# Patient Record
Sex: Female | Born: 1945 | Race: White | Hispanic: No | Smoking: Never smoker
Health system: Southern US, Community
[De-identification: ages and names within clinical notes are randomized; demographics above are authoritative.]

## PROBLEM LIST (undated history)

## (undated) DIAGNOSIS — M549 Dorsalgia, unspecified: Secondary | ICD-10-CM

## (undated) DIAGNOSIS — M2550 Pain in unspecified joint: Secondary | ICD-10-CM

## (undated) DIAGNOSIS — R112 Nausea with vomiting, unspecified: Secondary | ICD-10-CM

## (undated) DIAGNOSIS — E785 Hyperlipidemia, unspecified: Secondary | ICD-10-CM

## (undated) DIAGNOSIS — M1990 Unspecified osteoarthritis, unspecified site: Secondary | ICD-10-CM

## (undated) DIAGNOSIS — Z973 Presence of spectacles and contact lenses: Secondary | ICD-10-CM

## (undated) DIAGNOSIS — I4891 Unspecified atrial fibrillation: Secondary | ICD-10-CM

## (undated) DIAGNOSIS — R0602 Shortness of breath: Secondary | ICD-10-CM

## (undated) DIAGNOSIS — G4730 Sleep apnea, unspecified: Secondary | ICD-10-CM

## (undated) DIAGNOSIS — I480 Paroxysmal atrial fibrillation: Secondary | ICD-10-CM

## (undated) DIAGNOSIS — I10 Essential (primary) hypertension: Secondary | ICD-10-CM

## (undated) DIAGNOSIS — R5383 Other fatigue: Secondary | ICD-10-CM

## (undated) DIAGNOSIS — Z9889 Other specified postprocedural states: Secondary | ICD-10-CM

## (undated) DIAGNOSIS — M545 Low back pain, unspecified: Secondary | ICD-10-CM

## (undated) DIAGNOSIS — F419 Anxiety disorder, unspecified: Secondary | ICD-10-CM

## (undated) DIAGNOSIS — K5900 Constipation, unspecified: Secondary | ICD-10-CM

## (undated) DIAGNOSIS — G4733 Obstructive sleep apnea (adult) (pediatric): Principal | ICD-10-CM

## (undated) HISTORY — DX: Essential (primary) hypertension: I10

## (undated) HISTORY — DX: Shortness of breath: R06.02

## (undated) HISTORY — DX: Unspecified atrial fibrillation: I48.91

## (undated) HISTORY — DX: Sleep apnea, unspecified: G47.30

## (undated) HISTORY — DX: Obstructive sleep apnea (adult) (pediatric): G47.33

## (undated) HISTORY — PX: BUNIONECTOMY: SHX129

## (undated) HISTORY — DX: Unspecified osteoarthritis, unspecified site: M19.90

## (undated) HISTORY — DX: Hyperlipidemia, unspecified: E78.5

## (undated) HISTORY — DX: Pain in unspecified joint: M25.50

## (undated) HISTORY — DX: Dorsalgia, unspecified: M54.9

## (undated) HISTORY — PX: HAMMER TOE SURGERY: SHX385

## (undated) HISTORY — PX: COLONOSCOPY: SHX174

## (undated) HISTORY — DX: Low back pain, unspecified: M54.50

## (undated) HISTORY — DX: Other fatigue: R53.83

## (undated) HISTORY — DX: Anxiety disorder, unspecified: F41.9

## (undated) HISTORY — DX: Constipation, unspecified: K59.00

## (undated) HISTORY — DX: Paroxysmal atrial fibrillation: I48.0

---

## 1990-10-03 HISTORY — PX: CARPAL TUNNEL RELEASE: SHX101

## 1999-02-15 ENCOUNTER — Other Ambulatory Visit: Admission: RE | Admit: 1999-02-15 | Discharge: 1999-02-15 | Payer: Self-pay | Admitting: Gynecology

## 1999-03-29 ENCOUNTER — Other Ambulatory Visit: Admission: RE | Admit: 1999-03-29 | Discharge: 1999-03-29 | Payer: Self-pay | Admitting: Obstetrics and Gynecology

## 1999-07-03 ENCOUNTER — Emergency Department (HOSPITAL_COMMUNITY): Admission: EM | Admit: 1999-07-03 | Discharge: 1999-07-03 | Payer: Self-pay

## 2001-04-27 ENCOUNTER — Other Ambulatory Visit: Admission: RE | Admit: 2001-04-27 | Discharge: 2001-04-27 | Payer: Self-pay | Admitting: Obstetrics and Gynecology

## 2002-05-07 ENCOUNTER — Other Ambulatory Visit: Admission: RE | Admit: 2002-05-07 | Discharge: 2002-05-07 | Payer: Self-pay | Admitting: Obstetrics and Gynecology

## 2003-04-01 ENCOUNTER — Ambulatory Visit (HOSPITAL_BASED_OUTPATIENT_CLINIC_OR_DEPARTMENT_OTHER): Admission: RE | Admit: 2003-04-01 | Discharge: 2003-04-02 | Payer: Self-pay | Admitting: Dentistry

## 2003-10-04 HISTORY — PX: FOOT SURGERY: SHX648

## 2004-06-02 ENCOUNTER — Other Ambulatory Visit: Admission: RE | Admit: 2004-06-02 | Discharge: 2004-06-02 | Payer: Self-pay | Admitting: Obstetrics and Gynecology

## 2005-03-25 ENCOUNTER — Encounter: Admission: RE | Admit: 2005-03-25 | Discharge: 2005-03-25 | Payer: Self-pay | Admitting: Internal Medicine

## 2005-05-10 ENCOUNTER — Encounter: Admission: RE | Admit: 2005-05-10 | Discharge: 2005-05-10 | Payer: Self-pay | Admitting: Internal Medicine

## 2005-08-30 ENCOUNTER — Ambulatory Visit (HOSPITAL_COMMUNITY): Admission: RE | Admit: 2005-08-30 | Discharge: 2005-08-30 | Payer: Self-pay | Admitting: Orthopedic Surgery

## 2005-08-30 ENCOUNTER — Ambulatory Visit (HOSPITAL_BASED_OUTPATIENT_CLINIC_OR_DEPARTMENT_OTHER): Admission: RE | Admit: 2005-08-30 | Discharge: 2005-08-30 | Payer: Self-pay | Admitting: Orthopedic Surgery

## 2005-10-03 HISTORY — PX: CARPAL TUNNEL RELEASE: SHX101

## 2006-10-06 ENCOUNTER — Other Ambulatory Visit: Admission: RE | Admit: 2006-10-06 | Discharge: 2006-10-06 | Payer: Self-pay | Admitting: Obstetrics and Gynecology

## 2006-11-27 ENCOUNTER — Encounter: Admission: RE | Admit: 2006-11-27 | Discharge: 2006-11-27 | Payer: Self-pay | Admitting: Family Medicine

## 2008-05-06 ENCOUNTER — Other Ambulatory Visit: Admission: RE | Admit: 2008-05-06 | Discharge: 2008-05-06 | Payer: Self-pay | Admitting: Obstetrics and Gynecology

## 2009-04-09 ENCOUNTER — Ambulatory Visit: Payer: Self-pay | Admitting: Internal Medicine

## 2009-04-23 ENCOUNTER — Ambulatory Visit: Payer: Self-pay | Admitting: Internal Medicine

## 2009-04-23 ENCOUNTER — Encounter: Payer: Self-pay | Admitting: Internal Medicine

## 2009-04-24 ENCOUNTER — Encounter: Payer: Self-pay | Admitting: Internal Medicine

## 2011-02-18 NOTE — Op Note (Signed)
[name], [name]               ACCOUNT NO.:  [id number]   MEDICAL RECORD NO.:  [id number]          PATIENT TYPE:  AMB   LOCATION:  DSC                          FACILITY:  [facility]   PHYSICIAN:  Katy Fitch. Sypher, M.D. DATE OF BIRTH:  08/13/1946   DATE OF PROCEDURE:  08/30/2005  DATE OF DISCHARGE:                                 OPERATIVE REPORT   PREOPERATIVE DIAGNOSIS:  Entrapment neuropathy, left median nerve at carpal  tunnel.   POSTOPERATIVE DIAGNOSES:  1.  Entrapment neuropathy, left median nerve at carpal tunnel.  2.  Identification of significant synovitis.   OPERATION:  Release of left transverse carpal ligament.   OPERATIONS:  Katy Fitch. Sypher, M.D.   ASSISTANT:  Molly Maduro Dasnoit PA-C.   ANESTHESIA:  General by LMA, supervising anesthesiologist is Dr. Gelene Mink.   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  She is status post right carpal  tunnel release 15 years prior.  Recently she developed numbness in the left  hand and some issues with her cervical spine.   She was evaluated by Dr. 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   She had electrodiagnostic studies that confirmed left carpal tunnel  syndrome.   She was referred back for definitive care of her left carpal tunnel  syndrome.   PROCEDURE:  [name] is brought to the operating room and placed in  supine position upon the operating table.   Following the induction of general anesthesia by LMA technique, the left arm  was prepped with Betadine soap and solution and sterilely draped.   Following exsanguination of the left arm with an Esmarch bandage, the  arterial tourniquet on the proximal brachium was inflated to 230 mmHg.   The procedure commenced with a short incision in the line the ring finger in  the palm.  The subcutaneous tissues were  carefully divided to reveal the  palmar fascia.  This was split longitudinally to reveal the common sensory  branch of the median nerve.   These were followed back to the transverse carpal ligament, which was gently  sounded with a Pen[place] 4 elevator, followed by release of the ligament  along its ulnar border extending into the distal forearm.   This widely opened the carpal canal.   Bleeding points along margin of the released ligament were cauterized with  bipolar current, followed by repair of the skin with intradermal 3-0  Prolene.   A compressive dressing was applied with a volar plaster splint maintaining  the wrist in 5 degrees of dorsiflexion.   There were no apparent complications.   Felicia Franklin was noted to have a remarkable amount of synovial fluid within  the canal, suggesting an inflammatory synovitis.   We will provide her with this information postoperatively and encourage her  to seek further evaluation for possible rheumatologic predicament.      Katy Fitch Sypher, M.D.  Electronically Signed     RVS/MEDQ  D:  08/30/2005  T:  08/31/2005  Job:  [id number]

## 2011-02-18 NOTE — Op Note (Signed)
NAME:  Felicia Franklin, Felicia Franklin                         ACCOUNT NO.:  [id number]   MEDICAL RECORD NO.:  [id number]                   PATIENT TYPE:  AMB   LOCATION:  DSC                                  FACILITY:  [facility]   PHYSICIAN:  Leonides Grills, M.D.                  DATE OF BIRTH:  Feb 02, 1946   DATE OF PROCEDURE:  04/01/2003  DATE OF DISCHARGE:                                 OPERATIVE REPORT   PREOPERATIVE DIAGNOSES:  1. Left hallux valgus.  2. Left tight gastrocnemius.  3. Left second and third metatarsalgia.  4. Left second and third acquired hammertoes.   POSTOPERATIVE DIAGNOSES:  1. Left hallux valgus.  2. Left tight gastrocnemius.  3. Left second and third metatarsalgia.  4. Left second and third acquired hammertoes.   OPERATION:  1. Left chevron bunionectomy.  2. Left gastrocnemius release.  3. Left second Wilde shortening osteotomy.  4. Left third Wilde shortening osteotomy.  5. Left second and third toes metatarsal phalangeal joint dorsal     capsulotomies with collateral release.  6. Left second and third toes partial phalanx head resections.  7. Left second and third toes flexor digitorum longus to proximal phalanx     transfers.  8. Left second and third toes extensor digitorum brevis to extensor     digitorum longus transfers.   ANESTHESIA:  General endotracheal tube.   SURGEON:  Leonides Grills, M.D.   ASSISTANT:  Lianne Cure, P.A.-C.   ESTIMATED BLOOD LOSS:  Minimal.   TOURNIQUET TIME:  Approximately two hours.   COMPLICATIONS:  None.   DISPOSITION:  Stable to the postoperative recovery room.   INDICATIONS FOR PROCEDURE:  This is a 65 year old female who has had  longstanding forefoot pain that is interfering with her life, __________  despite conservative management.  She has consented for the above procedure.  All risks, which include infection, nerve or vessel injury, non-union,  malunion, hardware rotation, hardware failure, persistent pain,  worsening  pain, avascular necrosis, stiffness, arthritis, and possible fusion and  recurrence and deformity were all explained.  Questions were encouraged and  answered.   DESCRIPTION OF PROCEDURE:  The patient was brought to the operating room and  placed in the supine position. after adequate general endotracheal tube  anesthesia was administered, as well as Ancef 1 g IV piggyback.  The left  lower extremity was then prepped and draped in a sterile manner over a  proximally placed thigh tourniquet.  We started the procedure with a  longitudinal incision over the medial gastrocnemius muscle/tendinous  junction.  Dissection was carried down through the skin.  Hemostasis was  obtained.  The fascia was opened in line with the incision.  The conjoined  region between the gastrocnemius and soleus was then developed and soft  tissue was then elevated off of the posterior aspect of the gastrocnemius.  The sural nerve was identified and protected posteriorly.  Then with the  Mayo scissors, the gastrocnemius tendon was then released.  This had an  excellent release of the tight gastrocnemius.  The wound was copiously  irrigated with normal saline.  The subcutaneous was closed with #3-0 Vicryl.  The skin was closed with #4-0 Monocryl subcuticular stitch.  Steri-Strips  were applied.   We then gravity exsanguinated the left lower extremity.  The tourniquet was  elevated to 290 mmHg.  A longitudinal incision in the midline over the left  great toe MTP joint was then made.  Dissection was carried down through the  skin.  The neurovascular structures were identified and dissected out both  superiorly and inferiorly and protected throughout the remaining portion of  the procedure.  An L-shaped medial capsulotomy was then made.  Once this was  elevated, a simple bunionectomy was performed with the sagittal saw.  Once  this was done, the lateral capsule was then released through the joint using  a  Beaver blade.  Once this was done, we then found the center of the  metatarsal head medially and then a chevron osteotomy was made with 60  degrees between each limb.  Once this was done, and the soft tissue  protected throughout the osteotomy, the head was then translated laterally,  and then fixed with axial compression applied by my assistant with a 2.0 mm  fully-threaded cortical screw, using a 1.5 mm drill respectively, counter-  sinking the screw as well.  The redundant portion of the bone was then  removed with the sagittal saw, and the edges rounded off with the rongeur.  An x-ray was obtained and showed an excellent alignment and correction of  the bunion with sesamoids reduced.  We then advanced the capsule proximal  dorsal, and fixed this with #2-0 Vicryl, holding the toe in supination  varus.  Once this was done, the toe was in excellent alignment with an  excellent range of motion.  The sesamoids were reduced.   We then made a longitudinal incision over the second toe.  Dissection was  carried down through the skin.  Hemostasis was obtained.  The extensor  digitorum longus was tenotomized proximal medial, and the brevis distal  lateral, and retracted out of harm's way for later transfer.  The MTP joint  was then entered, and a dorsal capsulotomy with collateral release was then  performed meticulously with protection of the neurovascular structures both  medially and laterally.  The PIP joint was then entered, and collaterals  were released.  The proximal phalanx head was then skeletonized and excised  with the aid of a rongeur and a bone cutter.  We then went back to the MTP  joint, and with hyperflexion through the MTP joint which was very stiff, we  were able to perform a Wilde metatarsal shortening osteotomy with a stacked  sagittal saw.  Once this was done parallel to the plane of the foot, this  was then translated approximately 2 to 3 mm proximal, and fixed with a  [place] 12 mm screw.  This had excellent maintenance of the correction and  compression across the osteotomy.  The remaining ledge of bone was then  trimmed off with the rongeur.  We went back to the PIP joint.  A  longitudinal incision was then made within the PIP joint plantar plate.  The  FDL tendon was then identified and tenotomized as distal as possible.  Then  a 3.5 mm drill hole was placed in the  base of the proximal phalanx going  from dorsal to plantar.  We then through dorsal plantar threaded #3-0 PDS  suture, and then anchored this to the end of the FDL tendon.  This was then  pulled through the hole in the base of the proximal phalanx and then dorsal  plantar.  We then placed a double-ended trocar 4.5 K-wire antegrade through  the middle and distal phalanx, and then retrograde through the proximal  phalanx with the toe held reduced.  Then with the MTP joint held reduced,  the ankle in neutral dorsiflexion, and tension on the FDL tendon, the K-wire  was then fired across the MTP joint.  This had excellent purchase and  maintenance of the corrected toe.  We then transferred dorsally the EDP to  EDL with #3-0 PDS, as well as to the FDL tendon.   The same exact procedure through a separate incision was then performed on  the third toe.  At the end of the procedure the tourniquet was deflated.  All toes pinked up nicely.  Skin-relieving incisions were made around each K-  wire.  The K-wires were bent, cut, and capped.  Final x-rays were obtained  in the AP and lateral views and showed excellent alignment of the  osteotomies, as well as correction of the forefoot where it had balanced  configuration as well.  Once the toes pinked up nicely, we then closed all  the wounds with #4-0 nylon suture.  The toes were still maintained in pinked  position, bleeding at the tip of the toes as well, through the skin-  relieving incisions.  A sterile dressing was applied.  A Cam walker boot  was  applied.  The patient was stable to the postoperative recovery room.                                                 Leonides Grills, M.D.    PB/MEDQ  D:  04/01/2003  T:  04/01/2003  Job:  [id number]

## 2013-05-28 ENCOUNTER — Encounter (HOSPITAL_COMMUNITY): Payer: Self-pay | Admitting: Certified Registered Nurse Anesthetist

## 2013-05-28 ENCOUNTER — Encounter (HOSPITAL_COMMUNITY)
Admission: RE | Disposition: A | Discharge status: Home / Self Care | Payer: Self-pay | Source: Ambulatory Visit | Attending: Cardiology

## 2013-05-28 ENCOUNTER — Ambulatory Visit (HOSPITAL_COMMUNITY)
Admission: RE | Admit: 2013-05-28 | Discharge: 2013-05-28 | Disposition: A | Discharge status: Home / Self Care | Payer: Medicare Other | Source: Ambulatory Visit | Attending: Cardiology | Admitting: Cardiology

## 2013-05-28 ENCOUNTER — Ambulatory Visit: Admit: 2013-05-28 | Payer: Self-pay | Admitting: Cardiology

## 2013-05-28 DIAGNOSIS — I4891 Unspecified atrial fibrillation: Secondary | ICD-10-CM | POA: Insufficient documentation

## 2013-05-28 DIAGNOSIS — Z538 Procedure and treatment not carried out for other reasons: Secondary | ICD-10-CM | POA: Insufficient documentation

## 2013-05-28 SURGERY — CARDIOVERSION
Anesthesia: Monitor Anesthesia Care

## 2013-05-28 SURGERY — CANCELLED PROCEDURE

## 2013-06-05 ENCOUNTER — Encounter: Payer: Self-pay | Admitting: Pulmonary Disease

## 2013-06-06 ENCOUNTER — Encounter: Payer: Self-pay | Admitting: Pulmonary Disease

## 2013-06-06 ENCOUNTER — Ambulatory Visit (INDEPENDENT_AMBULATORY_CARE_PROVIDER_SITE_OTHER): Payer: Medicare Other | Admitting: Pulmonary Disease

## 2013-06-06 VITALS — BP 102/80 | HR 60 | Temp 97.3°F | Ht 63.5 in | Wt 211.4 lb

## 2013-06-06 DIAGNOSIS — R0683 Snoring: Secondary | ICD-10-CM

## 2013-06-06 DIAGNOSIS — G4733 Obstructive sleep apnea (adult) (pediatric): Secondary | ICD-10-CM | POA: Insufficient documentation

## 2013-06-06 DIAGNOSIS — R0609 Other forms of dyspnea: Secondary | ICD-10-CM

## 2013-06-06 NOTE — Patient Instructions (Signed)
Will arrange for sleep study Will call to arrange for follow up after sleep study reviewed 

## 2013-06-06 NOTE — Progress Notes (Signed)
Chief Complaint  Patient presents with  . SLEEP CONSULT    Referred by Dr Jacinto Halim. Epworth Score: 2    History of Present Illness: Felicia Franklin is a 67 y.o. female for evaluation of sleep problems.  She is followed by cardiology for hypertension and atrial fibrillation.  She has noted trouble falling asleep since she was started on eliquis.  She also snores while asleep, and feels tired during the day.  There was concern she could have sleep apnea, and she was referred for further assessment.  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  Since retirement her sleep/wake schedule has been more erratic.  She goes to sleep at between 10 and midnight.  She falls asleep quickly.  She wakes up several times to use the bathroom.  She gets out of bed at between 6 and 8 am.  She feels tired in the morning.  She denies morning headache.  She does not use anything to help her fall sleep or stay awake.  She has been told by her dentist that she grinds her teeth.  She drinks diet coke in the morning for the caffeine.  She notices problems with sinus congestion, but only at night.  As a result she has to breath through her mouth while she is asleep.  She denies sleep walking, sleep talking, or nightmares.  There is no history of restless legs.  She denies sleep hallucinations, sleep paralysis, or cataplexy.  The Epworth score is 2 out of 24.  Tests:   CRIMSON DUBBERLY  has a past medical history of Hyperlipidemia and Atrial fibrillation.  Clinton Quant  has past surgical history that includes Carpal tunnel release and Foot surgery (Left, 2005).  Prior to Admission medications   Medication Sig Start Date End Date Taking? Authorizing Provider  aliskiren (TEKTURNA) 300 MG tablet Take 300 mg by mouth daily.   Yes Historical Provider, MD  amLODipine (NORVASC) 10 MG tablet Take 10 mg by mouth daily.   Yes Historical Provider, MD  apixaban (ELIQUIS) 5 MG TABS tablet Take 5 mg by mouth 2 (two)  times daily.   Yes Historical Provider, MD  Misc Natural Products (OSTEO BI-FLEX JOINT SHIELD) TABS Take 1 tablet by mouth daily.   Yes Historical Provider, MD  Multiple Vitamins-Minerals (CENTRUM SILVER PO) Take 1 tablet by mouth daily.   Yes Historical Provider, MD  nebivolol (BYSTOLIC) 10 MG tablet Take 10 mg by mouth daily.   Yes Historical Provider, MD  sertraline (ZOLOFT) 100 MG tablet Take 100 mg by mouth daily.   Yes Historical Provider, MD  simvastatin (ZOCOR) 40 MG tablet Take 40 mg by mouth every evening.   Yes Historical Provider, MD  spironolactone (ALDACTONE) 25 MG tablet Take 25 mg by mouth daily.   Yes Historical Provider, MD  Vitamin D, Ergocalciferol, (DRISDOL) 50000 UNITS CAPS capsule Take 50,000 Units by mouth every 7 (seven) days.   Yes Historical Provider, MD    Allergies  Allergen Reactions  . Antifungal [Miconazole Nitrate] Rash    Her family history includes Leukemia in her father.  She  reports that she has never smoked. She has never used smokeless tobacco. She reports that  drinks alcohol. She reports that she does not use illicit drugs.  Review of Systems  Constitutional: Negative for fever and unexpected weight change.  HENT: Negative for ear pain, nosebleeds, congestion, sore throat, rhinorrhea, sneezing, trouble swallowing, dental problem, postnasal drip and sinus pressure.   Eyes: Negative  for redness and itching.  Respiratory: Positive for shortness of breath. Negative for cough, chest tightness and wheezing.   Cardiovascular: Positive for palpitations ( irregular heartbeats). Negative for leg swelling.  Gastrointestinal: Negative for nausea and vomiting.  Genitourinary: Negative for dysuria.  Musculoskeletal: Negative for joint swelling.  Skin: Negative for rash.  Neurological: Negative for headaches.  Hematological: Does not bruise/bleed easily.  Psychiatric/Behavioral: Negative for dysphoric mood. The patient is not nervous/anxious.    Physical  Exam:  General - No distress ENT - No sinus tenderness, no oral exudate, MP 3, scalloped tongue, 3+ tonsil, no LAN, no thyromegaly, TM clear, pupils equal/reactive Cardiac - s1s2 regular, no murmur, pulses symmetric Chest - No wheeze/rales/dullness, good air entry, normal respiratory excursion Back - No focal tenderness Abd - Soft, non-tender, no organomegaly, + bowel sounds Ext - No edema Neuro - Normal strength, cranial nerves intact Skin - No rashes Psych - Normal mood, and behavior

## 2013-06-06 NOTE — Assessment & Plan Note (Signed)
She has snoring, sleep disruption, and daytime sleepiness.  She has history of hypertension and atrial fibrillation.  She has recent overnight oximetry showing oxygen desaturation.  I am concerned she could have sleep apnea.  We discussed how sleep apnea can affect various health problems including risks for hypertension, cardiovascular disease, and diabetes.  We also discussed how sleep disruption can increase risks for accident, such as while driving.  Weight loss as a means of improving sleep apnea was also reviewed.  Additional treatment options discussed were CPAP therapy, oral appliance, and surgical intervention.  To further assess will arrange for in lab sleep study.

## 2013-06-06 NOTE — Progress Notes (Deleted)
  Subjective:    Patient ID: Felicia Franklin, female    DOB: [birth year], 67 y.o.   MRN: [id number]  HPI    Review of Systems  Constitutional: Negative for fever and unexpected weight change.  HENT: Negative for ear pain, nosebleeds, congestion, sore throat, rhinorrhea, sneezing, trouble swallowing, dental problem, postnasal drip and sinus pressure.   Eyes: Negative for redness and itching.  Respiratory: Positive for shortness of breath. Negative for cough, chest tightness and wheezing.   Cardiovascular: Positive for palpitations ( irregular heartbeats). Negative for leg swelling.  Gastrointestinal: Negative for nausea and vomiting.  Genitourinary: Negative for dysuria.  Musculoskeletal: Negative for joint swelling.  Skin: Negative for rash.  Neurological: Negative for headaches.  Hematological: Does not bruise/bleed easily.  Psychiatric/Behavioral: Negative for dysphoric mood. The patient is not nervous/anxious.        Objective:   Physical Exam        Assessment & Plan:

## 2013-06-11 ENCOUNTER — Encounter: Payer: Self-pay | Admitting: Pulmonary Disease

## 2013-07-10 ENCOUNTER — Ambulatory Visit
Admission: RE | Admit: 2013-07-10 | Discharge: 2013-07-10 | Disposition: A | Discharge status: Home / Self Care | Payer: Medicare Other | Source: Ambulatory Visit | Attending: Pain Medicine | Admitting: Pain Medicine

## 2013-07-10 ENCOUNTER — Other Ambulatory Visit: Payer: Self-pay | Admitting: Pain Medicine

## 2013-07-10 DIAGNOSIS — M25552 Pain in left hip: Secondary | ICD-10-CM

## 2013-07-10 DIAGNOSIS — M25551 Pain in right hip: Secondary | ICD-10-CM

## 2013-07-11 ENCOUNTER — Ambulatory Visit (HOSPITAL_BASED_OUTPATIENT_CLINIC_OR_DEPARTMENT_OTHER): Payer: Medicare Other | Attending: Pulmonary Disease

## 2013-07-11 VITALS — Ht 63.0 in | Wt 211.0 lb

## 2013-07-11 DIAGNOSIS — G4733 Obstructive sleep apnea (adult) (pediatric): Secondary | ICD-10-CM | POA: Insufficient documentation

## 2013-07-11 DIAGNOSIS — I10 Essential (primary) hypertension: Secondary | ICD-10-CM | POA: Insufficient documentation

## 2013-07-11 DIAGNOSIS — G4763 Sleep related bruxism: Secondary | ICD-10-CM | POA: Insufficient documentation

## 2013-07-11 DIAGNOSIS — I4891 Unspecified atrial fibrillation: Secondary | ICD-10-CM | POA: Insufficient documentation

## 2013-07-11 DIAGNOSIS — R0683 Snoring: Secondary | ICD-10-CM

## 2013-08-01 ENCOUNTER — Telehealth: Payer: Self-pay | Admitting: Pulmonary Disease

## 2013-08-01 NOTE — Telephone Encounter (Signed)
I do not see results in Dr Craige Cotta look at.  Results requested From [facility]-- faxed to [phone] ATTN: [place]/Dr [facility], raw data to be faxed to be read by Dr Craige Cotta.   Will forward to [place] to keep an eye out for these results.

## 2013-08-01 NOTE — Telephone Encounter (Signed)
Results received-- I have spoken with Florentina Addison about getting CY to result sleep study seeing that VS out of office and pt is requesting.  Florentina Addison has sleep study on her desk.   Will forward to Katie to follow up on and advise. Thanks.

## 2013-08-01 NOTE — Telephone Encounter (Signed)
I will keep a look out for the results. When they are received I will see if CY doesn't mind looking at them.

## 2013-08-01 NOTE — Telephone Encounter (Signed)
Pt aware results are being faxed.  Apologized for the delay.   Lillia Abed, since Dr Craige Cotta not back in office until 08/15/13, we may have to get another sleep physician to read these ASAP for pt is anxious. Thanks!

## 2013-08-01 NOTE — Telephone Encounter (Signed)
According to the schedule VS is not off just out of the office; I have given the sleep study (with approval from CY as well) to Lillia Abed so she can reach VS about this patient. There is not an interpretation in the computer to go by at this time.

## 2013-08-01 NOTE — Telephone Encounter (Signed)
I have paged Dr. Craige Cotta to address this. When he does, I will contact the pt with her results.

## 2013-08-05 DIAGNOSIS — R0609 Other forms of dyspnea: Secondary | ICD-10-CM

## 2013-08-05 DIAGNOSIS — R0989 Other specified symptoms and signs involving the circulatory and respiratory systems: Secondary | ICD-10-CM

## 2013-08-05 NOTE — Telephone Encounter (Signed)
Can you please address this pt's sleep study. Thanks.

## 2013-08-05 NOTE — Telephone Encounter (Signed)
PSG 07/11/13 >> AHI 22.8, SpO2 82%, PLMI 9.6.  Will have my nurse inform pt that sleep study shows moderate sleep apnea.  Option at this time is : 1) arrange for auto CPAP and follow up in 2 months to assess response to therapy, or 2) arrange for follow up first to discuss results in more detail before starting therapy.  If pt is agreeable to start therapy, then send order to arrange for auto CPAP range 5 to 15 cm H2O with heated humidity and mask of choice.

## 2013-08-06 NOTE — Telephone Encounter (Signed)
Pt is aware of sleep study results. She has some questions before she agrees to use the CPAP machine. ROV has been scheduled for 08/23/13 at 10am.

## 2013-08-06 NOTE — Procedures (Signed)
NAME:  Felicia Franklin, VANALSTINE NO.:  [id number]  MEDICAL RECORD NO.:  [id number]          PATIENT TYPE:  OUT  LOCATION:  SLEEP CENTER                 FACILITY:  [facility]  PHYSICIAN:  Coralyn Helling, MD        DATE OF BIRTH:  October 01, 1946  DATE OF STUDY:  07/11/2013                           NOCTURNAL POLYSOMNOGRAM  REFERRING PHYSICIAN:  Coralyn Helling, MD  INDICATION FOR STUDY:  [name] is a 67 year old female, who has a history of hypertension and atrial fibrillation.  She also has symptoms of snoring, sleep disruption, and daytime sleepiness.  She recently had a overnight oximetry done which showed a significant oxygen desaturation.  She is referred to the sleep lab for further evaluation of hypersomnia with obstructive sleep apnea.  Height is 5 feet 3 inches, weight is 211 pounds, BMI is 37, neck size is 14 inches.  EPWORTH SLEEPINESS SCORE:  2.  MEDICATIONS:  Reviewed in her chart.  SLEEP ARCHITECTURE:  Total recording time was 402 minutes. Total sleep time was 249 minutes.  Sleep efficiency was 62%. Sleep latency was 67 minutes. REM latency was 294 minutes.  The patient was observed in all stages of sleep and slept predominantly in the non-supine position.  She appeared to have difficulty with sleep initiation and sleep maintenance due to respiratory events.  RESPIRATORY DATA:  The average respiratory rate was 16.  Loud snoring was noted by the technician.  The overall apnea/hypopnea index was 22.8. There were 10 central apneic events.  There was 1 mixed respiratory event.  The remainder of the events were obstructive in nature.  Her REM apnea-hypopnea index was 60. Her non-REM apnea-hypopnea index was 20.5.  OXYGEN DATA:  The baseline oxygenation was 93%.  The oxygen saturation nadir was 82%.  The study was conducted without the use of supplemental oxygen.  CARDIAC DATA:  The average heart rate was 66 and the rhythm strip showed atrial  fibrillation.  MOVEMENT-PARASOMNIA:  The periodic limb movement index was 9.6.  The patient was noted to have 1 episode of bruxism.  The patient had 1 restroom trip.  IMPRESSIONS-RECOMMENDATIONS:  This study shows evidence for moderate obstructive sleep apnea with an apnea/hypopnea index of 22.8, and oxygen saturation nadir of 82%.  She had 1 episode of bruxism noted.  In addition, a weight reduction and additional therapeutic interventions could include CPAP therapy, oral appliance, or surgical intervention.     Coralyn Helling, MD Diplomat, American Board of Sleep Medicine    VS/MEDQ  D:  08/05/2013 12:24:38  T:  08/06/2013 02:47:22  Job:  [id number]

## 2013-08-23 ENCOUNTER — Encounter: Payer: Self-pay | Admitting: Pulmonary Disease

## 2013-08-23 ENCOUNTER — Ambulatory Visit (INDEPENDENT_AMBULATORY_CARE_PROVIDER_SITE_OTHER): Payer: Medicare Other | Admitting: Pulmonary Disease

## 2013-08-23 VITALS — BP 122/64 | HR 60 | Ht 63.0 in | Wt 205.0 lb

## 2013-08-23 DIAGNOSIS — G4733 Obstructive sleep apnea (adult) (pediatric): Secondary | ICD-10-CM

## 2013-08-23 MED ORDER — ESZOPICLONE 1 MG PO TABS: 1.0000 mg | ORAL_TABLET | Freq: Every evening | ORAL | Status: DC | PRN

## 2013-08-23 NOTE — Assessment & Plan Note (Signed)
She has moderate sleep apnea.  She has history of hypertension and atrial fibrillation.  I have reviewed the recent sleep study results with the patient.  We discussed how sleep apnea can affect various health problems including risks for hypertension, cardiovascular disease, and diabetes.  We also discussed how sleep disruption can increase risks for accident, such as while driving.  Weight loss as a means of improving sleep apnea was also reviewed.  Additional treatment options discussed were CPAP therapy, oral appliance, and surgical intervention.  She is concerned about whether she can tolerate CPAP, but is willing to try this. Will arrange for auto CPAP set up.  Have given her script for lunesta to use as needed to assist with initial CPAP set up >> explained that sleep aide is not long term medication.  Also discussed mask fit issues.  Emphasized that treating sleep apnea is important for blood pressure control and may help with treating atrial fibrillation.

## 2013-08-23 NOTE — Patient Instructions (Signed)
Will arrange for CPAP set up Can try using lunesta 1 mg nightly before sleeping while getting used to CPAP machine Follow up in 2 months

## 2013-08-23 NOTE — Progress Notes (Signed)
Chief Complaint  Patient presents with  . Sleep Apnea    Discuss sleep apnea therapy    History of Present Illness: Felicia Franklin is a 67 y.o. female with OSA.  She is here to review her sleep study results.  This showed moderate sleep apnea.   TESTS: PSG 07/11/13 >> AHI 22.8, SpO2 82%, PLMI 9.6.   Felicia Franklin  has a past medical history of Hyperlipidemia; Atrial fibrillation; and Hypertension.  Felicia Franklin  has past surgical history that includes Carpal tunnel release and Foot surgery (Left, 2005).  Prior to Admission medications   Medication Sig Start Date End Date Taking? Authorizing Provider  aliskiren (TEKTURNA) 300 MG tablet Take 300 mg by mouth daily.   Yes Historical Provider, MD  amLODipine (NORVASC) 10 MG tablet Take 10 mg by mouth daily.   Yes Historical Provider, MD  apixaban (ELIQUIS) 5 MG TABS tablet Take 5 mg by mouth 2 (two) times daily.   Yes Historical Provider, MD  Misc Natural Products (OSTEO BI-FLEX JOINT SHIELD) TABS Take 1 tablet by mouth daily.   Yes Historical Provider, MD  Multiple Vitamins-Minerals (CENTRUM SILVER PO) Take 1 tablet by mouth daily.   Yes Historical Provider, MD  nebivolol (BYSTOLIC) 10 MG tablet Take 10 mg by mouth daily.   Yes Historical Provider, MD  sertraline (ZOLOFT) 100 MG tablet Take 100 mg by mouth daily.   Yes Historical Provider, MD  simvastatin (ZOCOR) 40 MG tablet Take 40 mg by mouth every evening.   Yes Historical Provider, MD  spironolactone (ALDACTONE) 25 MG tablet Take 25 mg by mouth daily.   Yes Historical Provider, MD  Vitamin D, Ergocalciferol, (DRISDOL) 50000 UNITS CAPS capsule Take 50,000 Units by mouth every 7 (seven) days.   Yes Historical Provider, MD    Allergies  Allergen Reactions  . Antifungal [Miconazole Nitrate] Rash     Physical Exam:  General - No distress ENT - No sinus tenderness, no oral exudate, no LAN, MP 3, scalloped tongue, 3+ tonsil Cardiac - s1s2 regular, no murmur Chest - No  wheeze/rales/dullness Back - No focal tenderness Abd - Soft, non-tender Ext - No edema Neuro - Normal strength Skin - No rashes Psych - normal mood, and behavior   Assessment/Plan:  Coralyn Helling, MD [place] [facility]/Sleep Pager:  [phone]

## 2013-08-27 ENCOUNTER — Ambulatory Visit: Payer: Medicare Other | Attending: Neurosurgery

## 2013-08-27 DIAGNOSIS — R5381 Other malaise: Secondary | ICD-10-CM | POA: Insufficient documentation

## 2013-08-27 DIAGNOSIS — M545 Low back pain, unspecified: Secondary | ICD-10-CM | POA: Insufficient documentation

## 2013-08-27 DIAGNOSIS — IMO0001 Reserved for inherently not codable concepts without codable children: Secondary | ICD-10-CM | POA: Insufficient documentation

## 2013-09-02 ENCOUNTER — Ambulatory Visit: Payer: Medicare Other | Attending: Neurosurgery | Admitting: Physical Therapy

## 2013-09-02 DIAGNOSIS — M545 Low back pain, unspecified: Secondary | ICD-10-CM | POA: Insufficient documentation

## 2013-09-02 DIAGNOSIS — R5381 Other malaise: Secondary | ICD-10-CM | POA: Insufficient documentation

## 2013-09-02 DIAGNOSIS — IMO0001 Reserved for inherently not codable concepts without codable children: Secondary | ICD-10-CM | POA: Insufficient documentation

## 2013-09-04 ENCOUNTER — Ambulatory Visit: Payer: Medicare Other | Admitting: Physical Therapy

## 2013-09-09 ENCOUNTER — Ambulatory Visit: Payer: Medicare Other | Admitting: Physical Therapy

## 2013-09-16 ENCOUNTER — Ambulatory Visit: Payer: Medicare Other

## 2013-09-30 ENCOUNTER — Ambulatory Visit: Payer: Medicare Other | Admitting: Physical Therapy

## 2013-10-02 ENCOUNTER — Ambulatory Visit: Payer: Medicare Other | Admitting: Physical Therapy

## 2013-10-07 ENCOUNTER — Encounter: Payer: Self-pay | Admitting: Obstetrics and Gynecology

## 2013-10-07 ENCOUNTER — Telehealth: Payer: Self-pay | Admitting: Obstetrics and Gynecology

## 2013-10-07 ENCOUNTER — Ambulatory Visit: Payer: Self-pay | Admitting: Obstetrics and Gynecology

## 2013-10-07 ENCOUNTER — Ambulatory Visit: Payer: Medicare Other | Attending: Neurosurgery | Admitting: Physical Therapy

## 2013-10-07 DIAGNOSIS — I10 Essential (primary) hypertension: Secondary | ICD-10-CM | POA: Insufficient documentation

## 2013-10-07 DIAGNOSIS — R5381 Other malaise: Secondary | ICD-10-CM | POA: Insufficient documentation

## 2013-10-07 DIAGNOSIS — IMO0001 Reserved for inherently not codable concepts without codable children: Secondary | ICD-10-CM | POA: Insufficient documentation

## 2013-10-07 DIAGNOSIS — F419 Anxiety disorder, unspecified: Secondary | ICD-10-CM | POA: Insufficient documentation

## 2013-10-07 DIAGNOSIS — M545 Low back pain, unspecified: Secondary | ICD-10-CM | POA: Insufficient documentation

## 2013-10-07 NOTE — Telephone Encounter (Signed)
No

## 2013-10-07 NOTE — Telephone Encounter (Signed)
As i was [facility] she came in at 3:55 thinking her appt was at 3:45. She went upstairs by mistake. I told her she would have to reschedule and nothing for Edward JollySilva was available until February. York SpanielSaid it was okay to see another provider for her scheduled tomorrow with Farrel GobbleLathrop. Do you want to [facility] fee?

## 2013-10-08 ENCOUNTER — Ambulatory Visit (INDEPENDENT_AMBULATORY_CARE_PROVIDER_SITE_OTHER): Payer: Medicare Other | Admitting: Gynecology

## 2013-10-08 ENCOUNTER — Encounter: Payer: Self-pay | Admitting: Gynecology

## 2013-10-08 VITALS — BP 149/75 | HR 64 | Resp 18 | Ht 62.0 in | Wt 209.0 lb

## 2013-10-08 DIAGNOSIS — I480 Paroxysmal atrial fibrillation: Secondary | ICD-10-CM | POA: Insufficient documentation

## 2013-10-08 DIAGNOSIS — G4730 Sleep apnea, unspecified: Secondary | ICD-10-CM | POA: Insufficient documentation

## 2013-10-08 DIAGNOSIS — E559 Vitamin D deficiency, unspecified: Secondary | ICD-10-CM

## 2013-10-08 DIAGNOSIS — Z01419 Encounter for gynecological examination (general) (routine) without abnormal findings: Secondary | ICD-10-CM

## 2013-10-08 NOTE — Progress Notes (Signed)
68 y.o. divorced Caucasian female   G1P1001 here for annual exam. Pt reports menses are absent.   She does not report post-menopasual bleeding.  Pt recently retired.  Pt without gyn complaints.  Patient's last menstrual period was 10/03/1998.          Sexually active: no  The current method of family planning is post menopausal status.    Exercising: yes  Core strengthening 2x/qd Last pap: 05/12/09 Negative  Abnormal PAP: no Mammogram: 08/14/13 Bi-Rads 1 BSE: no Colonoscopy: 2010- Normal DEXA: 2014 Alcohol: no Tobacco: no  Labs: Geoffry Paradiseichard Aronson  Health Maintenance  Topic Date Due  . Tetanus/tdap  12/17/1965  . Colonoscopy  12/17/1996  . Zostavax  12/18/2006  . Pneumococcal Polysaccharide Vaccine Age 67 And Over  12/18/2011  . Influenza Vaccine  05/03/2014  . Mammogram  08/15/2015    Family History  Problem Relation Age of Onset  . Leukemia Father   . Colon cancer Father   . Diabetes Father   . Prostate cancer Father   . Hypertension Father   . Breast cancer Mother [id number]  . Hypertension Mother     Patient Active Problem List   Diagnosis Date Noted  . Intermittent atrial fibrillation   . Sleep apnea   . Anxiety   . Hypertension   . OSA (obstructive sleep apnea) 06/06/2013    Past Medical History  Diagnosis Date  . Hyperlipidemia   . Atrial fibrillation   . OSA (obstructive sleep apnea) 06/06/2013  . Anxiety   . Hypertension   . Intermittent atrial fibrillation   . Sleep apnea     Past Surgical History  Procedure Laterality Date  . Carpal tunnel release Left 2007  . Foot surgery Left 2005  . Carpal tunnel release Right 1992    Allergies: Antifungal  Current Outpatient Prescriptions  Medication Sig Dispense Refill  . aliskiren (TEKTURNA) 300 MG tablet Take 300 mg by mouth daily.      Marland Kitchen. amLODipine (NORVASC) 10 MG tablet Take 10 mg by mouth daily.      Marland Kitchen. apixaban (ELIQUIS) 5 MG TABS tablet Take 5 mg by mouth 2 (two) times daily.      .  Glucosamine-Chondroitin (MOVE FREE PO) Take by mouth.      . Multiple Vitamins-Minerals (CENTRUM SILVER PO) Take 1 tablet by mouth daily.      . nebivolol (BYSTOLIC) 10 MG tablet Take 10 mg by mouth daily.      . predniSONE (DELTASONE) 2.5 MG tablet Take 2.5 mg by mouth daily with breakfast.      . sertraline (ZOLOFT) 100 MG tablet Take 100 mg by mouth daily.      . simvastatin (ZOCOR) 40 MG tablet Take 40 mg by mouth every evening.      Marland Kitchen. spironolactone (ALDACTONE) 25 MG tablet Take 25 mg by mouth daily.      . Vitamin D, Ergocalciferol, (DRISDOL) 50000 UNITS CAPS capsule Take 50,000 Units by mouth every 7 (seven) days.       No current facility-administered medications for this visit.    ROS: Pertinent items are noted in HPI.  Exam:    BP 149/75  Pulse 64  Resp 18  Ht 5\' 2"  (1.575 m)  Wt 209 lb (94.802 kg)  BMI 38.22 kg/m2  LMP 10/03/1998 Weight change: @WEIGHTCHANGE @ Last 3 height recordings:  Ht Readings from Last 3 Encounters:  10/08/13 5\' 2"  (1.575 m)  08/23/13 5\' 3"  (1.6 m)  07/11/13 5\' 3"  (1.6 m)  General appearance: alert, cooperative and appears stated age Head: Normocephalic, without obvious abnormality, atraumatic Neck: no adenopathy, no carotid bruit, no JVD, supple, symmetrical, trachea midline and thyroid not enlarged, symmetric, no tenderness/mass/nodules Lungs: clear to auscultation bilaterally Breasts: normal appearance, no masses or tenderness Heart: regular rate and rhythm, S1, S2 normal, no murmur, click, rub or gallop Abdomen: soft, non-tender; bowel sounds normal; no masses,  no organomegaly Extremities: extremities normal, atraumatic, no cyanosis or edema Skin: Skin color, texture, turgor normal. No rashes or lesions Lymph nodes: Cervical, supraclavicular, and axillary nodes normal. no inguinal nodes palpated Neurologic: Grossly normal   Pelvic: External genitalia:  no lesions              Urethra: normal appearing urethra with no masses, tenderness  or lesions              Bartholins and Skenes: normal                 Vagina: normal appearing vagina with normal color and discharge, no lesions              Cervix: normal appearance              Pap taken: no        Bimanual Exam:  Uterus:  uterus is normal size, shape, consistency and nontender                                      Adnexa:    normal adnexa in size, nontender and no masses                                      Rectovaginal: Confirms                                      Anus:  normal sphincter tone, no lesions  A: well woman Vit d def     P: mammogram counseled on breast self exam, mammography screening, adequate intake of calcium and vitamin D, diet and exercise Check vit d level,  No rx today return annually or prn Discussed PAP guideline changes, importance of weight bearing exercises, calcium, vit D and balanced diet.  An After Visit Summary was printed and given to the patient.

## 2013-10-08 NOTE — Patient Instructions (Signed)

## 2013-10-09 ENCOUNTER — Ambulatory Visit: Payer: Medicare Other | Admitting: Physical Therapy

## 2013-10-09 ENCOUNTER — Telehealth: Payer: Self-pay | Admitting: *Deleted

## 2013-10-09 LAB — VITAMIN D 25 HYDROXY (VIT D DEFICIENCY, FRACTURES): Vit D, 25-Hydroxy: 56 ng/mL (ref 30–89)

## 2013-10-09 NOTE — Telephone Encounter (Signed)
Left Message To Call Back  

## 2013-10-09 NOTE — Telephone Encounter (Signed)
Message copied by Lorraine LaxSHAW, Aliana Kreischer J on Wed Oct 09, 2013  1:52 PM ------      Message from: Douglass RiversLATHROP, TRACY      Created: Wed Oct 09, 2013  8:28 AM       Inform level is normal, new protocol ok 600-800/d ------

## 2013-10-11 ENCOUNTER — Encounter: Payer: Self-pay | Admitting: Pulmonary Disease

## 2013-10-11 NOTE — Telephone Encounter (Signed)
Patient notified see labs 

## 2013-10-14 ENCOUNTER — Ambulatory Visit: Payer: Medicare Other | Admitting: Physical Therapy

## 2013-10-16 ENCOUNTER — Ambulatory Visit: Payer: Medicare Other | Admitting: Physical Therapy

## 2013-10-17 ENCOUNTER — Ambulatory Visit (INDEPENDENT_AMBULATORY_CARE_PROVIDER_SITE_OTHER): Payer: Medicare Other | Admitting: Pulmonary Disease

## 2013-10-17 ENCOUNTER — Encounter: Payer: Self-pay | Admitting: Pulmonary Disease

## 2013-10-17 VITALS — BP 118/76 | HR 72 | Temp 97.6°F | Ht 62.0 in | Wt 211.0 lb

## 2013-10-17 DIAGNOSIS — G4733 Obstructive sleep apnea (adult) (pediatric): Secondary | ICD-10-CM

## 2013-10-17 NOTE — Progress Notes (Signed)
Chief Complaint  Patient presents with  . Sleep Apnea    Currently using CPAP every night for at least 7-9 hours per night. Denies problems with the machine, mask or pressure.    History of Present Illness: Felicia Franklin Hypes is a 68 y.o. female with OSA.  She has been surprised about how well she has been able to tolerate using CPAP.  She is not having any difficulty with her mask fit.  She did have trouble getting through airport security with her CPAP.  TESTS: PSG 07/11/13 >> AHI 22.8, SpO2 82%, PLMI 9.6. Auto CPAP 08/29/13 to 09/26/13 >> used on 29 of 29 nights with average 8 hrs 15 min.  Average AHI 3.4 with median CPAP 12 cm H2O and 95 th percentile CPAP 14 cm H2O.  Clinton Quantarla K Ponce  has a past medical history of Hyperlipidemia; Atrial fibrillation; OSA (obstructive sleep apnea) (06/06/2013); Anxiety; Hypertension; Intermittent atrial fibrillation; and Sleep apnea.  Clinton Quantarla K Dipierro  has past surgical history that includes Carpal tunnel release (Left, 2007); Foot surgery (Left, 2005); and Carpal tunnel release (Right, 1992).  Prior to Admission medications   Medication Sig Start Date End Date Taking? Authorizing Provider  aliskiren (TEKTURNA) 300 MG tablet Take 300 mg by mouth daily.   Yes Historical Provider, MD  amLODipine (NORVASC) 10 MG tablet Take 10 mg by mouth daily.   Yes Historical Provider, MD  apixaban (ELIQUIS) 5 MG TABS tablet Take 5 mg by mouth 2 (two) times daily.   Yes Historical Provider, MD  Misc Natural Products (OSTEO BI-FLEX JOINT SHIELD) TABS Take 1 tablet by mouth daily.   Yes Historical Provider, MD  Multiple Vitamins-Minerals (CENTRUM SILVER PO) Take 1 tablet by mouth daily.   Yes Historical Provider, MD  nebivolol (BYSTOLIC) 10 MG tablet Take 10 mg by mouth daily.   Yes Historical Provider, MD  sertraline (ZOLOFT) 100 MG tablet Take 100 mg by mouth daily.   Yes Historical Provider, MD  simvastatin (ZOCOR) 40 MG tablet Take 40 mg by mouth every evening.   Yes  Historical Provider, MD  spironolactone (ALDACTONE) 25 MG tablet Take 25 mg by mouth daily.   Yes Historical Provider, MD  Vitamin D, Ergocalciferol, (DRISDOL) 50000 UNITS CAPS capsule Take 50,000 Units by mouth every 7 (seven) days.   Yes Historical Provider, MD    Allergies  Allergen Reactions  . Triple Antibiotic [Bacitracin-Neomycin-Polymyxin]      Physical Exam:  General - No distress ENT - No sinus tenderness, no oral exudate, no LAN, MP 3, scalloped tongue, 3+ tonsil Cardiac - s1s2 regular, no murmur Chest - No wheeze/rales/dullness Back - No focal tenderness Abd - Soft, non-tender Ext - No edema Neuro - Normal strength Skin - No rashes Psych - normal mood, and behavior   Assessment/Plan:  Coralyn HellingVineet Ritik Stavola, MD [place] [facility]Critical [facility]/Sleep Pager:  [phone]

## 2013-10-17 NOTE — Assessment & Plan Note (Signed)
She is compliant with CPAP and reports benefit.  Again emphasized the role sleep apnea therapy can play in controlling her atrial fibrillation and hypertension.

## 2013-10-17 NOTE — Patient Instructions (Signed)
Follow up in 6 months 

## 2013-11-20 ENCOUNTER — Encounter (HOSPITAL_BASED_OUTPATIENT_CLINIC_OR_DEPARTMENT_OTHER): Payer: Self-pay | Admitting: *Deleted

## 2013-11-20 NOTE — Progress Notes (Signed)
To come in for bmet-called dr Nadara Eatongangi for notes-hx af-was to be cardioverted 8/14 but went out of AF Will bring cpap dos and use post op

## 2013-11-21 ENCOUNTER — Encounter (HOSPITAL_BASED_OUTPATIENT_CLINIC_OR_DEPARTMENT_OTHER)
Admission: RE | Admit: 2013-11-21 | Discharge: 2013-11-21 | Disposition: A | Discharge status: Home / Self Care | Payer: Medicare Other | Source: Ambulatory Visit | Attending: Orthopedic Surgery | Admitting: Orthopedic Surgery

## 2013-11-21 ENCOUNTER — Other Ambulatory Visit: Payer: Self-pay | Admitting: Orthopedic Surgery

## 2013-11-21 DIAGNOSIS — Z01818 Encounter for other preprocedural examination: Secondary | ICD-10-CM | POA: Insufficient documentation

## 2013-11-21 DIAGNOSIS — Z01812 Encounter for preprocedural laboratory examination: Secondary | ICD-10-CM | POA: Insufficient documentation

## 2013-11-21 LAB — BASIC METABOLIC PANEL
BUN: 19 mg/dL (ref 6–23)
CO2: 24 meq/L (ref 19–32)
CREATININE: 0.68 mg/dL (ref 0.50–1.10)
Calcium: 10.1 mg/dL (ref 8.4–10.5)
Chloride: 104 mEq/L (ref 96–112)
GFR calc Af Amer: >90 mL/min (ref >90)
GFR calc non Af Amer: 89 mL/min — ABNORMAL LOW (ref >90)
GLUCOSE: 91 mg/dL (ref 70–99)
Potassium: 5 mEq/L (ref 3.7–5.3)
SODIUM: 143 meq/L (ref 137–147)

## 2013-11-25 NOTE — H&P (Addendum)
  Felicia Franklin is an 68 y.o. female.   Chief Complaint: c/o chronic and progressive right thumb CMC pain HPI: Gerrit FriendsDarla Franklin returns for follow-up examination of her right thumb CMC arthrosis.  She has used meloxicam and diclofenac without relief.  She has not had satisfaction after the most recent injection as she has had with prior injections  Past Medical History  Diagnosis Date  . Hyperlipidemia   . Atrial fibrillation   . Anxiety   . Hypertension   . Intermittent atrial fibrillation   . Wears glasses   . OSA (obstructive sleep apnea) 06/06/2013    using a cpap  . Sleep apnea   . Arthritis   . PONV (postoperative nausea and vomiting)     severe-also can get agitated waking up    Past Surgical History  Procedure Laterality Date  . Carpal tunnel release Left 2007  . Foot surgery Left 2005    bunionectomy-osteotomy  . Carpal tunnel release Right 1992  . Colonoscopy      Family History  Problem Relation Age of Onset  . Leukemia Father   . Colon cancer Father   . Diabetes Father   . Prostate cancer Father   . Hypertension Father   . Breast cancer Mother [id number]  . Hypertension Mother    Social History:  reports that she has never smoked. She has never used smokeless tobacco. She reports that she drinks alcohol. She reports that she does not use illicit drugs.  Allergies:  Allergies  Allergen Reactions  . Triple Antibiotic [Bacitracin-Neomycin-Polymyxin] Rash    No prescriptions prior to admission    No results found for this or any previous visit (from the past 48 hour(s)).  No results found.   Pertinent items are noted in HPI.  Height 5\' 2"  (1.575 m), weight 95.255 kg (210 lb), last menstrual period 10/03/1998.  General appearance: alert Head: Normocephalic, without obvious abnormality Neck: supple, symmetrical, trachea midline Resp: clear to auscultation bilaterally Cardio: irregularly irregular rhythm GI: normal findings: bowel sounds normal Extremities:  .  She has Eaton stage III CMC arthrosis.  She has marked pain with CMC translation and grind.  She is stable at the MP joint and has large osteophytes at the IP joint.  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  Plan: TO the OR for right thumb CMC arthroplasty.The procedure, risks,benefits and post-op course were discussed with the patient at length and they were in agreement with the plan.  She has held her Eliquis for 48 hours as instructed.  DASNOIT,Ashtyn Freilich J 11/25/2013, 9:23 AM  H&P documentation: 11/26/2013  -History and Physical Reviewed  -Patient has been re-examined  -No change in the plan of care  Wyn Forsterobert V Chamika Cunanan Jr, MD

## 2013-11-26 ENCOUNTER — Encounter (HOSPITAL_BASED_OUTPATIENT_CLINIC_OR_DEPARTMENT_OTHER)
Admission: RE | Disposition: A | Discharge status: Home / Self Care | Payer: Self-pay | Source: Ambulatory Visit | Attending: Orthopedic Surgery

## 2013-11-26 ENCOUNTER — Ambulatory Visit (HOSPITAL_BASED_OUTPATIENT_CLINIC_OR_DEPARTMENT_OTHER): Payer: Medicare Other | Admitting: Anesthesiology

## 2013-11-26 ENCOUNTER — Encounter (HOSPITAL_BASED_OUTPATIENT_CLINIC_OR_DEPARTMENT_OTHER): Payer: Self-pay | Admitting: Anesthesiology

## 2013-11-26 ENCOUNTER — Encounter (HOSPITAL_BASED_OUTPATIENT_CLINIC_OR_DEPARTMENT_OTHER): Payer: Medicare Other | Admitting: Anesthesiology

## 2013-11-26 ENCOUNTER — Ambulatory Visit (HOSPITAL_BASED_OUTPATIENT_CLINIC_OR_DEPARTMENT_OTHER)
Admission: RE | Admit: 2013-11-26 | Discharge: 2013-11-26 | Disposition: A | Discharge status: Home / Self Care | Payer: Medicare Other | Source: Ambulatory Visit | Attending: Orthopedic Surgery | Admitting: Orthopedic Surgery

## 2013-11-26 DIAGNOSIS — Z7901 Long term (current) use of anticoagulants: Secondary | ICD-10-CM | POA: Insufficient documentation

## 2013-11-26 DIAGNOSIS — I4891 Unspecified atrial fibrillation: Secondary | ICD-10-CM | POA: Insufficient documentation

## 2013-11-26 DIAGNOSIS — Z881 Allergy status to other antibiotic agents status: Secondary | ICD-10-CM | POA: Insufficient documentation

## 2013-11-26 DIAGNOSIS — M19049 Primary osteoarthritis, unspecified hand: Secondary | ICD-10-CM | POA: Insufficient documentation

## 2013-11-26 DIAGNOSIS — F411 Generalized anxiety disorder: Secondary | ICD-10-CM | POA: Insufficient documentation

## 2013-11-26 DIAGNOSIS — I10 Essential (primary) hypertension: Secondary | ICD-10-CM | POA: Insufficient documentation

## 2013-11-26 DIAGNOSIS — E785 Hyperlipidemia, unspecified: Secondary | ICD-10-CM | POA: Insufficient documentation

## 2013-11-26 DIAGNOSIS — G4733 Obstructive sleep apnea (adult) (pediatric): Secondary | ICD-10-CM | POA: Insufficient documentation

## 2013-11-26 HISTORY — DX: Nausea with vomiting, unspecified: R11.2

## 2013-11-26 HISTORY — DX: Unspecified osteoarthritis, unspecified site: M19.90

## 2013-11-26 HISTORY — PX: CARPOMETACARPAL (CMC) FUSION OF THUMB: SHX6290

## 2013-11-26 HISTORY — DX: Presence of spectacles and contact lenses: Z97.3

## 2013-11-26 HISTORY — DX: Other specified postprocedural states: Z98.890

## 2013-11-26 LAB — POCT HEMOGLOBIN-HEMACUE: HEMOGLOBIN: 14.2 g/dL (ref 12.0–15.0)

## 2013-11-26 SURGERY — CARPOMETACARPAL (CMC) FUSION OF THUMB
Anesthesia: General | Site: Wrist | Laterality: Right

## 2013-11-26 MED ORDER — OXYCODONE HCL 5 MG PO TABS
ORAL_TABLET | ORAL | Status: AC
  Filled 2013-11-26: qty 1

## 2013-11-26 MED ORDER — PROPOFOL 10 MG/ML IV BOLUS: INTRAVENOUS | Status: DC | PRN

## 2013-11-26 MED ORDER — HYDROMORPHONE HCL PF 1 MG/ML IJ SOLN
0.2500 mg | INTRAMUSCULAR | Status: DC | PRN
Start: 2013-11-26 — End: 2013-11-26
  Administered 2013-11-26 (×2): 0.5 mg via INTRAVENOUS

## 2013-11-26 MED ORDER — MIDAZOLAM HCL 5 MG/5ML IJ SOLN
INTRAMUSCULAR | Status: DC | PRN
  Administered 2013-11-26: 2 mg via INTRAVENOUS

## 2013-11-26 MED ORDER — CEFAZOLIN SODIUM-DEXTROSE 2-3 GM-% IV SOLR
2.0000 g | Freq: Once | INTRAVENOUS | Status: AC
  Administered 2013-11-26: 2 g via INTRAVENOUS

## 2013-11-26 MED ORDER — OXYCODONE HCL 5 MG/5ML PO SOLN: 5.0000 mg | Freq: Once | ORAL | Status: AC | PRN

## 2013-11-26 MED ORDER — LACTATED RINGERS IV SOLN
INTRAVENOUS | Status: DC
  Administered 2013-11-26 (×2): via INTRAVENOUS

## 2013-11-26 MED ORDER — PHENYLEPHRINE HCL 10 MG/ML IJ SOLN
INTRAMUSCULAR | Status: DC | PRN
  Administered 2013-11-26 (×2): 100 ug via INTRAVENOUS

## 2013-11-26 MED ORDER — CEPHALEXIN 500 MG PO CAPS: 500.0000 mg | ORAL_CAPSULE | Freq: Three times a day (TID) | ORAL | Status: DC

## 2013-11-26 MED ORDER — DEXAMETHASONE SODIUM PHOSPHATE 10 MG/ML IJ SOLN
INTRAMUSCULAR | Status: DC | PRN
  Administered 2013-11-26: 10 mg via INTRAVENOUS

## 2013-11-26 MED ORDER — MIDAZOLAM HCL 2 MG/2ML IJ SOLN
INTRAMUSCULAR | Status: AC
  Filled 2013-11-26: qty 2

## 2013-11-26 MED ORDER — LIDOCAINE HCL (CARDIAC) 20 MG/ML IV SOLN
INTRAVENOUS | Status: DC | PRN
  Administered 2013-11-26: 40 mg via INTRAVENOUS

## 2013-11-26 MED ORDER — FENTANYL CITRATE 0.05 MG/ML IJ SOLN
INTRAMUSCULAR | Status: AC
  Filled 2013-11-26: qty 2

## 2013-11-26 MED ORDER — FENTANYL CITRATE 0.05 MG/ML IJ SOLN
INTRAMUSCULAR | Status: AC
Start: 2013-11-26 — End: 2013-11-26
  Filled 2013-11-26: qty 4

## 2013-11-26 MED ORDER — MIDAZOLAM HCL 2 MG/2ML IJ SOLN: 1.0000 mg | INTRAMUSCULAR | Status: DC | PRN

## 2013-11-26 MED ORDER — EPHEDRINE SULFATE 50 MG/ML IJ SOLN
INTRAMUSCULAR | Status: DC | PRN
  Administered 2013-11-26: 10 mg via INTRAVENOUS

## 2013-11-26 MED ORDER — LIDOCAINE HCL 2 % IJ SOLN
INTRAMUSCULAR | Status: DC | PRN
  Administered 2013-11-26: 7 mL

## 2013-11-26 MED ORDER — OXYCODONE-ACETAMINOPHEN 7.5-325 MG PO TABS
1.0000 | ORAL_TABLET | ORAL | Status: DC | PRN
Start: 2013-11-26 — End: 2014-10-23

## 2013-11-26 MED ORDER — ONDANSETRON HCL 4 MG/2ML IJ SOLN
INTRAMUSCULAR | Status: DC | PRN
  Administered 2013-11-26 (×2): 4 mg via INTRAVENOUS

## 2013-11-26 MED ORDER — FENTANYL CITRATE 0.05 MG/ML IJ SOLN
INTRAMUSCULAR | Status: DC | PRN
  Administered 2013-11-26: 50 ug via INTRAVENOUS
  Administered 2013-11-26 (×2): 25 ug via INTRAVENOUS

## 2013-11-26 MED ORDER — GLYCOPYRROLATE 0.2 MG/ML IJ SOLN
INTRAMUSCULAR | Status: DC | PRN
  Administered 2013-11-26: 0.4 mg via INTRAVENOUS

## 2013-11-26 MED ORDER — OXYCODONE HCL 5 MG PO TABS
5.0000 mg | ORAL_TABLET | Freq: Once | ORAL | Status: AC | PRN
  Administered 2013-11-26: 5 mg via ORAL

## 2013-11-26 MED ORDER — PROPOFOL 10 MG/ML IV BOLUS
INTRAVENOUS | Status: DC | PRN
  Administered 2013-11-26: 150 mg via INTRAVENOUS
  Administered 2013-11-26: 50 mg via INTRAVENOUS

## 2013-11-26 MED ORDER — FENTANYL CITRATE 0.05 MG/ML IJ SOLN: 50.0000 ug | INTRAMUSCULAR | Status: DC | PRN

## 2013-11-26 MED ORDER — HYDROMORPHONE HCL PF 1 MG/ML IJ SOLN
INTRAMUSCULAR | Status: AC
  Filled 2013-11-26: qty 1

## 2013-11-26 MED ORDER — CHLORHEXIDINE GLUCONATE 4 % EX LIQD: 60.0000 mL | Freq: Once | CUTANEOUS | Status: DC

## 2013-11-26 MED ORDER — CEFAZOLIN SODIUM-DEXTROSE 2-3 GM-% IV SOLR
INTRAVENOUS | Status: AC
  Filled 2013-11-26: qty 50

## 2013-11-26 SURGICAL SUPPLY — 71 items
BANDAGE ADH SHEER 1  50/CT (GAUZE/BANDAGES/DRESSINGS) IMPLANT
BANDAGE COBAN STERILE 2 (GAUZE/BANDAGES/DRESSINGS) IMPLANT
BANDAGE ELASTIC 3 VELCRO ST LF (GAUZE/BANDAGES/DRESSINGS) IMPLANT
BLADE MINI RND TIP GREEN BEAV (BLADE) ×2 IMPLANT
BLADE SURG 15 STRL LF DISP TIS (BLADE) ×1 IMPLANT
BLADE SURG 15 STRL SS (BLADE) ×3
BNDG CMPR 9X4 STRL LF SNTH (GAUZE/BANDAGES/DRESSINGS) ×1
BNDG CMPR MD 5X2 ELC HKLP STRL (GAUZE/BANDAGES/DRESSINGS)
BNDG COHESIVE 3X5 TAN STRL LF (GAUZE/BANDAGES/DRESSINGS) IMPLANT
BNDG CONFORM 3 STRL LF (GAUZE/BANDAGES/DRESSINGS) IMPLANT
BNDG ELASTIC 2 VLCR STRL LF (GAUZE/BANDAGES/DRESSINGS) IMPLANT
BNDG ESMARK 4X9 LF (GAUZE/BANDAGES/DRESSINGS) ×3 IMPLANT
BNDG GAUZE ELAST 4 BULKY (GAUZE/BANDAGES/DRESSINGS) ×3 IMPLANT
BRUSH SCRUB EZ PLAIN DRY (MISCELLANEOUS) ×3 IMPLANT
CLOSURE WOUND 1/2 X4 (GAUZE/BANDAGES/DRESSINGS)
CORDS BIPOLAR (ELECTRODE) ×3 IMPLANT
COVER MAYO STAND STRL (DRAPES) ×3 IMPLANT
COVER TABLE BACK 60X90 (DRAPES) ×3 IMPLANT
CUFF TOURNIQUET SINGLE 18IN (TOURNIQUET CUFF) IMPLANT
CUFF TOURNIQUET SINGLE 24IN (TOURNIQUET CUFF) ×2 IMPLANT
DECANTER SPIKE VIAL GLASS SM (MISCELLANEOUS) IMPLANT
DRAPE EXTREMITY T 121X128X90 (DRAPE) ×3 IMPLANT
DRAPE SURG 17X23 STRL (DRAPES) ×3 IMPLANT
GAUZE XEROFORM 1X8 LF (GAUZE/BANDAGES/DRESSINGS) IMPLANT
GLOVE BIO SURGEON STRL SZ 6.5 (GLOVE) ×1 IMPLANT
GLOVE BIO SURGEONS STRL SZ 6.5 (GLOVE) ×1
GLOVE BIOGEL M STRL SZ7.5 (GLOVE) ×3 IMPLANT
GLOVE BIOGEL PI IND STRL 7.0 (GLOVE) IMPLANT
GLOVE BIOGEL PI IND STRL 8 (GLOVE) ×1 IMPLANT
GLOVE BIOGEL PI INDICATOR 7.0 (GLOVE) ×2
GLOVE BIOGEL PI INDICATOR 8 (GLOVE) ×2
GLOVE ORTHO TXT STRL SZ7.5 (GLOVE) ×3 IMPLANT
GOWN STRL REUS W/ TWL LRG LVL3 (GOWN DISPOSABLE) ×1 IMPLANT
GOWN STRL REUS W/ TWL XL LVL3 (GOWN DISPOSABLE) ×2 IMPLANT
GOWN STRL REUS W/TWL LRG LVL3 (GOWN DISPOSABLE) ×3
GOWN STRL REUS W/TWL XL LVL3 (GOWN DISPOSABLE) ×6
K-WIRE .035X4 (WIRE) IMPLANT
LOOP VESSEL MAXI BLUE (MISCELLANEOUS) IMPLANT
NDL HYPO 25X1 1.5 SAFETY (NEEDLE) IMPLANT
NEEDLE 27GAX1X1/2 (NEEDLE) ×2 IMPLANT
NEEDLE HYPO 25X1 1.5 SAFETY (NEEDLE) IMPLANT
NS IRRIG 1000ML POUR BTL (IV SOLUTION) ×3 IMPLANT
PACK BASIN DAY SURGERY FS (CUSTOM PROCEDURE TRAY) ×3 IMPLANT
PAD CAST 3X4 CTTN HI CHSV (CAST SUPPLIES) IMPLANT
PADDING CAST ABS 4INX4YD NS (CAST SUPPLIES)
PADDING CAST ABS COTTON 4X4 ST (CAST SUPPLIES) ×1 IMPLANT
PADDING CAST COTTON 3X4 STRL (CAST SUPPLIES)
PADDING UNDERCAST 2  STERILE (CAST SUPPLIES) ×3 IMPLANT
SLEEVE SCD COMPRESS KNEE MED (MISCELLANEOUS) ×2 IMPLANT
SPLINT PLASTER CAST XFAST 3X15 (CAST SUPPLIES) IMPLANT
SPLINT PLASTER XTRA FASTSET 3X (CAST SUPPLIES)
SPONGE GAUZE 4X4 12PLY (GAUZE/BANDAGES/DRESSINGS) ×3 IMPLANT
STOCKINETTE 4X48 STRL (DRAPES) ×3 IMPLANT
STRIP CLOSURE SKIN 1/2X4 (GAUZE/BANDAGES/DRESSINGS) IMPLANT
SUT ETHIBOND 3-0 V-5 (SUTURE) ×3 IMPLANT
SUT ETHILON 5 0 P 3 18 (SUTURE)
SUT MERSILENE 4 0 P 3 (SUTURE) IMPLANT
SUT NYLON ETHILON 5-0 P-3 1X18 (SUTURE) IMPLANT
SUT PROLENE 3 0 PS 2 (SUTURE) ×3 IMPLANT
SUT PROLENE 4 0 P 3 18 (SUTURE) IMPLANT
SUT SILK 4 0 PS 2 (SUTURE) ×2 IMPLANT
SUT VIC AB 4-0 P-3 18XBRD (SUTURE) ×1 IMPLANT
SUT VIC AB 4-0 P3 18 (SUTURE) ×3
SYR 3ML 23GX1 SAFETY (SYRINGE) IMPLANT
SYR BULB 3OZ (MISCELLANEOUS) ×3 IMPLANT
SYR CONTROL 10ML LL (SYRINGE) ×3 IMPLANT
TOWEL OR 17X24 6PK STRL BLUE (TOWEL DISPOSABLE) ×4 IMPLANT
TRAY DSU PREP LF (CUSTOM PROCEDURE TRAY) ×3 IMPLANT
TUBE CONNECTING 20'X1/4 (TUBING) ×1
TUBE CONNECTING 20X1/4 (TUBING) ×2 IMPLANT
UNDERPAD 30X30 INCONTINENT (UNDERPADS AND DIAPERS) ×3 IMPLANT

## 2013-11-26 NOTE — Anesthesia Postprocedure Evaluation (Signed)
  Anesthesia Post-op Note  Patient: Felicia Franklin  Procedure(s) Performed: Procedure(s): CARPOMETACARPAL (CMC) FUSION OF THUMB (Right)  Patient Location: PACU  Anesthesia Type:General  Level of Consciousness: awake and alert   Airway and Oxygen Therapy: Patient Spontanous Breathing  Post-op Pain: moderate  Post-op Assessment: Post-op Vital signs reviewed, Patient's Cardiovascular Status Stable and Respiratory Function Stable  Post-op Vital Signs: Reviewed  Filed Vitals:   11/26/13 1215  BP: 133/60  Pulse: 73  Temp:   Resp: 13    Complications: No apparent anesthesia complications

## 2013-11-26 NOTE — Discharge Instructions (Addendum)
[facility] Instructions Hand Surgery  Wound Care: Keep your hand elevated above the level of your heart.  Do not allow it to dangle by your side.  Keep the dressing dry and do not remove it unless your doctor advises you to do so.  He will usually change it at the time of your post-op visit.  Moving your fingers is advised to stimulate circulation but will depend on the site of your surgery.  If you have a splint applied, your doctor will advise you regarding movement.  Activity: Do not drive or operate machinery today.  Rest today and then you may return to your normal activity and work as indicated by your physician.  Diet:  Drink liquids today or eat a light diet.  You may resume a regular diet tomorrow.    General expectations: Pain for two to three days. Fingers may become slightly swollen.  Call your doctor if any of the following occur: Severe pain not relieved by pain medication. Elevated temperature. Dressing soaked with blood. Inability to move fingers. White or bluish color to fingers.  RESUME ELIQUIS MEDICATION IN 24 HOURS ON 11/27/2013  ELEVATE RIGHT HAND FOR TWO DAYS   Post Anesthesia Home Care Instructions  Activity: Get plenty of rest for the remainder of the day. A responsible adult should stay with you for 24 hours following the procedure.  For the next 24 hours, DO NOT: -Drive a car -Advertising copywriterperate machinery -Drink alcoholic beverages -Take any medication unless instructed by your physician -Make any legal decisions or sign important papers.  Meals: Start with liquid foods such as gelatin or soup. Progress to regular foods as tolerated. Avoid greasy, spicy, heavy foods. If nausea and/or vomiting occur, drink only clear liquids until the nausea and/or vomiting subsides. Call your physician if vomiting continues.  Special Instructions/Symptoms: Your throat may feel dry or sore from the anesthesia or the breathing tube placed in your throat during surgery. If this  causes discomfort, gargle with warm salt water. The discomfort should disappear within 24 hours.

## 2013-11-26 NOTE — Anesthesia Preprocedure Evaluation (Signed)
Anesthesia Evaluation  Patient identified by MRN, date of birth, ID band Patient awake    Reviewed: Allergy & Precautions, H&P , NPO status , Patient's Chart, lab work & pertinent test results, reviewed documented beta blocker date and time   History of Anesthesia Complications (+) PONV, Emergence Delirium and history of anesthetic complications  Airway Mallampati: III TM Distance: >3 FB Neck ROM: Full    Dental no notable dental hx. (+) Teeth Intact, Dental Advisory Given   Pulmonary sleep apnea and Continuous Positive Airway Pressure Ventilation ,  breath sounds clear to auscultation  Pulmonary exam normal       Cardiovascular hypertension, On Medications and On Home Beta Blockers Rhythm:Regular Rate:Normal     Neuro/Psych negative neurological ROS  negative psych ROS   GI/Hepatic negative GI ROS, Neg liver ROS,   Endo/Other  negative endocrine ROS  Renal/GU negative Renal ROS  negative genitourinary   Musculoskeletal   Abdominal   Peds  Hematology negative hematology ROS (+)   Anesthesia Other Findings   Reproductive/Obstetrics negative OB ROS                           Anesthesia Physical Anesthesia Plan  ASA: III  Anesthesia Plan: General   Post-op Pain Management:    Induction: Intravenous  Airway Management Planned: LMA  Additional Equipment:   Intra-op Plan:   Post-operative Plan: Extubation in OR  Informed Consent: I have reviewed the patients History and Physical, chart, labs and discussed the procedure including the risks, benefits and alternatives for the proposed anesthesia with the patient or authorized representative who has indicated his/her understanding and acceptance.   Dental advisory given  Plan Discussed with: CRNA  Anesthesia Plan Comments:         Anesthesia Quick Evaluation

## 2013-11-26 NOTE — Brief Op Note (Signed)
11/26/2013  11:20 AM  PATIENT:  Felicia Franklin  68 y.o. female  PRE-OPERATIVE DIAGNOSIS:  RIGHT THUMB CMC ARTHROSIS  POST-OPERATIVE DIAGNOSIS:  RIGHT THUMB CMC ARTHROSIS  PROCEDURE:  TRAPEZIUM EXCISION, KNOTTED FCR TENDON INTERPOSITION ARTHROPLASTY, TRANSFER OF APL PALMAR SLIPS TO THENAR MUSCLE FASCIA  SURGEON:  Surgeon(s) and Role:    * Wyn Forsterobert V Dametri Ozburn Jr., MD - Primary  PHYSICIAN ASSISTANT:   ASSISTANTS: Mallory Shirkobert Dasnoit,P.A-C   ANESTHESIA:   general  EBL:  Total I/O In: 1500 [I.V.:1500] Out: -   BLOOD ADMINISTERED:none  DRAINS: none   LOCAL MEDICATIONS USED:  LIDOCAINE   SPECIMEN:  No Specimen  DISPOSITION OF SPECIMEN:  N/A  COUNTS:  YES  TOURNIQUET:  * Missing tourniquet times found for documented tourniquets in log:  [id number] * Total Tourniquet Time Documented: Upper Arm (Right) - 30 minutes Total: Upper Arm (Right) - 30 minutes   DICTATION: .Other Dictation: Dictation Number [id number]  PLAN OF CARE: Discharge to home after PACU  PATIENT DISPOSITION:  PACU - hemodynamically stable.   Delay start of Pharmacological VTE agent (>24hrs) due to surgical blood loss or risk of bleeding: not applicable

## 2013-11-26 NOTE — Op Note (Signed)
[id number] 

## 2013-11-26 NOTE — Anesthesia Procedure Notes (Signed)
Procedure Name: LMA Insertion Date/Time: 11/26/2013 9:52 AM Performed by: Genevieve NorlanderLINKA, Erma Joubert Pre-anesthesia Checklist: Patient identified, Emergency Drugs available, Suction available, Patient being monitored and Timeout performed Patient Re-evaluated:Patient Re-evaluated prior to inductionOxygen Delivery Method: Circle System Utilized Preoxygenation: Pre-oxygenation with 100% oxygen Intubation Type: IV induction Ventilation: Mask ventilation without difficulty LMA: LMA inserted LMA Size: 3.0 Number of attempts: 1 Airway Equipment and Method: bite block Placement Confirmation: positive ETCO2 and breath sounds checked- equal and bilateral Tube secured with: Tape Dental Injury: Teeth and Oropharynx as per pre-operative assessment

## 2013-11-26 NOTE — Op Note (Signed)
[name], [name] NO.:  [id number]  MEDICAL RECORD NO.:  [id number]  LOCATION:                                 FACILITY:  PHYSICIAN:  Katy Fitch. Nimisha Rathel, M.D. 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  OPERATION: 1. Resection of right trapezium with synovectomy and removal of loose     bodies from the right thumb carpometacarpal joint. 2. A 50% distally-based flexor carpi radialis tendon graft for a     knotted interposition arthroplasty with the primary slip of the     abductor pollicis longus. 3. Transfer of palmar slip of the abductor pollicis longus to the     thenar muscles to augment palmar abduction of the thumb.  Also     tenolysis of extensor pollicis brevis and adductor pollicis longus     tendons due to the intertwining of tendon slips proximally with     incidental release of first dorsal compartment.  OPERATING SURGEON:  Katy Fitch. Amoria Mclees, M.D.  ASSISTANT:  Marveen Reeks. Dasnoit, PA-C.  ANESTHESIA:  General by LMA supplemented by a lidocaine wrist block.  SUPERVISING ANESTHESIOLOGIST:  Zenon Mayo, MD.  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  She has had a series of steroid injections with splinting and for a while obtained satisfactory pain control.  She has now reached an end stage of arthritis where she has been unsuccessful in relieving her pain with medication, activity modification, splinting, and steroid injection.  Therefore, she requested that we proceed with  interposition arthroplasty.  We had a detailed discussion in the fall of 2014, describing a knotted interposition arthroplasty technique, which we have been performing since April 2014.  She decided to schedule surgery at this time.  She has had paroxysmal atrial fibrillation and is being followed by Dr. Jacinto Halim of cardiology.  She has been on Eliquis.  She saw Dr. Jacinto Halim for preoperative clearance and was advised to stop her Eliquis for 48 hours prior to surgery.  She was noted to be in sinus rhythm in the holding area and during surgery.  We have advised her to hold her Eliquis for at least 24 hours following surgery to prevent postoperative bleeding.  After detailed informed consent, she is brought to the operating room at this time.  PROCEDURE IN DETAIL:  Felicia Franklin was brought to room #2 of the [facility] and placed in supine position upon the operating table.  Following detailed anesthesia informed consent by Dr. Sampson Goon, general anesthesia by LMA technique was recommended and accepted.  Due to her history of anticoagulation a regional block was not recommended by Dr. Sampson Goon.  In room #2 under Dr. Jarrett Ables direct supervision, general anesthesia by LMA technique was induced.  She was provided 2 g of Ancef and IV prophylactic antibiotic.  Her left hand and arm had been marked in the holding area per protocol as the proper surgical site, and detailed informed consent had been completed with Ronaldo Miyamoto and her daughter in the holding area by myself. Questions were invited and answered in detail.  The right hand and arm were then prepped with Betadine soap and solution, sterilely draped.  A pneumatic tourniquet was applied proximal to right brachium and set at 220 mmHg.  Following routine Betadine scrub and paint, sterile stockinette and impervious arthroscopy drapes were applied followed by exsanguination of the right hand and arm with an Esmarch  bandage and inflation of the arterial tourniquet to 220 mmHg. Routine surgical time-out was accomplished followed by proceeding with reconstruction of her right thumb CMC joint.  A Wagner curvilinear incision was fashioned with a proximal volar Brunner zigzag to expose the flexor carpi radialis, the radial artery branches, and the region of the trapezium.  The thenar muscles were elevated and a full-thickness flap was created with the proximal origin of the thenar muscles and the capsule of the [facility] joint, elevated off the anterior surface of the trapezium.  The trapezium was circumferentially dissected taking care to identify and gently retract the radial superficial sensory branches and the lateral antebrachial cutaneous nerve branches.  The trapezium was exposed subperiosteally and the radial artery was identified.  The trapezium was then removed with a rongeur and osteotome dissection, piecemeal.  Complete synovectomy was accomplished followed by removal of multiple loose bodies from between the index and thumb metacarpals in the recess.  After synovectomy was accomplished.  Meticulous hemostasis was achieved with bipolar cautery due to her Eliquis anticoagulation.  The ulnar half of the flexor carpi radialis was harvested at the musculotendinous junction and brought distally with a Carroll tendon passing forceps.  This was passed into the cavity created by trapezium resection and the tendon was split within 5 mm of its insertion at the base of the 2nd metacarpal.  Then, performed an exploration of 1st dorsal compartment, release of the compartment, and inventorying the tendon.  There was intertwining between the abductor pollicis longus and extensor pollicis brevis.  We tenolysed and freed the extensor pollicis brevis and also identified 3 slips of the abductor pollicis longus.  The primary slip was dorsal.  This was used as the interposition arthroplasty and the 2 volar slips were  used for tendon transfer to the thenar musculature to augment and palmar abduction postoperatively.  We then performed a knotted tendon interposition arthroplasty with a triple Windsor knot style as described by Dr. Marny Lowenstein.  An excellent tendon knot was achieved.  The tail of the tendon was then brought deep into the wound with a grasping suture technique sewn through the insertion of the flexor carpi radialis at the base of the second metacarpal.  The capsule was then repaired anatomically, over the interposed tendon Material, to the cuff of capsule remaining on the scaphoid.  The 2 palmar slips of the abductor pollicis longus were then woven into the thenar muscle fascia and secured with multiple mattress sutures of 3-0 Ethibond.  A very satisfactory construct with the thumb and palmar abduction was achieved.  The wound was then irrigated and repaired with subcutaneous 4-0 Vicryl and intradermal 3-0 Prolene.  A 2% lidocaine was infiltrated for postoperative comfort.  The tourniquet was increased to 250 mmHg part way through the surgery when we noted this factors.  Blood pressure systolic elevated to 160 for few moments.  We re-exsanguinated the arm,  released the tourniquet momentarily and then reinflated the tourniquet 250 mmHg.  There were no apparent complications.  Ms. Abigail Miyamotohacker tolerated the surgery and anesthesia well.  She was placed in a compressive dressing with a forearm based thumb spica splint.  She will be discharged to care of her family with prescriptions for Percocet 7.5 mg 1 p.o. q.4-6 hours p.r.n. pain, #30 tablets without refill and also Keflex 500 mg 1 p.o. q.8 hours x4 days as prophylactic antibiotic.  She will not take any nonsteroidal medication as she will resume her Eliquis 24 hours.     Katy Fitchobert V. Ikey Omary, M.D.     RVS/MEDQ  D:  11/26/2013  T:  11/26/2013  Job:  [id number]  cc:   Pamella PertJagadeesh R. Ganji, MD

## 2013-11-26 NOTE — Transfer of Care (Signed)
Immediate Anesthesia Transfer of Care Note  Patient: Felicia Franklin  Procedure(s) Performed: Procedure(s): CARPOMETACARPAL (CMC) FUSION OF THUMB (Right)  Patient Location: PACU  Anesthesia Type:General  Level of Consciousness: awake, sedated and patient cooperative  Airway & Oxygen Therapy: Patient Spontanous Breathing and Patient connected to face mask oxygen  Post-op Assessment: Report given to PACU RN and Post -op Vital signs reviewed and stable  Post vital signs: Reviewed and stable  Complications: No apparent anesthesia complications

## 2013-11-27 ENCOUNTER — Encounter (HOSPITAL_BASED_OUTPATIENT_CLINIC_OR_DEPARTMENT_OTHER): Payer: Self-pay | Admitting: Orthopedic Surgery

## 2013-12-10 ENCOUNTER — Other Ambulatory Visit: Payer: Self-pay | Admitting: *Deleted

## 2013-12-10 NOTE — Telephone Encounter (Signed)
[facility].  Last AEX 09/14/2012 Last refill 09/14/2012 x 1 year Next appt 08/14/2015  Please advise.

## 2013-12-11 MED ORDER — SERTRALINE HCL 100 MG PO TABS: 100.0000 mg | ORAL_TABLET | Freq: Every day | ORAL | Status: DC

## 2014-04-16 ENCOUNTER — Encounter: Payer: Self-pay | Admitting: Internal Medicine

## 2014-06-14 ENCOUNTER — Encounter: Payer: Self-pay | Admitting: Internal Medicine

## 2014-06-23 ENCOUNTER — Encounter: Payer: Medicare Other | Attending: Internal Medicine | Admitting: Dietician

## 2014-06-23 ENCOUNTER — Encounter: Payer: Self-pay | Admitting: Dietician

## 2014-06-23 VITALS — Ht 63.5 in | Wt 217.3 lb

## 2014-06-23 DIAGNOSIS — E669 Obesity, unspecified: Secondary | ICD-10-CM | POA: Insufficient documentation

## 2014-06-23 DIAGNOSIS — Z6838 Body mass index (BMI) 38.0-38.9, adult: Secondary | ICD-10-CM | POA: Insufficient documentation

## 2014-06-23 DIAGNOSIS — Z713 Dietary counseling and surveillance: Secondary | ICD-10-CM | POA: Insufficient documentation

## 2014-06-23 NOTE — Patient Instructions (Addendum)
-  Reduce portions of starches  -Have sandwich in a lettuce wrap or have 1/2 bread removed  -Avoid having potato chips with sandwich (try having fruit or nuts with sandwich)  -Fill up on non-starchy vegetables  -Veggies are great raw or cooked, fresh or frozen!  -Find new ways of preparing them   -Add veggies to any dish  -Continue exercise regimen  -Keep working on portion control -Pack away leftovers in a tupperware before you start eating -After 1 portion of dinner, go ahead and have your Luna popsicle  -Have 1/2 sandwich  -Avoid keeping baked goods in the house  Goal weight: 117-130 lbs  Healthy weight loss is 1-2 pounds per weight   -"Mindless Eating" by Lynnell Grain

## 2014-06-23 NOTE — Progress Notes (Signed)
  Medical Nutrition Therapy:  Appt start time: 1400 end time:  1500.   Assessment:  Primary concerns today: "Felicia Franklin" is here today to discuss her obesity. She states that she tried Phen/Fen in the early 90s and it worked for her by giving her "artificial willpower" while following the American Heart Association diet. She reports that her weight has been up and down throughout her life. She loses weight on diets but then gains it back plus some. She has tried [employer], [employer], Phen/Fen, and Qsymia. She is a retired Journalist, newspaper, retired 1 year ago. Spends her mornings at the [facility] doing water walking, water yoga, chair yoga and aqua arthritis classes (sometimes does 3 classes in 1 morning). Started and has been adding more classes since April 2015. She will then run errands and pick up a sandwich while she is out. Felicia Franklin states that she likes to cook but has limited space in her kitchen and prefers simple recipes. However, she will bake occasionally. She lives alone. Has sleep apnea and uses CPAP machine. She identifies that her portion sizes are large and she often chooses starchy vegetables.     Preferred Learning Style:   No preference indicated   Learning Readiness:  Contemplating   MEDICATIONS: see list   DIETARY INTAKE:  Usual eating pattern includes 3 meals and 0 snacks per day.  Avoided foods include fish, beets, cooked greens, organ meats.    24-hr recall:  B ( AM): egg whites with low fat cheese, fruit, and maybe cereal, and Diet Coke  Snk ( AM): none  L ( PM): club or roast beef sandwich from [employer] or the [employer] Snk ( PM): none usually D ( PM): frozen pasta, chicken or pork chops or Polish sausage with baked potato, rice, and many starchy vegetables or spaghetti Snk ( PM): dessert: Luna ice cream bar  Beverages: Diet Coke, sparkling water, occasionally fruit juice  Usual physical activity: water aerobics for 1-3 hours 3 days a  week  Estimated energy needs: 1400-1600 calories  Progress Towards Goal(s):  In progress.   Nutritional Diagnosis:  [place]-3.3 Overweight/obesity As related to large portion sizes and excessive carbohydrate intake.  As evidenced by BMI 38.    Intervention:  Nutrition counseling provided. -Reduce portions of starches  -Have sandwich in a lettuce wrap or have 1/2 bread removed  -Avoid having potato chips with sandwich (try having fruit or nuts with sandwich) -Fill up on non-starchy vegetables  -Veggies are great raw or cooked, fresh or frozen!  -Find new ways of preparing them   -Add veggies to any dish -Continue exercise regimen -Keep working on portion control -Pack away leftovers in a tupperware before you start eating -After 1 portion of dinner, go ahead and have your Luna popsicle  -Have 1/2 sandwich -Avoid keeping baked goods in the house Goal weight: 117-130 lbs  Healthy weight loss is 1-2 pounds per weight  -"Mindless Eating" by Lynnell Grain  Teaching Method Utilized:  Visual Auditory  Handouts given during visit include:  Nutritional Strategies for Weight Loss  Barriers to learning/adherence to lifestyle change: none  Demonstrated degree of understanding via:  Teach Back   Monitoring/Evaluation:  Dietary intake, exercise, and body weight prn.

## 2014-08-04 ENCOUNTER — Encounter: Payer: Self-pay | Admitting: Dietician

## 2014-10-13 ENCOUNTER — Ambulatory Visit: Payer: Medicare Other | Admitting: Gynecology

## 2014-10-16 ENCOUNTER — Encounter (HOSPITAL_COMMUNITY): Payer: Self-pay | Admitting: Cardiology

## 2014-10-23 ENCOUNTER — Encounter: Payer: Self-pay | Admitting: Certified Nurse Midwife

## 2014-10-23 ENCOUNTER — Ambulatory Visit (INDEPENDENT_AMBULATORY_CARE_PROVIDER_SITE_OTHER): Payer: Medicare Other | Admitting: Certified Nurse Midwife

## 2014-10-23 VITALS — BP 118/64 | HR 68 | Resp 16 | Ht 61.75 in | Wt 216.0 lb

## 2014-10-23 DIAGNOSIS — Z01419 Encounter for gynecological examination (general) (routine) without abnormal findings: Secondary | ICD-10-CM

## 2014-10-23 DIAGNOSIS — Z23 Encounter for immunization: Secondary | ICD-10-CM

## 2014-10-23 NOTE — Progress Notes (Signed)
69 y.o. [id number] Divorced  Caucasian Fe here for annual exam. Menopausal no HRT. Denies vaginal bleeding or vaginal dryness issues. Not sexually active. Sees PCP twice yearly for medication management for hypertension and cholesterol and aex and labs. Declines pap smear today. No gyn concerns today or other problems.  Patient's last menstrual period was 10/03/1998.          Sexually active: No.  The current method of family planning is post menopausal status.    Exercising: Yes.    water walking & water yoga Smoker:  no  Health Maintenance: Pap: 05-12-09 neg MMG:  08-14-13 density a, birads 1:neg Colonoscopy:  2000 BMD:   2014 TDaP:  Unsure desires update Labs: none Self breast exam: done occ   reports that she has never smoked. She has never used smokeless tobacco. She reports that she drinks alcohol. She reports that she does not use illicit drugs.  Past Medical History  Diagnosis Date  . Hyperlipidemia   . Atrial fibrillation   . Anxiety   . Hypertension   . Intermittent atrial fibrillation   . Wears glasses   . OSA (obstructive sleep apnea) 06/06/2013    using a cpap  . Sleep apnea   . Arthritis   . PONV (postoperative nausea and vomiting)     severe-also can get agitated waking up    Past Surgical History  Procedure Laterality Date  . Carpal tunnel release Left 2007  . Foot surgery Left 2005    bunionectomy-osteotomy  . Carpal tunnel release Right 1992  . Colonoscopy    . Carpometacarpal (cmc) fusion of thumb Right 11/26/2013    Procedure: CARPOMETACARPAL (CMC) FUSION OF THUMB;  Surgeon: Wyn Forsterobert V Sypher Jr., MD;  Location:  SURGERY CENTER;  Service: Orthopedics;  Laterality: Right;    Current Outpatient Prescriptions  Medication Sig Dispense Refill  . aliskiren (TEKTURNA) 300 MG tablet Take 300 mg by mouth daily.    Marland Kitchen. amLODipine (NORVASC) 10 MG tablet Take 10 mg by mouth daily.    Marland Kitchen. apixaban (ELIQUIS) 5 MG TABS tablet Take 5 mg by mouth 2 (two) times daily.     . cholecalciferol (VITAMIN D) 1000 UNITS tablet Take 1,000 Units by mouth daily.    . Multiple Vitamins-Minerals (CENTRUM SILVER PO) Take 1 tablet by mouth daily.    . nebivolol (BYSTOLIC) 10 MG tablet Take 10 mg by mouth daily.    . sertraline (ZOLOFT) 100 MG tablet Take 1 tablet (100 mg total) by mouth daily. 90 tablet 3  . simvastatin (ZOCOR) 40 MG tablet Take 40 mg by mouth every evening.    Marland Kitchen. spironolactone (ALDACTONE) 25 MG tablet Take 25 mg by mouth daily.     No current facility-administered medications for this visit.    Family History  Problem Relation Age of Onset  . Leukemia Father   . Colon cancer Father   . Diabetes Father   . Prostate cancer Father   . Hypertension Father   . Breast cancer Mother [id number]  . Hypertension Mother   . Hyperlipidemia Other   . COPD Other   . Cancer Other   . Obesity Other     ROS:  Pertinent items are noted in HPI.  Otherwise, a comprehensive ROS was negative.  Exam:   BP 118/64 mmHg  Pulse 68  Resp 16  Ht 5' 1.75" (1.568 m)  Wt 216 lb (97.977 kg)  BMI 39.85 kg/m2  LMP 10/03/1998 Height: 5' 1.75" (156.8 cm) Ht Readings from  Last 3 Encounters:  10/23/14 5' 1.75" (1.568 m)  06/23/14 5' 3.5" (1.613 m)  11/20/13  (1.575 m)    General appearance: alert, cooperative and appears stated age Head: Normocephalic, without obvious abnormality, atraumatic Neck: no adenopathy, supple, symmetrical, trachea midline and thyroid normal to inspection and palpation Lungs: clear to auscultation bilaterally Breasts: normal appearance, no masses or tenderness, No nipple retraction or dimpling, No nipple discharge or bleeding, No axillary or supraclavicular adenopathy Heart: regular rate and rhythm Abdomen: soft, non-tender; no masses,  no organomegaly Extremities: extremities normal, atraumatic, no cyanosis or edema Skin: Skin color, texture, turgor normal. No rashes or lesions Lymph nodes: Cervical, supraclavicular, and axillary nodes  normal. No abnormal inguinal nodes palpated Neurologic: Grossly normal   Pelvic: External genitalia:  no lesions              Urethra:  normal appearing urethra with no masses, tenderness or lesions              Bartholin's and Skene's: normal                 Vagina: normal appearing vagina with normal color and discharge, no lesions              Cervix: normal, non tender,no lesions              Pap taken: No. Bimanual Exam:  Uterus:  normal size, contour, position, consistency, mobility, non-tender              Adnexa: normal adnexa and no mass, fullness, tenderness               Rectovaginal: Confirms               Anus:  normal sphincter tone, no lesions    A:  Well Woman with normal exam  Menopausal no HRT  Hypertension, cholesterol stable medication with PCP management  P:   Reviewed health and wellness pertinent to exam  Aware of need to evaluate if vaginal bleeding  Continue follow up as indicated.  Pap smear not taken today   counseled on breast self exam, mammography screening, adequate intake of calcium and vitamin D, diet and exercise  return annually or prn  An After Visit Summary was printed and given to the patient.

## 2014-10-23 NOTE — Patient Instructions (Signed)

## 2014-10-26 NOTE — Progress Notes (Signed)
Reviewed personally.  M. Suzanne Claramae Rigdon, MD.  

## 2014-12-08 ENCOUNTER — Other Ambulatory Visit: Payer: Self-pay | Admitting: *Deleted

## 2014-12-08 NOTE — Telephone Encounter (Signed)
[facility]  Last AEX:  10/23/14 with DL  Next AEX:  4/54/091/24/17 with DL Last MMG (if hormonal medication request): N/A Refill authorized: Please advise.

## 2014-12-08 NOTE — Telephone Encounter (Signed)
[facility]ed [facility] stating that refills have been sent.

## 2014-12-08 NOTE — Telephone Encounter (Signed)
She has not be given this by our practice in the past 2 years, reviewed Dr Farrel GobbleLathrop note also.

## 2014-12-11 MED ORDER — SERTRALINE HCL 100 MG PO TABS: 100.0000 mg | ORAL_TABLET | Freq: Every day | ORAL | Status: DC

## 2015-06-11 DIAGNOSIS — Z9989 Dependence on other enabling machines and devices: Secondary | ICD-10-CM

## 2015-06-11 DIAGNOSIS — Z6841 Body Mass Index (BMI) 40.0 and over, adult: Secondary | ICD-10-CM

## 2015-06-11 DIAGNOSIS — E782 Mixed hyperlipidemia: Secondary | ICD-10-CM | POA: Insufficient documentation

## 2015-06-11 DIAGNOSIS — G4733 Obstructive sleep apnea (adult) (pediatric): Secondary | ICD-10-CM | POA: Insufficient documentation

## 2015-08-19 IMAGING — CR DG HIP (WITH OR WITHOUT PELVIS) 2-3V*L*
2 series · 2 of 2 positions shown · non-contrast
Comparison: None.

CLINICAL DATA: Left hip pain.

EXAM:
LEFT HIP - COMPLETE 2+ VIEW

[view not recorded (1 of 2)]
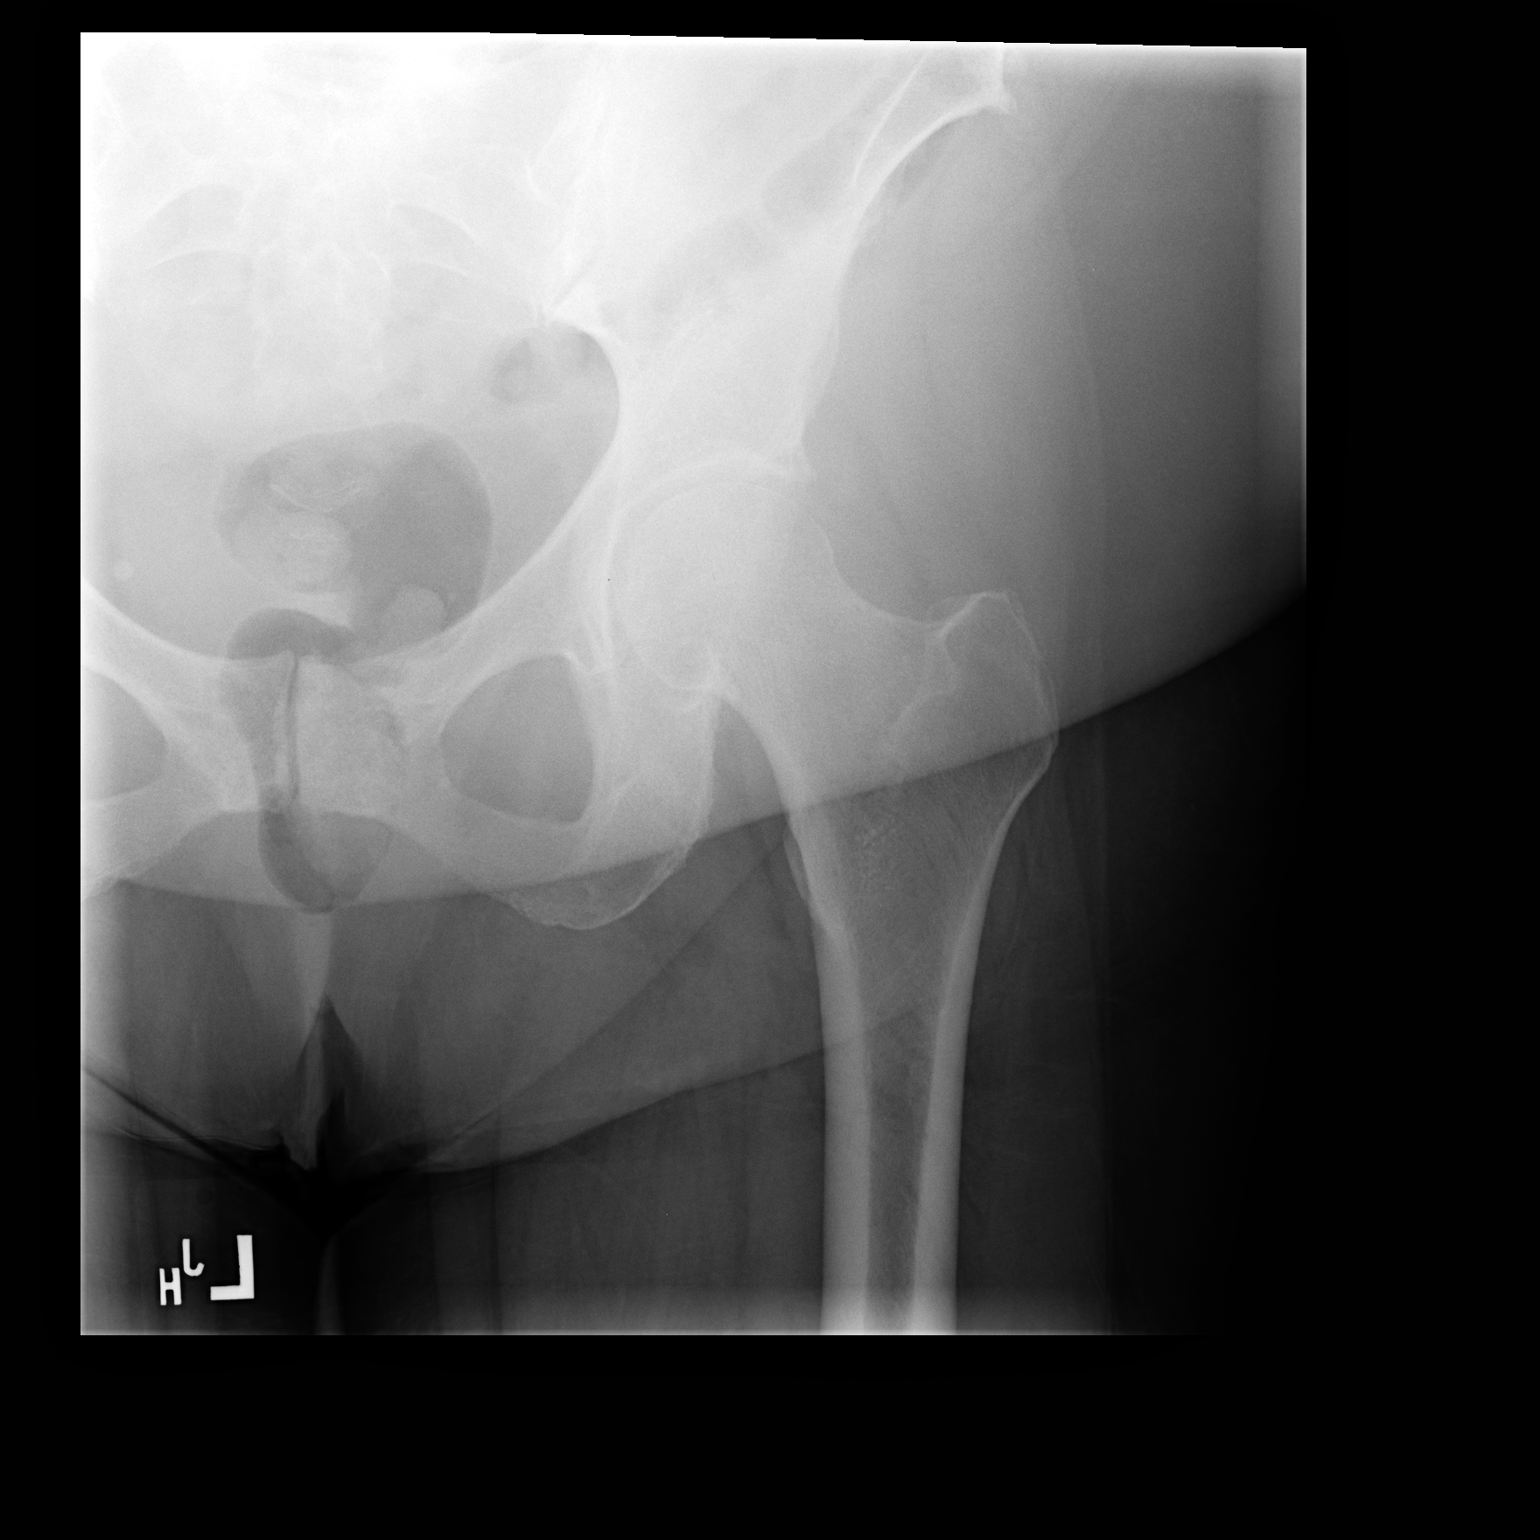

[view not recorded (2 of 2)]
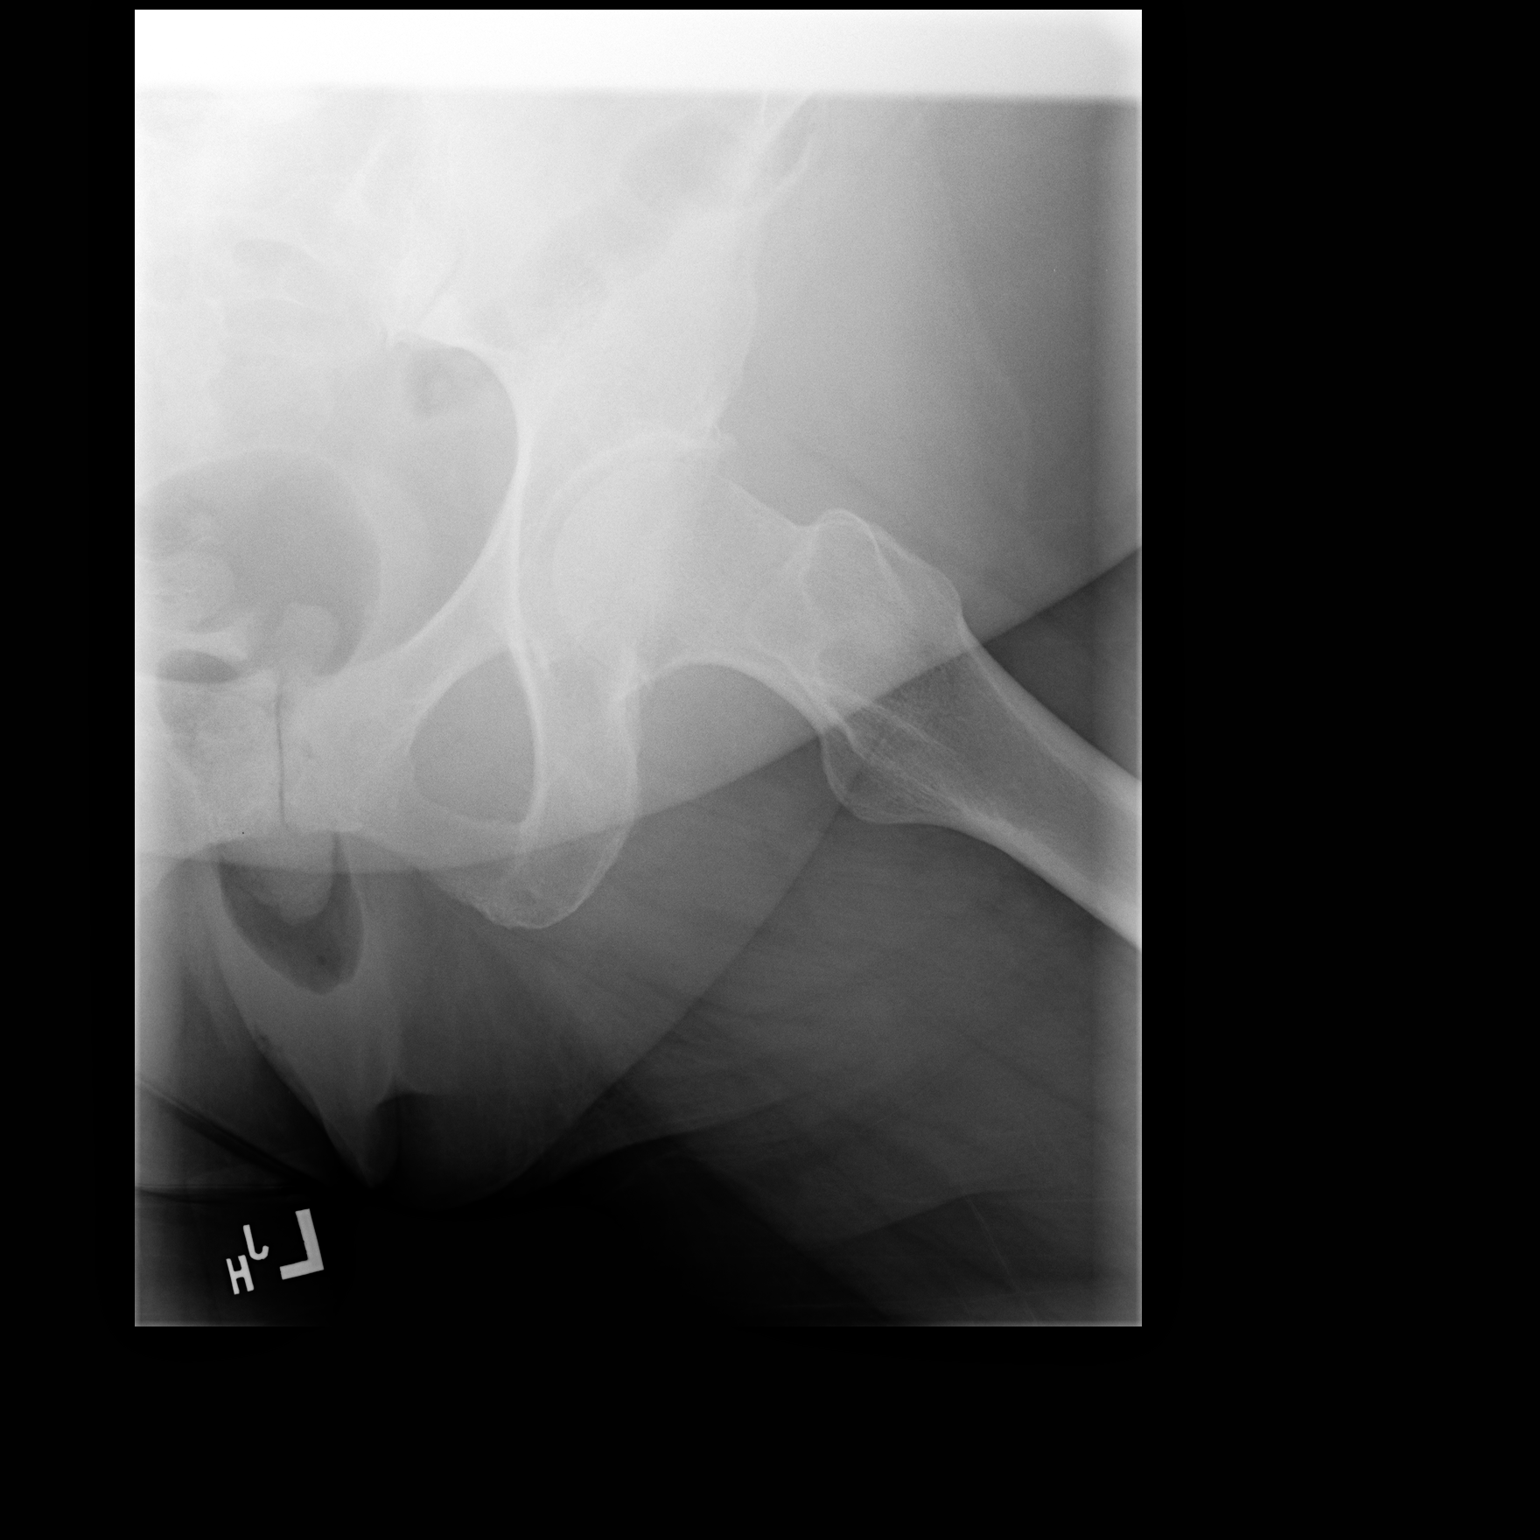

[2 of 2 positions shown; findings below may reference images not displayed]

FINDINGS: There is no evidence of hip fracture or dislocation. There is no
evidence of arthropathy or other focal bone abnormality.
IMPRESSION: Negative.

## 2015-09-09 ENCOUNTER — Other Ambulatory Visit: Payer: Self-pay | Admitting: Certified Nurse Midwife

## 2015-09-09 NOTE — Telephone Encounter (Signed)
Medication refill request: Zoloft Last AEX:  10-08-13  Next AEX: 10-27-15 Last MMG (if hormonal medication request): 11-04-14 WNL Refill authorized: please advise

## 2015-10-27 ENCOUNTER — Ambulatory Visit (INDEPENDENT_AMBULATORY_CARE_PROVIDER_SITE_OTHER): Payer: Medicare Other | Admitting: Certified Nurse Midwife

## 2015-10-27 ENCOUNTER — Encounter: Payer: Self-pay | Admitting: Certified Nurse Midwife

## 2015-10-27 VITALS — BP 122/68 | HR 70 | Resp 16 | Ht 61.75 in | Wt 189.0 lb

## 2015-10-27 DIAGNOSIS — Z01419 Encounter for gynecological examination (general) (routine) without abnormal findings: Secondary | ICD-10-CM | POA: Diagnosis not present

## 2015-10-27 DIAGNOSIS — Z124 Encounter for screening for malignant neoplasm of cervix: Secondary | ICD-10-CM | POA: Diagnosis not present

## 2015-10-27 NOTE — Progress Notes (Signed)
70 y.o. G45P1001 Divorced  Caucasian Fe here for annual exam.  Menopausal no HRT. Denies vaginal bleeding or vaginal dryness. Patient working with [facility] here in [place]. Has lost 40 pounds. Patient sees PCP for hypertension/ cholesterol and management/labs and aex. Patient aware colonoscopy due and will work with PCP to schedule.  No health issues today.  Patient's last menstrual period was 10/03/1998.          Sexually active: No.  The current method of family planning is post menopausal status.    Exercising: Yes.    water aerobics, water walking, water yoga, chair yoga Smoker:  no  Health Maintenance: Pap:  05-12-09 neg MMG:  11-05-14 category a density,birads 1:neg Colonoscopy:  2005 10 years normal, BMD:   2014 TDaP:  2016 Shingles: yes Pneumonia: yes Hep C and HIV: not done Labs: pcp Self breast exam: done occ   reports that she has never smoked. She has never used smokeless tobacco. She reports that she does not drink alcohol or use illicit drugs.  Past Medical History  Diagnosis Date  . Hyperlipidemia   . Atrial fibrillation (HCC)   . Anxiety   . Hypertension   . Intermittent atrial fibrillation (HCC)   . Wears glasses   . OSA (obstructive sleep apnea) 06/06/2013    using a cpap  . Sleep apnea   . Arthritis   . PONV (postoperative nausea and vomiting)     severe-also can get agitated waking up    Past Surgical History  Procedure Laterality Date  . Carpal tunnel release Left 2007  . Foot surgery Left 2005    bunionectomy-osteotomy  . Carpal tunnel release Right 1992  . Colonoscopy    . Carpometacarpal (cmc) fusion of thumb Right 11/26/2013    Procedure: CARPOMETACARPAL (CMC) FUSION OF THUMB;  Surgeon: Wyn Forster., MD;  Location: [facility];  Service: Orthopedics;  Laterality: Right;    Current Outpatient Prescriptions  Medication Sig Dispense Refill  . aliskiren (TEKTURNA) 300 MG tablet Take 300 mg by mouth daily.     Marland Kitchen amLODipine (NORVASC) 10 MG tablet Take 10 mg by mouth daily.    Marland Kitchen apixaban (ELIQUIS) 5 MG TABS tablet Take 5 mg by mouth 2 (two) times daily.    . Ascorbic Acid (VITAMIN C PO) Take by mouth daily.    Marland Kitchen buPROPion (WELLBUTRIN SR) 150 MG 12 hr tablet Take 150 mg by mouth 2 (two) times daily.     . cholecalciferol (VITAMIN D) 1000 UNITS tablet Take 1,000 Units by mouth daily.    . Multiple Vitamins-Minerals (CENTRUM SILVER PO) Take 1 tablet by mouth daily.    . naltrexone (DEPADE) 50 MG tablet Two tablets QHS    . nebivolol (BYSTOLIC) 10 MG tablet Take 10 mg by mouth daily.    . sertraline (ZOLOFT) 100 MG tablet TAKE 1 TABLET(100 MG) BY MOUTH DAILY 90 tablet 0  . simvastatin (ZOCOR) 40 MG tablet Take 40 mg by mouth every evening.    Marland Kitchen spironolactone (ALDACTONE) 25 MG tablet Take 25 mg by mouth daily.    Marland Kitchen UNABLE TO FIND daily. Move free ultra     No current facility-administered medications for this visit.    Family History  Problem Relation Age of Onset  . Leukemia Father   . Colon cancer Father   . Diabetes Father   . Prostate cancer Father   . Hypertension Father   . Breast cancer Mother 18  .  Hypertension Mother   . Hyperlipidemia Other   . COPD Other   . Cancer Other   . Obesity Other     ROS:  Pertinent items are noted in HPI.  Otherwise, a comprehensive ROS was negative.  Exam:   BP 122/68 mmHg  Pulse 70  Resp 16  Ht 5' 1.75" (1.568 m)  Wt 189 lb (85.73 kg)  BMI 34.87 kg/m2  LMP 10/03/1998 Height: 5' 1.75" (156.8 cm) Ht Readings from Last 3 Encounters:  10/27/15 5' 1.75" (1.568 m)  10/23/14 5' 1.75" (1.568 m)  06/23/14 5' 3.5" (1.613 m)    General appearance: alert, cooperative and appears stated age Head: Normocephalic, without obvious abnormality, atraumatic Neck: no adenopathy, supple, symmetrical, trachea midline and thyroid normal to inspection and palpation Lungs: clear to auscultation bilaterally Breasts: normal appearance, no masses or tenderness,  No nipple retraction or dimpling, No nipple discharge or bleeding, No axillary or supraclavicular adenopathy Heart: regular rate and rhythm Abdomen: soft, non-tender; no masses,  no organomegaly Extremities: extremities normal, atraumatic, no cyanosis or edema Skin: Skin color, texture, turgor normal. No rashes or lesions Lymph nodes: Cervical, supraclavicular, and axillary nodes normal. No abnormal inguinal nodes palpated Neurologic: Grossly normal   Pelvic: External genitalia:  no lesions              Urethra:  normal appearing urethra with no masses, tenderness or lesions              Bartholin's and Skene's: normal                 Vagina: normal appearing vagina with normal color and discharge, no lesions              Cervix: normal,non tender, no lesions              Pap taken: Yes.   Bimanual Exam:  Uterus:  normal size, contour, position, consistency, mobility, non-tender              Adnexa: normal adnexa and no mass, fullness, tenderness               Rectovaginal: Confirms               Anus:  normal sphincter tone, no lesions  Chaperone present: yes  A:  Well Woman with normal exam  Menopausal no HRT  Rectocele grade 2, not symptomatic  Hypertension/Cholesterol management with PCP  40 pound intentional weight loss with [facility]  P:   Reviewed health and wellness pertinent to exam  Aware of need to evaluate if vaginal bleeding  Aware of being present, will advise if symptomatic  Continue follow up with PCP and weight loss as indicated.  Pap smear as above   counseled on breast self exam, mammography screening, adequate intake of calcium and vitamin D, diet and exercise, Kegel's exercises return annually or prn  An After Visit Summary was printed and given to the patient.

## 2015-10-27 NOTE — Patient Instructions (Signed)

## 2015-10-29 LAB — IPS PAP SMEAR ONLY

## 2015-10-30 NOTE — Progress Notes (Signed)
Reviewed personally.  M. Suzanne Nemiah Kissner, MD.  

## 2015-12-10 ENCOUNTER — Other Ambulatory Visit: Payer: Self-pay | Admitting: Certified Nurse Midwife

## 2015-12-10 NOTE — Telephone Encounter (Signed)
Medication refill request: Zoloft Last AEX:  10-27-15 Next AEX: 10-28-16 Last MMG (if hormonal medication request): 11-05-14 WNL Refill authorized: please advise

## 2016-08-18 DIAGNOSIS — M1812 Unilateral primary osteoarthritis of first carpometacarpal joint, left hand: Secondary | ICD-10-CM | POA: Insufficient documentation

## 2016-10-25 DIAGNOSIS — M25532 Pain in left wrist: Secondary | ICD-10-CM

## 2016-10-25 DIAGNOSIS — M25432 Effusion, left wrist: Secondary | ICD-10-CM | POA: Insufficient documentation

## 2016-10-28 ENCOUNTER — Ambulatory Visit: Payer: Medicare Other | Admitting: Certified Nurse Midwife

## 2016-12-14 ENCOUNTER — Encounter: Payer: Self-pay | Admitting: Certified Nurse Midwife

## 2016-12-14 ENCOUNTER — Ambulatory Visit (INDEPENDENT_AMBULATORY_CARE_PROVIDER_SITE_OTHER): Payer: Medicare Other | Admitting: Certified Nurse Midwife

## 2016-12-14 VITALS — BP 128/62 | HR 80 | Resp 16 | Ht 61.25 in | Wt 185.0 lb

## 2016-12-14 DIAGNOSIS — N951 Menopausal and female climacteric states: Secondary | ICD-10-CM

## 2016-12-14 DIAGNOSIS — N816 Rectocele: Secondary | ICD-10-CM

## 2016-12-14 DIAGNOSIS — Z01419 Encounter for gynecological examination (general) (routine) without abnormal findings: Secondary | ICD-10-CM | POA: Diagnosis not present

## 2016-12-14 NOTE — Patient Instructions (Signed)
EXERCISE AND DIET:  We recommended that you start or continue a regular exercise program for good health. Regular exercise means any activity that makes your heart beat faster and makes you sweat.  We recommend exercising at least 30 minutes per day at least 3 days a week, preferably 4 or 5.  We also recommend a diet low in fat and sugar.  Inactivity, poor dietary choices and obesity can cause diabetes, heart attack, stroke, and kidney damage, among others.   ? ?ALCOHOL AND SMOKING:  Women should limit their alcohol intake to no more than 7 drinks/beers/glasses of wine (combined, not each!) per week. Moderation of alcohol intake to this level decreases your risk of breast cancer and liver damage. And of course, no recreational drugs are part of a healthy lifestyle.  And absolutely no smoking or even second hand smoke. Most people know smoking can cause heart and lung diseases, but did you know it also contributes to weakening of your bones? Aging of your skin?  Yellowing of your teeth and nails? ? ?CALCIUM AND VITAMIN D:  Adequate intake of calcium and Vitamin D are recommended.  The recommendations for exact amounts of these supplements seem to change often, but generally speaking 600 mg of calcium (either carbonate or citrate) and 800 units of Vitamin D per day seems prudent. Certain women may benefit from higher intake of Vitamin D.  If you are among these women, your doctor will have told you during your visit.   ? ?PAP SMEARS:  Pap smears, to check for cervical cancer or precancers,  have traditionally been done yearly, although recent scientific advances have shown that most women can have pap smears less often.  However, every woman still should have a physical exam from her gynecologist every year. It will include a breast check, inspection of the vulva and vagina to check for abnormal growths or skin changes, a visual exam of the cervix, and then an exam to evaluate the size and shape of the uterus and  ovaries.  And after 70 years of age, a rectal exam is indicated to check for rectal cancers. We will also provide age appropriate advice regarding health maintenance, like when you should have certain vaccines, screening for sexually transmitted diseases, bone density testing, colonoscopy, mammograms, etc.  ? ?MAMMOGRAMS:  All women over 40 years old should have a yearly mammogram. Many facilities now offer a "3D" mammogram, which may cost around $50 extra out of pocket. If possible,  we recommend you accept the option to have the 3D mammogram performed.  It both reduces the number of women who will be called back for extra views which then turn out to be normal, and it is better than the routine mammogram at detecting truly abnormal areas.   ? ?COLONOSCOPY:  Colonoscopy to screen for colon cancer is recommended for all women at age 50.  We know, you hate the idea of the prep.  We agree, BUT, having colon cancer and not knowing it is worse!!  Colon cancer so often starts as a polyp that can be seen and removed at colonscopy, which can quite literally save your life!  And if your first colonoscopy is normal and you have no family history of colon cancer, most women don't have to have it again for 10 years.  Once every ten years, you can do something that may end up saving your life, right?  We will be happy to help you get it scheduled when you are ready.    Be sure to check your insurance coverage so you understand how much it will cost.  It may be covered as a preventative service at no cost, but you should check your particular policy.   ? ?About Rectocele ? ?Overview ? ?A rectocele is a type of hernia which causes different degrees of bulging of the rectal tissues into the vaginal wall.  You may even notice that it presses against the vaginal wall so much that some vaginal tissues droop outside of the opening of your vagina. ? ?Causes of Rectocele ? ?The most common cause is childbirth.  The muscles and ligaments  in the pelvis that hold up and support the female organs and vagina become stretched and weakened during labor and delivery.  The more babies you have, the more the support tissues are stretched and weakened.  Not everyone who has a baby will develop a rectocele.  Some women have stronger supporting tissue in the pelvis and may not have as much of a problem as others.  Women who have a Cesarean section usually do not get rectocele's unless they pushed a long time prior to the cesarean delivery. ? ?Other conditions that can cause a rectocele include chronic constipation, a chronic cough, a lot of heavy lifting, and obesity.  Older women may have this problem because the loss of female hormones causes the vaginal tissue to become weaker. ? ?Symptoms ? ?There may not be any symptoms.  If you do have symptoms, they may include: ?Pelvic pressure in the rectal area ?Protrusion of the lower part of the vagina through the opening of the vagina ?Constipation and trapping of the stool, making it difficult to have a bowel movement.  In severe cases, you may have to press on the lower part of your vagina to help push the stool out of you rectum.  This is called splinting to empty. ? ?Diagnosing Rectocele ? ?Your health care provider will ask about your symptoms and perform a pelvic exam.  S/he will ask you to bear down, pushing like you are having a bowel movement so as to see how far the lower part of the vagina protrudes into the vagina and possible outside of the vagina.  Your provider will also ask you to contract the muscles of your pelvis (like you are stopping the stream in the middle of urinating) to determine the strength of your pelvic muscles.  Your provider may also do a rectal exam. ? ?Treatment Options ? ?If you do not have any symptoms, no treatment may be necessary.  Other treatment options include: ?Pelvic floor exercises: Contracting the muscles in your genital area may help strengthen your muscles and support  the organs.  Be sure to get proper exercise instruction from you physical therapist. ?A pessary (removealbe pelvic support device) sometimes helps rectocele symptoms. ?Surgery: Surgical repair may be necessary. In some cases the uterus may need to be taken out ( a hysterectomy) as well.  There are many types of surgery for pelvic support problems.  Look for physicians who specialize in repair procedures. ? ?You can take care of yourself by: ?Treating and preventing constipation ?Avoiding heavy lifting, and lifting correctly (with your legs, not with you waist or back) ?Treating a chronic cough or bronchitis ?Not smoking ?avoiding too much weight gain ?Doing pelvic floor exercises ? ?? 2007, [employer] Doc.33 ?

## 2016-12-14 NOTE — Progress Notes (Signed)
71 y.o. G4P1001 Divorced  Caucasian Fe here for annual exam. Menopausal no HRT.  Denies vaginal bleeding or vaginal dryness.  Patient did [facility] diet and lost down to 169. Has gained some back with diet change. Had problems with Optifast and cost wise, was somewhat prohibitive. Sees PCP Dr.Aronson for aex/labs. Had surgery on left arm on 2/17, still in therapy at present. Has resumed her water aerobics and yoga. Denies any other health issues today..  Patient's last menstrual period was 10/03/1998.          Sexually active: No.  The current method of family planning is post menopausal status.    Exercising: Yes.    water walking, yoga, aerobics Smoker:  no  Health Maintenance: Pap:  10/27/15 negative MMG:  12/29/15 BIRADS 1 negative  Colonoscopy:  04/23/09 polyp- repeat 5 years BMD:   2014  TDaP:  10/23/14  Shingles: yes Pneumonia: yes Hep C and HIV: discuss today Labs: PCP takes care of all labs Self breast exam: occasionally   reports that she has never smoked. She has never used smokeless tobacco. She reports that she does not drink alcohol or use drugs.  Past Medical History:  Diagnosis Date  . Anxiety   . Arthritis   . Atrial fibrillation (HCC)   . Hyperlipidemia   . Hypertension   . Intermittent atrial fibrillation (HCC)   . OSA (obstructive sleep apnea) 06/06/2013   using a cpap  . PONV (postoperative nausea and vomiting)    severe-also can get agitated waking up  . Sleep apnea   . Wears glasses     Past Surgical History:  Procedure Laterality Date  . CARPAL TUNNEL RELEASE Left 2007  . CARPAL TUNNEL RELEASE Right 1992  . CARPOMETACARPAL (CMC) FUSION OF THUMB Right 11/26/2013   Procedure: CARPOMETACARPAL [facility]) FUSION OF THUMB;  Surgeon: Wyn Forster., MD;  Location: [facility];  Service: Orthopedics;  Laterality: Right;  . COLONOSCOPY    . FOOT SURGERY Left 2005   bunionectomy-osteotomy    Current Outpatient Prescriptions  Medication Sig  Dispense Refill  . aliskiren (TEKTURNA) 300 MG tablet Take 300 mg by mouth daily.    Marland Kitchen amLODipine (NORVASC) 10 MG tablet Take 10 mg by mouth daily.    Marland Kitchen apixaban (ELIQUIS) 5 MG TABS tablet Take 5 mg by mouth 2 (two) times daily.    . Ascorbic Acid (VITAMIN C PO) Take by mouth daily.    . Cholecalciferol (VITAMIN D PO) Take 3,000 Units by mouth daily.     Marland Kitchen lisdexamfetamine (VYVANSE) 30 MG capsule Take 30 mg by mouth daily.    . Multiple Vitamins-Minerals (CENTRUM SILVER PO) Take 1 tablet by mouth daily.    . nebivolol (BYSTOLIC) 10 MG tablet Take 10 mg by mouth daily.    . Probiotic Product (PHILLIPS COLON HEALTH PO) Take by mouth as needed.    . simvastatin (ZOCOR) 40 MG tablet Take 40 mg by mouth every evening.    Marland Kitchen spironolactone (ALDACTONE) 25 MG tablet Take 25 mg by mouth daily.    Marland Kitchen UNABLE TO FIND daily. Move free ultra     No current facility-administered medications for this visit.     Family History  Problem Relation Age of Onset  . Leukemia Father   . Colon cancer Father   . Diabetes Father   . Prostate cancer Father   . Hypertension Father   . Breast cancer Mother 12  . Hypertension Mother   .  Hyperlipidemia Other   . COPD Other   . Cancer Other   . Obesity Other     ROS:  Pertinent items are noted in HPI.  Otherwise, a comprehensive ROS was negative.  Exam:   BP 128/62 (BP Location: Right Arm, Patient Position: Sitting, Cuff Size: Normal)   Pulse 80   Resp 16   Ht 5' 1.25" (1.556 m)   Wt 185 lb (83.9 kg)   LMP 10/03/1998   BMI 34.67 kg/m  Height: 5' 1.25" (155.6 cm) Ht Readings from Last 3 Encounters:  12/14/16 5' 1.25" (1.556 m)  10/27/15 5' 1.75" (1.568 m)  10/23/14 5' 1.75" (1.568 m)    General appearance: alert, cooperative and appears stated age Head: Normocephalic, without obvious abnormality, atraumatic Neck: no adenopathy, supple, symmetrical, trachea midline and thyroid normal to inspection and palpation Lungs: clear to auscultation  bilaterally Breasts: normal appearance, no masses or tenderness, No nipple retraction or dimpling, No nipple discharge or bleeding, No axillary or supraclavicular adenopathy Heart: regular rate and rhythm Abdomen: soft, non-tender; no masses,  no organomegaly Extremities: extremities normal, atraumatic, no cyanosis or edema Skin: Skin color, texture, turgor normal. No rashes or lesions Lymph nodes: Cervical, supraclavicular, and axillary nodes normal. No abnormal inguinal nodes palpated Neurologic: Grossly normal   Pelvic: External genitalia:  no lesions              Urethra:  normal appearing urethra with no masses, tenderness or lesions              Bartholin's and Skene's: normal                 Vagina: normal appearing vagina with normal color and discharge, no lesions, grade 3 rectocele noted, shown to patient in mirror.              Cervix: no cervical motion tenderness, no lesions and normal appearance              Pap taken: no cervical motion tenderness, no lesions and normal appearance Bimanual Exam:  Uterus:  normal size, contour, position, consistency, mobility, non-tender              Adnexa: normal adnexa and no mass, fullness, tenderness               Rectovaginal: Confirms               Anus:  normal sphincter tone, no lesions  Chaperone present: yes  A:  Well Woman with normal exam  Menopausal no HRT  Rectocele grade 3 not symptomatic  Hypertension/atrial fib/anxiety with PCP management  Colonoscopy due,patient aware and plans to schedule   P:   Reviewed health and wellness pertinent to exam  Aware of need to evaluate if vaginal bleeding  Discussed finding and etiology and possible expectations with stool habits or possible incontinence. She is having no issues at all and works on kegel exercise daily in addition to exercise. Has regular bowel habits also. Discussed maintaining regular bowel habits helps with decrease protrusion and constipation issues. Warning signs  of incontinence or constipation uncontrolled. Questions addressed.  Pap smear as above not taken.  Continue follow up with MD as needed.   counseled on breast self exam, mammography screening, adequate intake of calcium and vitamin D, diet and exercise, Kegel's exercises  return annually or prn  An After Visit Summary was printed and given to the patient.

## 2016-12-19 NOTE — Progress Notes (Signed)
Encounter reviewed Micole Delehanty, MD   

## 2017-02-02 ENCOUNTER — Encounter: Payer: Self-pay | Admitting: Certified Nurse Midwife

## 2017-12-15 ENCOUNTER — Other Ambulatory Visit: Payer: Self-pay

## 2017-12-15 ENCOUNTER — Encounter: Payer: Self-pay | Admitting: Certified Nurse Midwife

## 2017-12-15 ENCOUNTER — Ambulatory Visit (INDEPENDENT_AMBULATORY_CARE_PROVIDER_SITE_OTHER): Payer: Medicare Other | Admitting: Certified Nurse Midwife

## 2017-12-15 VITALS — BP 120/80 | HR 70 | Resp 16 | Ht 61.25 in | Wt 186.0 lb

## 2017-12-15 DIAGNOSIS — Z01419 Encounter for gynecological examination (general) (routine) without abnormal findings: Secondary | ICD-10-CM | POA: Diagnosis not present

## 2017-12-15 DIAGNOSIS — Z780 Asymptomatic menopausal state: Secondary | ICD-10-CM | POA: Diagnosis not present

## 2017-12-15 DIAGNOSIS — N8110 Cystocele, unspecified: Secondary | ICD-10-CM

## 2017-12-15 NOTE — Progress Notes (Signed)
72 y.o. [id number] Divorced  Caucasian Fe here for annual exam. Post menopausal, no vaginal bleeding or vaginal dryness. Sees PCP for aex /labs/hypertension/cholesterol management. Continues with weight loss and exercise, down another 10 pounds. [place]ing trip to [place] and looking forward to trip. Mom passed in the last year age 71.Feels well, no issues. Denies urinary leakage or constipation. Feels vaginal prolapse has not changed,no concerns at this time. No other health issues today.    Patient's last menstrual period was 10/03/1998.          Sexually active: No.  The current method of family planning is post menopausal status.    Exercising: Yes.    aerobics, yoga, elliptical, stationary bike & h20 class Smoker:  no  Health Maintenance: Pap:  05-12-09 neg, 10-27-15 neg History of Abnormal Pap: no MMG:  01-18-17 category a density birads 1:neg Self Breast exams: yes Colonoscopy:  2005 f/u 8870yrs neg, Cologard with PCP last year BMD:   2014 normal TDaP:  2016 Shingles: had done Pneumonia: had done Hep C and HIV: not done Labs: if needed   reports that  has never smoked. she has never used smokeless tobacco. She reports that she does not drink alcohol or use drugs.  Past Medical History:  Diagnosis Date  . Anxiety   . Arthritis   . Atrial fibrillation (HCC)   . Hyperlipidemia   . Hypertension   . Intermittent atrial fibrillation (HCC)   . OSA (obstructive sleep apnea) 06/06/2013   using a cpap  . PONV (postoperative nausea and vomiting)    severe-also can get agitated waking up  . Sleep apnea   . Wears glasses     Past Surgical History:  Procedure Laterality Date  . CARPAL TUNNEL RELEASE Left 2007  . CARPAL TUNNEL RELEASE Right 1992  . CARPOMETACARPAL ([facility]) FUSION OF THUMB Right 11/26/2013   Procedure: CARPOMETACARPAL [facility]) FUSION OF THUMB;  Surgeon: Wyn Forsterobert V Sypher Jr., MD;  Location: [facility];  Service: Orthopedics;  Laterality: Right;  . COLONOSCOPY    .  FOOT SURGERY Left 2005   bunionectomy-osteotomy    Current Outpatient Medications  Medication Sig Dispense Refill  . aliskiren (TEKTURNA) 300 MG tablet Take 300 mg by mouth daily.    Marland Kitchen. amLODipine (NORVASC) 10 MG tablet Take 10 mg by mouth daily.    Marland Kitchen. apixaban (ELIQUIS) 5 MG TABS tablet Take 5 mg by mouth 2 (two) times daily.    . Ascorbic Acid (VITAMIN C PO) Take by mouth daily.    . Cholecalciferol (VITAMIN D PO) Take 3,000 Units by mouth daily.     Tery Sanfilippo. Docusate Sodium (COLACE PO) Take by mouth.    . Multiple Vitamins-Minerals (CENTRUM SILVER PO) Take 1 tablet by mouth daily.    . nebivolol (BYSTOLIC) 10 MG tablet Take 10 mg by mouth daily.    . Polyethylene Glycol 3350 (MIRALAX PO) Take by mouth.    . Probiotic Product (PHILLIPS COLON HEALTH PO) Take by mouth as needed.    . simvastatin (ZOCOR) 40 MG tablet Take 40 mg by mouth every evening.    Marland Kitchen. spironolactone (ALDACTONE) 25 MG tablet Take 25 mg by mouth daily.    Marland Kitchen. UNABLE TO FIND daily. Move free ultra    . VYVANSE 60 MG capsule   0   No current facility-administered medications for this visit.     Family History  Problem Relation Age of Onset  . Leukemia Father   . Colon cancer Father   .  Diabetes Father   . Prostate cancer Father   . Hypertension Father   . Breast cancer Mother 65  . Hypertension Mother   . Hyperlipidemia Other   . COPD Other   . Cancer Other   . Obesity Other     ROS:  Pertinent items are noted in HPI.  Otherwise, a comprehensive ROS was negative.  Exam:   BP 120/80   Pulse 70   Resp 16   Ht 5' 1.25" (1.556 m)   Wt 186 lb (84.4 kg)   LMP 10/03/1998   BMI 34.86 kg/m  Height: 5' 1.25" (155.6 cm) Ht Readings from Last 3 Encounters:  12/15/17 5' 1.25" (1.556 m)  12/14/16 5' 1.25" (1.556 m)  10/27/15 5' 1.75" (1.568 m)    General appearance: alert, cooperative and appears stated age Head: Normocephalic, without obvious abnormality, atraumatic Neck: no adenopathy, supple, symmetrical, trachea  midline and thyroid normal to inspection and palpation Lungs: clear to auscultation bilaterally Breasts: normal appearance, no masses or tenderness, No nipple retraction or dimpling, No nipple discharge or bleeding, No axillary or supraclavicular adenopathy Heart: regular rate and rhythm Abdomen: soft, non-tender; no masses,  no organomegaly Extremities: extremities normal, atraumatic, no cyanosis or edema Skin: Skin color, texture, turgor normal. No rashes or lesions Lymph nodes: Cervical, supraclavicular, and axillary nodes normal. No abnormal inguinal nodes palpated Neurologic: Grossly normal   Pelvic: External genitalia:  no lesions              Urethra:  normal appearing urethra with no masses, tenderness or lesions              Bartholin's and Skene's: normal                 Vagina: normal appearing vagina with normal color and discharge, no lesions vaginal prolapse grade 2, mild cystocele              Cervix: no cervical motion tenderness, no lesions and normal appearance              Pap taken: No. Bimanual Exam:  Uterus:  normal size, contour, position, consistency, mobility, non-tender              Adnexa: normal adnexa and no mass, fullness, tenderness               Rectovaginal: Confirms               Anus:  normal sphincter tone, no lesions  Chaperone present: yes  A:  Well Woman with normal exam  Post menopausal no HRT  Vaginal wall prolapse grade 2 no change  Hypertension/cholesterol with PCP management  P:   Reviewed health and wellness pertinent to exam  Aware of need to advise if vaginal bleeding  Will advise if change or problems with bowels or bladder  Continue follow up as needed  Pap smear: no   counseled on breast self exam, mammography screening, adequate intake of calcium and vitamin D, diet and exercise, Kegel's exercises   return annually or prn  An After Visit Summary was printed and given to the patient.

## 2017-12-15 NOTE — Patient Instructions (Signed)

## 2018-05-29 DIAGNOSIS — S638X2A Sprain of other part of left wrist and hand, initial encounter: Secondary | ICD-10-CM | POA: Insufficient documentation

## 2018-12-20 ENCOUNTER — Ambulatory Visit: Payer: Medicare Other | Admitting: Certified Nurse Midwife

## 2018-12-26 ENCOUNTER — Ambulatory Visit: Payer: Medicare Other | Admitting: Certified Nurse Midwife

## 2019-01-18 ENCOUNTER — Other Ambulatory Visit: Payer: Self-pay

## 2019-01-18 MED ORDER — APIXABAN 5 MG PO TABS: 5.0000 mg | ORAL_TABLET | Freq: Two times a day (BID) | ORAL | 1 refills | Status: DC

## 2019-03-12 ENCOUNTER — Other Ambulatory Visit: Payer: Self-pay

## 2019-03-12 NOTE — Progress Notes (Signed)
73 y.o. [id number] Divorced  Caucasian Fe here for annual exam. Post menopausal no vaginal bleeding. Denies vaginal dryness or pain.  Sees PCP for lipid management, cholesterol, labs and aex.  Took trip [place] last year and had a good time. Uterine prolapse no change, no increase in urination or pressure change. No other health issues today.  Patient's last menstrual period was 10/03/1998.          Sexually active: No.  The current method of family planning is post menopausal status.    Exercising: No.  exercise Smoker:  no  Review of Systems  Constitutional: Negative.   HENT: Negative.   Eyes: Negative.   Respiratory: Negative.   Cardiovascular: Negative.   Gastrointestinal: Negative.   Genitourinary: Negative.   Musculoskeletal: Negative.   Skin: Negative.   Neurological: Negative.   Endo/Heme/Allergies: Negative.   Psychiatric/Behavioral: Negative.     Health Maintenance: Pap:  1-24- 2017 neg History of Abnormal Pap: no MMG:  01-18-17 category a density birads 1:neg, appt for friday Self Breast exams: occ Colonoscopy:  2005 f/u 946yrs neg, cologard with pcp  BMD:   2014 normal TDaP:  2016 Shingles: had done Pneumonia: had done Hep C and HIV: unsure Labs: PCP   reports that she has never smoked. She has never used smokeless tobacco. She reports that she does not drink alcohol or use drugs.  Past Medical History:  Diagnosis Date  . Anxiety   . Arthritis   . Atrial fibrillation (HCC)   . Hyperlipidemia   . Hypertension   . Intermittent atrial fibrillation (HCC)   . OSA (obstructive sleep apnea) 06/06/2013   using a cpap  . PONV (postoperative nausea and vomiting)    severe-also can get agitated waking up  . Sleep apnea   . Wears glasses     Past Surgical History:  Procedure Laterality Date  . CARPAL TUNNEL RELEASE Left 2007  . CARPAL TUNNEL RELEASE Right 1992  . CARPOMETACARPAL ([facility]) FUSION OF THUMB Right 11/26/2013   Procedure: CARPOMETACARPAL [facility]) FUSION OF  THUMB;  Surgeon: Wyn Forsterobert V Sypher Jr., MD;  Location: [facility];  Service: Orthopedics;  Laterality: Right;  . COLONOSCOPY    . FOOT SURGERY Left 2005   bunionectomy-osteotomy    Current Outpatient Medications  Medication Sig Dispense Refill  . aliskiren (TEKTURNA) 300 MG tablet Take 300 mg by mouth daily.    Marland Kitchen. amLODipine (NORVASC) 10 MG tablet Take 10 mg by mouth daily.    Marland Kitchen. apixaban (ELIQUIS) 5 MG TABS tablet Take 1 tablet (5 mg total) by mouth 2 (two) times daily. 180 tablet 1  . Ascorbic Acid (VITAMIN C PO) Take by mouth daily.    . Cholecalciferol (VITAMIN D PO) Take 3,000 Units by mouth daily.     Tery Sanfilippo. Docusate Sodium (COLACE PO) Take by mouth.    . lisdexamfetamine (VYVANSE) 70 MG capsule Take by mouth.    . Multiple Vitamins-Minerals (CENTRUM SILVER PO) Take 1 tablet by mouth daily.    . nebivolol (BYSTOLIC) 10 MG tablet Take 10 mg by mouth daily.    . Polyethylene Glycol 3350 (MIRALAX PO) Take by mouth.    . simvastatin (ZOCOR) 40 MG tablet Take 40 mg by mouth every evening.    Marland Kitchen. spironolactone (ALDACTONE) 25 MG tablet Take 25 mg by mouth daily.    Marland Kitchen. UNABLE TO FIND daily. Move free ultra     No current facility-administered medications for this visit.     Family History  Problem Relation Age of Onset  . Leukemia Father   . Colon cancer Father   . Diabetes Father   . Prostate cancer Father   . Hypertension Father   . Breast cancer Mother 39  . Hypertension Mother   . Hyperlipidemia Other   . COPD Other   . Cancer Other   . Obesity Other     ROS:  Pertinent items are noted in HPI.  Otherwise, a comprehensive ROS was negative.  Exam:   BP 120/78   Pulse 70   Temp 97.6 F (36.4 C) (Skin)   Resp 16   Ht 5' 1.5" (1.562 m)   Wt 187 lb (84.8 kg)   LMP 10/03/1998   BMI 34.76 kg/m  Height: 5' 1.5" (156.2 cm) Ht Readings from Last 3 Encounters:  03/13/19 5' 1.5" (1.562 m)  12/15/17 5' 1.25" (1.556 m)  12/14/16 5' 1.25" (1.556 m)    General  appearance: alert, cooperative and appears stated age Head: Normocephalic, without obvious abnormality, atraumatic Neck: no adenopathy, supple, symmetrical, trachea midline and thyroid normal to inspection and palpation Lungs: clear to auscultation bilaterally Breasts: normal appearance, no masses or tenderness, No nipple retraction or dimpling, No nipple discharge or bleeding, No axillary or supraclavicular adenopathy Heart: regular rate and rhythm Abdomen: soft, non-tender; no masses,  no organomegaly Extremities: extremities normal, atraumatic, no cyanosis or edema Skin: Skin color, texture, turgor normal. No rashes or lesions Lymph nodes: Cervical, supraclavicular, and axillary nodes normal. No abnormal inguinal nodes palpated Neurologic: Grossly normal   Pelvic: External genitalia:  no lesions              Urethra:  normal appearing urethra with no masses, tenderness or lesions              Bartholin's and Skene's: normal                 Vagina: normal appearing vagina with normal color and discharge, no lesions              Cervix: no lesions and anterior              Pap taken: Yes.   Bimanual Exam:  Uterus:  prolapsed second degree              Adnexa: normal adnexa and no mass, fullness, tenderness               Rectovaginal: Confirms               Anus:  normal sphincter tone, no lesions  Chaperone present: yes  A:  Well Woman with normal exam  Menopausal no HRT  Uterine prolapse grade 2 no change  PCP management of hypertension  P:   Reviewed health and wellness pertinent to exam  Aware of need to advise if vaginal bleeding  Will advise if change, discussed pessary use, no interested at this time.  Continue follow up with PCP as indicated  Pap smear: yes   counseled on breast self exam, mammography screening, feminine hygiene, adequate intake of calcium and vitamin D, diet and exercise  return annually or prn  An After Visit Summary was printed and given to the  patient.

## 2019-03-13 ENCOUNTER — Ambulatory Visit (INDEPENDENT_AMBULATORY_CARE_PROVIDER_SITE_OTHER): Payer: Medicare Other | Admitting: Certified Nurse Midwife

## 2019-03-13 ENCOUNTER — Other Ambulatory Visit (HOSPITAL_COMMUNITY)
Admission: RE | Admit: 2019-03-13 | Discharge: 2019-03-13 | Disposition: A | Discharge status: Home / Self Care | Payer: Medicare Other | Source: Ambulatory Visit | Attending: Certified Nurse Midwife | Admitting: Certified Nurse Midwife

## 2019-03-13 ENCOUNTER — Other Ambulatory Visit: Payer: Self-pay

## 2019-03-13 ENCOUNTER — Encounter: Payer: Self-pay | Admitting: Certified Nurse Midwife

## 2019-03-13 VITALS — BP 120/78 | HR 70 | Temp 97.6°F | Resp 16 | Ht 61.5 in | Wt 187.0 lb

## 2019-03-13 DIAGNOSIS — Z124 Encounter for screening for malignant neoplasm of cervix: Secondary | ICD-10-CM | POA: Insufficient documentation

## 2019-03-13 DIAGNOSIS — Z1151 Encounter for screening for human papillomavirus (HPV): Secondary | ICD-10-CM | POA: Insufficient documentation

## 2019-03-13 DIAGNOSIS — N812 Incomplete uterovaginal prolapse: Secondary | ICD-10-CM

## 2019-03-13 DIAGNOSIS — Z01419 Encounter for gynecological examination (general) (routine) without abnormal findings: Secondary | ICD-10-CM

## 2019-03-14 ENCOUNTER — Other Ambulatory Visit: Payer: Self-pay | Admitting: Cardiology

## 2019-03-14 NOTE — Telephone Encounter (Signed)
Please fill

## 2019-03-15 ENCOUNTER — Encounter: Payer: Self-pay | Admitting: Certified Nurse Midwife

## 2019-03-15 LAB — CYTOLOGY - PAP
Diagnosis: NEGATIVE
HPV: NOT DETECTED

## 2019-03-19 ENCOUNTER — Telehealth: Payer: Self-pay

## 2019-03-19 NOTE — Telephone Encounter (Signed)
Called patient to give bone density results & no voicemail. Try again.

## 2019-03-20 NOTE — Telephone Encounter (Signed)
Patient notified of results.

## 2019-07-15 ENCOUNTER — Other Ambulatory Visit: Payer: Self-pay | Admitting: Cardiology

## 2019-09-18 ENCOUNTER — Ambulatory Visit (INDEPENDENT_AMBULATORY_CARE_PROVIDER_SITE_OTHER): Payer: Medicare Other | Admitting: Cardiology

## 2019-09-18 ENCOUNTER — Other Ambulatory Visit: Payer: Self-pay

## 2019-09-18 ENCOUNTER — Encounter: Payer: Self-pay | Admitting: Cardiology

## 2019-09-18 VITALS — BP 125/67 | HR 63 | Temp 97.4°F | Ht 62.0 in | Wt 193.6 lb

## 2019-09-18 DIAGNOSIS — E782 Mixed hyperlipidemia: Secondary | ICD-10-CM

## 2019-09-18 DIAGNOSIS — G4733 Obstructive sleep apnea (adult) (pediatric): Secondary | ICD-10-CM

## 2019-09-18 DIAGNOSIS — I480 Paroxysmal atrial fibrillation: Secondary | ICD-10-CM

## 2019-09-18 DIAGNOSIS — I10 Essential (primary) hypertension: Secondary | ICD-10-CM | POA: Diagnosis not present

## 2019-09-18 DIAGNOSIS — Z9989 Dependence on other enabling machines and devices: Secondary | ICD-10-CM

## 2019-09-18 NOTE — Progress Notes (Signed)
Primary Physician/Referring:  Burnard Bunting, MD  Patient ID: Felicia Franklin, female    DOB: 12-23-1945, 73 y.o.   MRN: [id number]  Chief Complaint  Patient presents with  . Atrial Fibrillation  . Follow-up    73yr  HPI:    Felicia Franklin is a 73y.o. Caucasian female with history of hypertension and hyperlipidemia and asymptomatic paroxysmal atrial fibrillation and moderate obesity with obstructive sleep apnea and unable to tolerate CPAP.She is on Eliquis and doing well without signs/symptoms of bleeding. Patient presents to me for annual follow-up and evaluation of paroxysmal atrial fibrillation.   She is presently doing well. Tolerating all her medications well. 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 She denies any dizziness or syncope, denies symptoms to suggest TIA or claudication.   Doing well and has not had palpitations, leg edema, or worsening dyspnea.  Activity reduced due to DJD.   Past Medical History:  Diagnosis Date  . Anxiety   . Arthritis   . Essential hypertension   . Paroxysmal atrial fibrillation (HCC)   . PONV (postoperative nausea and vomiting)    severe-also can get agitated waking up  . Sleep apnea   . Wears glasses    Past Surgical History:  Procedure Laterality Date  . CARPAL TUNNEL RELEASE Left 2007  . CARPAL TUNNEL RELEASE Right 1992  . CARPOMETACARPAL ([facility]) FUSION OF THUMB Right 11/26/2013   Procedure: CARPOMETACARPAL ([facility]USION [facility] THUMB;  Surgeon: RCammie Sickle, MD;  [place]  Service: Orthopedics;  Laterality: Right;  . COLONOSCOPY    . FOOT SURGERY Left 2005   bunionectomy-osteotomy   Social History   Socioeconomic History  . Marital status: Divorced    Spouse name: Not on file  . Number of children: 1  . Years of education: Not on file  . Highest education level: Not on file  Occupational History  . Occupation: retired  Tobacco Use  . Smoking status: Never  Smoker  . Smokeless tobacco: Never Used  Substance and Sexual Activity  . Alcohol use: No    Alcohol/week: 0.0 standard drinks  . Drug use: No  . Sexual activity: Not Currently    Partners: Male    Birth control/protection: Post-menopausal  Other Topics Concern  . Not on file  Social History Narrative  . Not on file   Social Determinants of Health   Financial Resource Strain:   . Difficulty of Paying Living Expenses: Not on file  Food Insecurity:   . Worried About RCharity fundraiserin the Last Year: Not on file  . Ran Out of Food in the Last Year: Not on file  Transportation Needs:   . Lack of Transportation (Medical): Not on file  . Lack of Transportation (Non-Medical): Not on file  Physical Activity:   . Days of Exercise per Week: Not on file  . Minutes of Exercise per Session: Not on file  Stress:   . Feeling of Stress : Not on file  Social Connections:   . Frequency of Communication with Friends and Family: Not on file  . Frequency of Social Gatherings with Friends and Family: Not on file  . Attends Religious Services: Not on file  . Active Member of Clubs or Organizations: Not on file  . Attends CArchivistMeetings: Not on file  . Marital Status: Not on file  Intimate Partner Violence:   . Fear of Current or  Ex-Partner: Not on file  . Emotionally Abused: Not on file  . Physically Abused: Not on file  . Sexually Abused: Not on file   ROS  Review of Systems  Constitution: Positive for weight gain. Negative for chills, decreased appetite and malaise/fatigue.  Cardiovascular: Negative for dyspnea on exertion, leg swelling and syncope.  Endocrine: Negative for cold intolerance.  Hematologic/Lymphatic: Does not bruise/bleed easily.  Musculoskeletal: Positive for back pain (chronic) and muscle cramps (right abductor tendon tear). Negative for joint swelling.  Gastrointestinal: Negative for abdominal pain, anorexia, change in bowel habit, hematochezia and  melena.  Neurological: Negative for headaches and light-headedness.  Psychiatric/Behavioral: Negative for depression and substance abuse.  All other systems reviewed and are negative.  Objective  Blood pressure 125/67, pulse 63, temperature (!) 97.4 F (36.3 C), height _0  (1.575 m), weight 193 lb 9.6 oz (87.8 kg), last menstrual period 10/03/1998, SpO2 97 %.  Vitals with BMI 09/18/2019 03/13/2019 12/15/2017  Height _1  5' 1.5" 5' 1.25"  Weight 193 lbs 10 oz 187 lbs 186 lbs  BMI 35.4 47.42 59.56  Systolic 387 564 332  Diastolic 67 78 80  Pulse 63 70 70     Physical Exam  Constitutional:  She has short stature, moderately obese and in no acute distress.  HENT:  Head: Atraumatic.  Eyes: Conjunctivae are normal.  Neck: No JVD present. No thyromegaly present.  Cardiovascular: Normal rate, regular rhythm, normal heart sounds and intact distal pulses. Exam reveals no gallop.  No murmur heard. No leg edema, no JVD.  Pulmonary/Chest: Effort normal and breath sounds normal.  Abdominal: Soft. Bowel sounds are normal.  Musculoskeletal:        General: Normal range of motion.     Cervical back: Neck supple.  Neurological: She is alert.  Skin: Skin is warm and dry.  Psychiatric: She has a normal mood and affect.   Laboratory examination:   No results for input(s): NA, K, CL, CO2, GLUCOSE, BUN, CREATININE, CALCIUM, GFRNONAA, GFRAA in the last 8760 hours. CrCl cannot be calculated (Patient's most recent lab result is older than the maximum 21 days allowed.).  CMP Latest Ref Rng & Units 11/21/2013  Glucose 70 - 99 mg/dL 91  BUN 6 - 23 mg/dL 19  Creatinine 0.50 - 1.10 mg/dL 0.68  Sodium 137 - 147 mEq/L 143  Potassium 3.7 - 5.3 mEq/L 5.0  Chloride 96 - 112 mEq/L 104  CO2 19 - 32 mEq/L 24  Calcium 8.4 - 10.5 mg/dL 10.1   CBC Latest Ref Rng & Units 11/26/2013  Hemoglobin 12.0 - 15.0 g/dL 14.2   Lipid Panel  No results found for: CHOL, TRIG, HDL, CHOLHDL, VLDL, LDLCALC,  LDLDIRECT HEMOGLOBIN A1C No results found for: HGBA1C, MPG TSH No results for input(s): TSH in the last 8760 hours.  Labs 07/04/2018: Serum glucose 91 mg, BUN 17, creatinine 0.8, eGFR greater than 61, potassium 4.5.  HB 13.0/HCT 40.9, platelets 236, normal indicis.  TSH 1.31, normal.  Total cholesterol 197, triglycerides 85, HDL 66, LDL 114.  Non-HDL cholesterol 131.  Medications and allergies   Allergies  Allergen Reactions  . Triple Antibiotic [Bacitracin-Neomycin-Polymyxin] Rash     Current Outpatient Medications  Medication Instructions  . aliskiren (TEKTURNA) 300 mg, Daily  . amLODipine (NORVASC) 10 mg, Daily  . Ascorbic Acid (VITAMIN C PO) 500 mg, Oral, Daily  . calcium gluconate 500 MG tablet 1 tablet, Oral, Daily  . Cholecalciferol (VITAMIN D PO) 3,000 Units, Oral, Daily  . Docusate  Sodium (COLACE PO) Oral  . ELIQUIS 5 MG TABS tablet TAKE 1 TABLET(5 MG) BY MOUTH TWICE DAILY  . Multiple Vitamins-Minerals (CENTRUM SILVER PO) 1 tablet, Daily  . nebivolol (BYSTOLIC) 10 mg, Daily  . Polyethylene Glycol 3350 (MIRALAX PO) Oral, Daily  . simvastatin (ZOCOR) 40 mg, Every evening  . spironolactone (ALDACTONE) 25 MG tablet TAKE 1 TABLET BY MOUTH EVERY MORNING  . UNABLE TO FIND Daily, Move free ultra     Radiology:  No results found.  Cardiac Studies:   Echo- 04/04/13:  Study is somewhat technically limited due to Obesity. Left ventricular cavity is normal in size.   Mild concentric hypertrophy.   Normal global wall motion.   Normal systolic global function.   Calculated EF 55%.   Doppler evidence of Grade II (pseudonormal) diastolic dysfunction. Left atrial cavity is mildly dilated.  Lexiscan Myoview stress test 03/29/2013: 1. Resting EKG NSR, Poor R wave progression. Cannot exclude anterior infarct, old. Stress EKG was non diagnostic for ischemia. No ST-T changes of ischemia noted with pharmacologic stress testing. Stress symptoms included lightheadedness. Stress terminated due to  completion of protocol. 2. The perfusion imaging study demonstrated very mild inferior wall breast attenuation artifact( patient scanned sitting).  There was no definite ischemia or scar.  Perfusion improved with stress images.  Dynamic gated images reveal normal wall motion and endocardial thickening. Left ventricular ejection fraction was estimated to be 79%. 3. This represents a low risk study.  Assessment     ICD-10-CM   1. Paroxysmal atrial fibrillation (HCC)  I48.0 EKG 12-Lead   CHA2DS2-VASc Score is 3 with yearly risk of stroke of 3.2% .  2. Essential hypertension  I10   3. Obstructive sleep apnea on CPAP  G47.33    Z99.89   4. Mixed dyslipidemia  E78.2     EKG 09/18/2019: Normal sinus rhythm at the rate of 61 bpm, normal axis, poor R-wave progression, cannot exclude anteroseptal infarct old.  No evidence of ischemia.  Normal QT interval.  Borderline low voltage complexes.  No significant change from 11/21/2017.   Recommendations:  No orders of the defined types were placed in this encounter.   Felicia Franklin  is a 73 y.o. Caucasian female with history of hypertension and hyperlipidemia and asymptomatic paroxysmal atrial fibrillation and moderate obesity with obstructive sleep apnea and unable to tolerate CPAP.She is on Eliquis and doing well without signs/symptoms of bleeding. Patient presents to me for annual follow-up and evaluation of paroxysmal atrial fibrillation.   Patient is here on annual visit, presently doing well, unfortunately has gained of atrial tachycardia.  Continues to have chronic back pain and now has injured her right thigh.  No changes in the medications were done today.  Blood Pressure is well controlled, she maintains sinus rhythm.  She's been unable to tolerate CPAP.  I will see her back on an annual basis.  I do not have a recent labs.  Patient states that her blood count, renal function has been normal and stable as per Dr. Reynaldo Minium.  Adrian Prows, MD,  [facility] 09/18/2019, 2:00 PM [facility] Pager: [phone] Office: [phone]

## 2019-11-17 ENCOUNTER — Ambulatory Visit: Payer: Medicare PPO | Attending: Internal Medicine

## 2019-11-17 DIAGNOSIS — Z23 Encounter for immunization: Secondary | ICD-10-CM | POA: Insufficient documentation

## 2019-11-17 NOTE — Progress Notes (Signed)
   Covid-19 Vaccination Clinic  Name:  Felicia Franklin    MRN: [id number] DOB: 04/30/1946  11/17/2019  [name] was observed post Covid-19 immunization for 15 minutes without incidence. She was provided with Vaccine Information Sheet and instruction to access the V-Safe system.   [name] was instructed to call 911 with any severe reactions post vaccine: Marland Kitchen Difficulty breathing  . Swelling of your face and throat  . A fast heartbeat  . A bad rash all over your body  . Dizziness and weakness    Immunizations Administered    Name Date Dose VIS Date Route   Pfizer COVID-19 Vaccine 11/17/2019  8:56 AM 0.3 mL 09/13/2019 Intramuscular   Manufacturer: [employer], [employer]   Lot: [id number]   NDC: [address]-0

## 2019-11-25 ENCOUNTER — Encounter: Payer: Self-pay | Admitting: Cardiology

## 2019-12-09 ENCOUNTER — Ambulatory Visit: Payer: Medicare PPO | Attending: Internal Medicine

## 2019-12-09 DIAGNOSIS — Z23 Encounter for immunization: Secondary | ICD-10-CM | POA: Insufficient documentation

## 2019-12-09 NOTE — Progress Notes (Signed)
   Covid-19 Vaccination Clinic  Name:  Felicia Franklin    MRN: [id number] DOB: June 30, 1946  12/09/2019  Felicia Franklin was observed post Covid-19 immunization for 15 minutes without incident. She was provided with Vaccine Information Sheet and instruction to access the V-Safe system.   Felicia Franklin was instructed to call 911 with any severe reactions post vaccine: Marland Kitchen Difficulty breathing  . Swelling of face and throat  . A fast heartbeat  . A bad rash all over body  . Dizziness and weakness   Immunizations Administered    Name Date Dose VIS Date Route   Pfizer COVID-19 Vaccine 12/09/2019  5:33 PM 0.3 mL 09/13/2019 Intramuscular   Manufacturer: [employer], [employer]   Lot: [id number]   NDC: [address]-1

## 2019-12-24 ENCOUNTER — Encounter: Payer: Self-pay | Admitting: Certified Nurse Midwife

## 2019-12-28 ENCOUNTER — Other Ambulatory Visit: Payer: Self-pay | Admitting: Cardiology

## 2020-03-03 DIAGNOSIS — E669 Obesity, unspecified: Secondary | ICD-10-CM | POA: Diagnosis not present

## 2020-03-03 DIAGNOSIS — Z6836 Body mass index (BMI) 36.0-36.9, adult: Secondary | ICD-10-CM | POA: Diagnosis not present

## 2020-03-03 DIAGNOSIS — Z713 Dietary counseling and surveillance: Secondary | ICD-10-CM | POA: Diagnosis not present

## 2020-03-06 DIAGNOSIS — M419 Scoliosis, unspecified: Secondary | ICD-10-CM | POA: Diagnosis not present

## 2020-03-06 DIAGNOSIS — M4180 Other forms of scoliosis, site unspecified: Secondary | ICD-10-CM | POA: Diagnosis not present

## 2020-03-06 DIAGNOSIS — M545 Low back pain: Secondary | ICD-10-CM | POA: Diagnosis not present

## 2020-03-06 DIAGNOSIS — R293 Abnormal posture: Secondary | ICD-10-CM | POA: Diagnosis not present

## 2020-03-13 ENCOUNTER — Ambulatory Visit: Payer: Medicare Other | Admitting: Certified Nurse Midwife

## 2020-03-16 DIAGNOSIS — Z6836 Body mass index (BMI) 36.0-36.9, adult: Secondary | ICD-10-CM | POA: Diagnosis not present

## 2020-03-16 DIAGNOSIS — E669 Obesity, unspecified: Secondary | ICD-10-CM | POA: Diagnosis not present

## 2020-03-20 DIAGNOSIS — M545 Low back pain: Secondary | ICD-10-CM | POA: Diagnosis not present

## 2020-03-20 DIAGNOSIS — M4180 Other forms of scoliosis, site unspecified: Secondary | ICD-10-CM | POA: Diagnosis not present

## 2020-03-20 DIAGNOSIS — M419 Scoliosis, unspecified: Secondary | ICD-10-CM | POA: Diagnosis not present

## 2020-03-20 DIAGNOSIS — R293 Abnormal posture: Secondary | ICD-10-CM | POA: Diagnosis not present

## 2020-03-27 DIAGNOSIS — M4180 Other forms of scoliosis, site unspecified: Secondary | ICD-10-CM | POA: Diagnosis not present

## 2020-03-27 DIAGNOSIS — M545 Low back pain: Secondary | ICD-10-CM | POA: Diagnosis not present

## 2020-03-27 DIAGNOSIS — R293 Abnormal posture: Secondary | ICD-10-CM | POA: Diagnosis not present

## 2020-03-27 DIAGNOSIS — M419 Scoliosis, unspecified: Secondary | ICD-10-CM | POA: Diagnosis not present

## 2020-03-30 ENCOUNTER — Ambulatory Visit: Payer: Medicare PPO | Admitting: Neurology

## 2020-03-30 ENCOUNTER — Encounter: Payer: Self-pay | Admitting: Neurology

## 2020-03-30 VITALS — BP 144/86 | HR 80 | Ht 62.0 in | Wt 195.3 lb

## 2020-03-30 DIAGNOSIS — M47816 Spondylosis without myelopathy or radiculopathy, lumbar region: Secondary | ICD-10-CM | POA: Diagnosis not present

## 2020-03-30 DIAGNOSIS — G6181 Chronic inflammatory demyelinating polyneuritis: Secondary | ICD-10-CM

## 2020-03-30 NOTE — Progress Notes (Signed)
Subjective:    Patient ID: Felicia Franklin is a 74 y.o. female.  HPI     Huston FoleySaima Thekla Colborn, MD, PhD [facility] [address], [address] [address] [place], [place] [id number]  Dear Dr. Ethelene Halamos,   I saw your patient, Felicia Franklin, upon your kind request, in my neurologic clinic today for initial consultation of her degenerative lumbar spine disease. The patient is unaccompanied today. As you know, Felicia Franklin is a 74 year old right-handed woman with an underlying medical history of hypertension, arthritis, anxiety, paroxysmal A. fib, sleep apnea, carpal tunnel syndrome with status post surgeries bilaterally, also CMC fusion of the right hand, degenerative scoliosis and obesity, who reports chronic midline low back pain, no actual radiation into 1 leg, no sciatica type symptoms.  She denies any significant numbness in her feet or hands, has had prior carpal tunnel surgery and some numbness in her left hand from time to time.  She feels like her leg wants to give out at times, particularly the right.  She has a remote diagnosis of CIDP.  She reports that she was diagnosed in the 90s and [facility] and participated in a drug trial with IVIG versus placebo, she is not sure if she got the actual drug and she eventually stopped going to the doctor.  She feels that from that standpoint her symptoms have been stable and she would rather not embark on any additional treatment for CIDP.  She has not fallen recently thankfully.  She has worried at times about Parkinson's.  She has noticed that she clenches her teeth at times and her dentist also told her that she may grind her teeth.  She tries to hydrate well, she denies any day-to-day caffeine, denies any alcohol intake, non-smoker.  She does not have any burning sensation or pain in her feet.  She does not use a walking aid.  Sometimes she feels a tightness in the right thigh and hip area, this has been since she tore a  tendon last year.  I reviewed your office records. She had recent imaging test. Spinal cord stimulator placement was discussed as well. She had epidural steroid injection on 01/14/2020 with minimal relief. She had a lumbar spine MRI on 11/03/2018 Through your office and I reviewed the results: Multilevel degenerative changes of the lumbar spine with a levoscoliosis centered at L3. the degenerative changes are most prominent at L4-5. At L4-5, mild central canal stenosis and mild right lateral recess stenosis. Moderate right neural foraminal narrowing with probable encroachment on the right L4 dorsal root ganglion. Advanced facet arthrosis with 5 mm anterolisthesis of L4 on L5. At L5-S1, mild to moderate left neural foraminal narrowing with probable encroachment on the left L5 dorsal root ganglion. Advised degenerative disc disease. At L1 2-3 advanced degenerative disc disease. No neural displacement or encroachment. She also had a MRI of the pelvis on 05/27/2019 and I reviewed the results: Impression: High-grade attrition and longitudinal tearing of the right abductor tendons with surrounding soft tissue edema/inflammation and muscular atrophy. Symmetric bilateral hip osteoarthrosis with a complex posterior lateral perilabral cyst on the left. Lumbosacral and sacrococcygeal spondylosis with mild to moderate left foraminal stenosis at L5-S1.  Her Past Medical History Is Significant For: Past Medical History:  Diagnosis Date  . Anxiety   . Arthritis   . Essential hypertension   . Paroxysmal atrial fibrillation (HCC)   . PONV (postoperative nausea and vomiting)    severe-also can get agitated waking  up  . Sleep apnea   . Wears glasses     Her Past Surgical History Is Significant For: Past Surgical History:  Procedure Laterality Date  . CARPAL TUNNEL RELEASE Left 2007  . CARPAL TUNNEL RELEASE Right 1992  . CARPOMETACARPAL (CMC) FUSION OF THUMB Right 11/26/2013   Procedure: CARPOMETACARPAL [facility]) FUSION  OF THUMB;  Surgeon: Wyn Forster., MD;  Location: [facility];  Service: Orthopedics;  Laterality: Right;  . COLONOSCOPY    . FOOT SURGERY Left 2005   bunionectomy-osteotomy    Her Family History Is Significant For: Family History  Problem Relation Age of Onset  . Leukemia Father   . Colon cancer Father   . Diabetes Father   . Prostate cancer Father   . Hypertension Father   . Breast cancer Mother 77  . Hypertension Mother   . Hyperlipidemia Other   . COPD Other   . Cancer Other   . Obesity Other     Her Social History Is Significant For: Social History   Socioeconomic History  . Marital status: Divorced    Spouse name: Not on file  . Number of children: 1  . Years of education: Not on file  . Highest education level: Not on file  Occupational History  . Occupation: retired  Tobacco Use  . Smoking status: Never Smoker  . Smokeless tobacco: Never Used  Substance and Sexual Activity  . Alcohol use: No    Alcohol/week: 0.0 standard drinks  . Drug use: No  . Sexual activity: Not Currently    Partners: Male    Birth control/protection: Post-menopausal  Other Topics Concern  . Not on file  Social History Narrative  . Not on file   Social Determinants of Health   Financial Resource Strain:   . Difficulty of Paying Living Expenses:   Food Insecurity:   . Worried About Programme researcher, broadcasting/film/video in the Last Year:   . Barista in the Last Year:   Transportation Needs:   . Freight forwarder (Medical):   Marland Kitchen Lack of Transportation (Non-Medical):   Physical Activity:   . Days of Exercise per Week:   . Minutes of Exercise per Session:   Stress:   . Feeling of Stress :   Social Connections:   . Frequency of Communication with Friends and Family:   . Frequency of Social Gatherings with Friends and Family:   . Attends Religious Services:   . Active Member of Clubs or Organizations:   . Attends Banker Meetings:   Marland Kitchen Marital  Status:     Her Allergies Are:  Allergies  Allergen Reactions  . Triple Antibiotic [Bacitracin-Neomycin-Polymyxin] Rash  :   Her Current Medications Are:  Outpatient Encounter Medications as of 03/30/2020  Medication Sig  . aliskiren (TEKTURNA) 300 MG tablet Take 300 mg by mouth daily.  Marland Kitchen amLODipine (NORVASC) 10 MG tablet Take 10 mg by mouth daily.  . Ascorbic Acid (VITAMIN C PO) Take 500 mg by mouth daily.   . calcium carbonate (OS-CAL - DOSED IN MG OF ELEMENTAL CALCIUM) 1250 (500 Ca) MG tablet Take 1 tablet by mouth daily with breakfast.  . calcium gluconate 500 MG tablet Take 1 tablet by mouth daily.  . Cholecalciferol (VITAMIN D PO) Take 3,000 Units by mouth daily.   Tery Sanfilippo Sodium (COLACE PO) Take by mouth.  Everlene Balls 5 MG TABS tablet TAKE 1 TABLET(5 MG) BY MOUTH TWICE DAILY  . Multiple  Vitamins-Minerals (CENTRUM SILVER PO) Take 1 tablet by mouth daily.  . nebivolol (BYSTOLIC) 10 MG tablet Take 10 mg by mouth daily.  . Polyethylene Glycol 3350 (MIRALAX PO) Take by mouth daily.   . simvastatin (ZOCOR) 40 MG tablet Take 40 mg by mouth every evening.  Marland Kitchen spironolactone (ALDACTONE) 25 MG tablet TAKE 1 TABLET BY MOUTH EVERY MORNING  . VYVANSE 70 MG capsule    No facility-administered encounter medications on file as of 03/30/2020.  :   Review of Systems:  Out of a complete 14 point review of systems, all are reviewed and negative with the exception of these symptoms as listed below:   Review of Systems  Neurological:       Here to discuss degenerative disc disease. Pt reports hx of CIDP many years ago-- was part of a clinical study several years ago. She also sts trouble with leg movements.     Objective:  Neurological Exam  Physical Exam Physical Examination:   Vitals:   03/30/20 1438  BP: (!) 144/86  Pulse: 80  SpO2: 97%   General Examination: The patient is a very pleasant 74 y.o. female in no acute distress. She appears well-developed and well-nourished and  well groomed.   HEENT: Normocephalic, atraumatic, pupils are equal, round and reactive to light and accommodation. Extraocular tracking is good without limitation to gaze excursion or nystagmus noted. Normal smooth pursuit is noted. Hearing is grossly intact. Face is symmetric with normal facial animation and normal facial sensation. Speech is clear with no dysarthria noted. There is no hypophonia. There is no lip, neck/head, jaw or voice tremor. Neck is supple with full range of passive and active motion. There are no carotid bruits on auscultation. Oropharynx exam reveals: moderate mouth dryness, adequate dental hygiene. Tongue protrudes centrally and palate elevates symmetrically.   Chest: Clear to auscultation without wheezing, rhonchi or crackles noted.  Heart: S1+S2+0, regular and normal without murmurs, rubs or gallops noted.   Abdomen: Soft, non-tender and non-distended with normal bowel sounds appreciated on auscultation.  Extremities: There is trace pitting edema in the distal lower extremities bilaterally. Pedal pulses are intact.  Skin: Warm and dry without trophic changes noted.  Musculoskeletal: exam reveals no obvious joint deformities, tenderness or joint swelling or erythema.   Neurologically:  Mental status: The patient is awake, alert and oriented in all 4 spheres. Her immediate and remote memory, attention, language skills and fund of knowledge are appropriate. There is no evidence of aphasia, agnosia, apraxia or anomia. Speech is clear with normal prosody and enunciation. Thought process is linear. Mood is normal and affect is normal.  Cranial nerves II - XII are as described above under HEENT exam. In addition: shoulder shrug is normal with equal shoulder height noted. Motor exam: Normal bulk, strength and tone is noted. There is no drift, tremor or rebound. Reflexes are 1+ throughout, including ankle reflexes. Babinski: Toes are flexor bilaterally. Fine motor skills and  coordination: intact with normal finger taps, normal hand movements, normal rapid alternating patting, normal foot taps and normal foot agility.  Cerebellar testing: No dysmetria or intention tremor on finger to nose testing. Heel to shin is unremarkable bilaterally. There is no truncal or gait ataxia.  Sensory exam: intact to light touch, pinprick, vibration, temperature sense in the upper and lower extremities bilaterally, with the exception of slightly decreased vibration sense in the left foot.  Gait, station and balance: She stands easily. No veering to one side is noted. No leaning  to one side is noted. Posture is age-appropriate with mild scoliosis. Gait shows normal stride length and normal pace. No problems turning are noted.  Preserved arm swing, no shuffling.   Assessment and Plan:   In summary, Tatia K Hazell is a very pleasant 74 y.o.-year old female ith an underlying medical history of hypertension, arthritis, anxiety, paroxysmal A. fib, sleep apnea, carpal tunnel syndrome with status post surgeries bilaterally, also CMC fusion of the right hand, degenerative scoliosis and obesity, who presents for evaluation of her chronic low back pain, she describes no significant radicular symptoms at this time, exam not telltale for neuropathy, in fact, she has preserved reflexes.  She reports a prior diagnosis of CIDP.  I suggested we proceed with EMG nerve conduction velocity testing of the lower extremities.  If she indeed has findings supportive of CIDP, I suggested we involve a neuromuscular specialist.  She is not keen on embarking on any additional treatments at this time but is willing to proceed with testing.  We will keep her posted as to her test results by phone call for now.  She is reassured that I did not detect any evidence of parkinsonism.  She is advised to proceed with electrophysiological testing.  I answered all her questions today and she was in agreement with the plan, overall,  neurological exam was benign.   Thank you very much for allowing me to participate in the care of this nice patient. If I can be of any further assistance to you please do not hesitate to call me at [phone].  Sincerely,   Huston Foley, MD, PhD

## 2020-03-30 NOTE — Patient Instructions (Signed)
I do not detect any obvious neurological cause of your back pain, you do have degenerative changes as you know.  With regards to your CIDP, I recommend we proceed with electrical nerve and muscle testing called EMG and nerve conduction velocity, as you recall you have had this test more than once before.  If you have obvious abnormalities that would support the diagnosis of CIDP, I recommend you consider seeing a neuromuscular specialist, I can make a referral if need be.  For now, we will proceed with electrical testing and keep you posted by phone call.  I do not detect any evidence of parkinsonism on examination either.

## 2020-04-01 DIAGNOSIS — Z6836 Body mass index (BMI) 36.0-36.9, adult: Secondary | ICD-10-CM | POA: Diagnosis not present

## 2020-04-01 DIAGNOSIS — E669 Obesity, unspecified: Secondary | ICD-10-CM | POA: Diagnosis not present

## 2020-04-01 DIAGNOSIS — Z7182 Exercise counseling: Secondary | ICD-10-CM | POA: Diagnosis not present

## 2020-04-14 DIAGNOSIS — Z6836 Body mass index (BMI) 36.0-36.9, adult: Secondary | ICD-10-CM | POA: Diagnosis not present

## 2020-04-14 DIAGNOSIS — E669 Obesity, unspecified: Secondary | ICD-10-CM | POA: Diagnosis not present

## 2020-05-04 DIAGNOSIS — E669 Obesity, unspecified: Secondary | ICD-10-CM | POA: Diagnosis not present

## 2020-05-04 DIAGNOSIS — Z6836 Body mass index (BMI) 36.0-36.9, adult: Secondary | ICD-10-CM | POA: Diagnosis not present

## 2020-05-07 ENCOUNTER — Ambulatory Visit (INDEPENDENT_AMBULATORY_CARE_PROVIDER_SITE_OTHER): Payer: Medicare PPO | Admitting: Diagnostic Neuroimaging

## 2020-05-07 ENCOUNTER — Encounter: Payer: Medicare PPO | Admitting: Diagnostic Neuroimaging

## 2020-05-07 DIAGNOSIS — Z0289 Encounter for other administrative examinations: Secondary | ICD-10-CM

## 2020-05-07 DIAGNOSIS — G6181 Chronic inflammatory demyelinating polyneuritis: Secondary | ICD-10-CM | POA: Diagnosis not present

## 2020-05-07 DIAGNOSIS — M47816 Spondylosis without myelopathy or radiculopathy, lumbar region: Secondary | ICD-10-CM

## 2020-05-08 NOTE — Procedures (Signed)
[facility]  NCS (NERVE CONDUCTION STUDY) WITH EMG (ELECTROMYOGRAPHY) REPORT   STUDY DATE: 05/07/20 PATIENT NAME: Felicia Franklin DOB: 1946/05/10 MRN: [id number]  ORDERING CLINICIAN: Huston Foley, MD PhD   TECHNOLOGIST: Durenda Age ELECTROMYOGRAPHER: Glenford Bayley. Eleora Sutherland, MD  CLINICAL INFORMATION: 74 year old female with lower extremity weakness.  History of CIDP in the 1990s.  FINDINGS: NERVE CONDUCTION STUDY: Bilateral peroneal and tibial motor responses are normal.  Bilateral sural and superficial peroneal sensory responses are normal.  Bilateral tibial F wave latencies are normal.   NEEDLE ELECTROMYOGRAPHY:  Needle examination of right lower extremity vastus medialis, tibialis anterior, gastrocnemius is normal.   IMPRESSION:   Normal study.  No electrodiagnostic evidence of large fiber neuropathy or myopathy at this time.    INTERPRETING PHYSICIAN:  Suanne Marker, MD Certified in Neurology, Neurophysiology and Neuroimaging  [facility] [address] [place], [place] [address] [phone]   [facility]    Nerve / Sites Muscle Latency Ref. Amplitude Ref. Rel Amp Segments Distance Velocity Ref. Area    ms ms mV mV %  cm m/s m/s mVms  L Peroneal - EDB     Ankle EDB 4.6 ?6.5 2.0 ?2.0 100 Ankle - EDB 9   5.9     Fib head EDB 9.9  1.6  81.1 Fib head - Ankle 27 51 ?44 5.3     Pop fossa EDB 11.9  1.6  100 Pop fossa - Fib head 10 50 ?44 5.3         Pop fossa - Ankle      R Peroneal - EDB     Ankle EDB 4.7 ?6.5 2.5 ?2.0 100 Ankle - EDB 9   6.9     Fib head EDB 10.5  2.2  91.4 Fib head - Ankle 27 47 ?44 7.1     Pop fossa EDB 12.7  1.7  74.5 Pop fossa - Fib head 10 47 ?44 5.0         Pop fossa - Ankle      L Tibial - AH     Ankle AH 3.7 ?5.8 8.4 ?4.0 100 Ankle - AH 9   17.2     Pop fossa AH 11.9  6.3  75.8 Pop fossa - Ankle 34 42 ?41 14.1  R Tibial - AH     Ankle AH 3.9 ?5.8 7.2 ?4.0 100 Ankle - AH 9   13.0     Pop fossa  AH 12.0  5.9  81.5 Pop fossa - Ankle 35 43 ?41 11.2             SNC    Nerve / Sites Rec. Site Peak Lat Ref.  Amp Ref. Segments Distance    ms ms V V  cm  L Sural - Ankle (Calf)     Calf Ankle 4.4 ?4.4 7 ?6 Calf - Ankle 14  R Sural - Ankle (Calf)     Calf Ankle 2.7 ?4.4 9 ?6 Calf - Ankle 14  L Superficial peroneal - Ankle     Lat leg Ankle 3.5 ?4.4 7 ?6 Lat leg - Ankle 14  R Superficial peroneal - Ankle     Lat leg Ankle 3.3 ?4.4 6 ?6 Lat leg - Ankle 14             F  Wave    Nerve F Lat Ref.   ms ms  L Tibial - AH 50.4 ?56.0  R Tibial - AH  50.4 ?56.0         EMG Summary Table    Spontaneous MUAP Recruitment  Muscle IA Fib PSW Fasc Other Amp Dur. Poly Pattern  R. Vastus medialis Normal None None None _______ Normal Normal Normal Normal  R. Tibialis anterior Normal None None None _______ Normal Normal Normal Normal  R. Gastrocnemius (Medial head) Normal None None None _______ Normal Normal Normal Normal

## 2020-05-11 ENCOUNTER — Ambulatory Visit: Payer: Medicare PPO | Admitting: Obstetrics and Gynecology

## 2020-05-11 DIAGNOSIS — Z6836 Body mass index (BMI) 36.0-36.9, adult: Secondary | ICD-10-CM | POA: Diagnosis not present

## 2020-05-11 DIAGNOSIS — E669 Obesity, unspecified: Secondary | ICD-10-CM | POA: Diagnosis not present

## 2020-05-19 DIAGNOSIS — H524 Presbyopia: Secondary | ICD-10-CM | POA: Diagnosis not present

## 2020-05-19 DIAGNOSIS — H25013 Cortical age-related cataract, bilateral: Secondary | ICD-10-CM | POA: Diagnosis not present

## 2020-05-19 DIAGNOSIS — H35363 Drusen (degenerative) of macula, bilateral: Secondary | ICD-10-CM | POA: Diagnosis not present

## 2020-05-19 DIAGNOSIS — H2513 Age-related nuclear cataract, bilateral: Secondary | ICD-10-CM | POA: Diagnosis not present

## 2020-05-19 DIAGNOSIS — H35033 Hypertensive retinopathy, bilateral: Secondary | ICD-10-CM | POA: Diagnosis not present

## 2020-05-20 DIAGNOSIS — Z1231 Encounter for screening mammogram for malignant neoplasm of breast: Secondary | ICD-10-CM | POA: Diagnosis not present

## 2020-06-05 DIAGNOSIS — Z7182 Exercise counseling: Secondary | ICD-10-CM | POA: Diagnosis not present

## 2020-06-05 DIAGNOSIS — E669 Obesity, unspecified: Secondary | ICD-10-CM | POA: Diagnosis not present

## 2020-06-05 DIAGNOSIS — Z6836 Body mass index (BMI) 36.0-36.9, adult: Secondary | ICD-10-CM | POA: Diagnosis not present

## 2020-06-16 DIAGNOSIS — E669 Obesity, unspecified: Secondary | ICD-10-CM | POA: Diagnosis not present

## 2020-06-16 DIAGNOSIS — I10 Essential (primary) hypertension: Secondary | ICD-10-CM | POA: Diagnosis not present

## 2020-06-16 DIAGNOSIS — I4891 Unspecified atrial fibrillation: Secondary | ICD-10-CM | POA: Diagnosis not present

## 2020-06-16 DIAGNOSIS — Z6836 Body mass index (BMI) 36.0-36.9, adult: Secondary | ICD-10-CM | POA: Diagnosis not present

## 2020-06-16 DIAGNOSIS — Z7901 Long term (current) use of anticoagulants: Secondary | ICD-10-CM | POA: Diagnosis not present

## 2020-06-16 DIAGNOSIS — R632 Polyphagia: Secondary | ICD-10-CM | POA: Diagnosis not present

## 2020-06-26 ENCOUNTER — Other Ambulatory Visit: Payer: Self-pay | Admitting: Cardiology

## 2020-07-01 DIAGNOSIS — E669 Obesity, unspecified: Secondary | ICD-10-CM | POA: Diagnosis not present

## 2020-07-01 DIAGNOSIS — Z6836 Body mass index (BMI) 36.0-36.9, adult: Secondary | ICD-10-CM | POA: Diagnosis not present

## 2020-07-15 DIAGNOSIS — E785 Hyperlipidemia, unspecified: Secondary | ICD-10-CM | POA: Diagnosis not present

## 2020-07-15 DIAGNOSIS — I10 Essential (primary) hypertension: Secondary | ICD-10-CM | POA: Diagnosis not present

## 2020-07-16 DIAGNOSIS — Z6836 Body mass index (BMI) 36.0-36.9, adult: Secondary | ICD-10-CM | POA: Diagnosis not present

## 2020-07-16 DIAGNOSIS — E669 Obesity, unspecified: Secondary | ICD-10-CM | POA: Diagnosis not present

## 2020-07-22 DIAGNOSIS — I480 Paroxysmal atrial fibrillation: Secondary | ICD-10-CM | POA: Diagnosis not present

## 2020-07-22 DIAGNOSIS — E669 Obesity, unspecified: Secondary | ICD-10-CM | POA: Diagnosis not present

## 2020-07-22 DIAGNOSIS — E785 Hyperlipidemia, unspecified: Secondary | ICD-10-CM | POA: Diagnosis not present

## 2020-07-22 DIAGNOSIS — Z0000 Encounter for general adult medical examination without abnormal findings: Secondary | ICD-10-CM | POA: Diagnosis not present

## 2020-07-22 DIAGNOSIS — F329 Major depressive disorder, single episode, unspecified: Secondary | ICD-10-CM | POA: Diagnosis not present

## 2020-07-22 DIAGNOSIS — I872 Venous insufficiency (chronic) (peripheral): Secondary | ICD-10-CM | POA: Diagnosis not present

## 2020-07-22 DIAGNOSIS — M1990 Unspecified osteoarthritis, unspecified site: Secondary | ICD-10-CM | POA: Diagnosis not present

## 2020-07-22 DIAGNOSIS — I10 Essential (primary) hypertension: Secondary | ICD-10-CM | POA: Diagnosis not present

## 2020-07-22 DIAGNOSIS — K219 Gastro-esophageal reflux disease without esophagitis: Secondary | ICD-10-CM | POA: Diagnosis not present

## 2020-07-24 DIAGNOSIS — Z1212 Encounter for screening for malignant neoplasm of rectum: Secondary | ICD-10-CM | POA: Diagnosis not present

## 2020-07-27 DIAGNOSIS — E669 Obesity, unspecified: Secondary | ICD-10-CM | POA: Diagnosis not present

## 2020-07-27 DIAGNOSIS — Z6836 Body mass index (BMI) 36.0-36.9, adult: Secondary | ICD-10-CM | POA: Diagnosis not present

## 2020-08-24 DIAGNOSIS — I4891 Unspecified atrial fibrillation: Secondary | ICD-10-CM | POA: Diagnosis not present

## 2020-08-24 DIAGNOSIS — E669 Obesity, unspecified: Secondary | ICD-10-CM | POA: Diagnosis not present

## 2020-08-24 DIAGNOSIS — Z6836 Body mass index (BMI) 36.0-36.9, adult: Secondary | ICD-10-CM | POA: Diagnosis not present

## 2020-08-24 DIAGNOSIS — I10 Essential (primary) hypertension: Secondary | ICD-10-CM | POA: Diagnosis not present

## 2020-08-24 DIAGNOSIS — Z7901 Long term (current) use of anticoagulants: Secondary | ICD-10-CM | POA: Diagnosis not present

## 2020-08-24 DIAGNOSIS — R632 Polyphagia: Secondary | ICD-10-CM | POA: Diagnosis not present

## 2020-09-15 DIAGNOSIS — L821 Other seborrheic keratosis: Secondary | ICD-10-CM | POA: Diagnosis not present

## 2020-09-15 DIAGNOSIS — L918 Other hypertrophic disorders of the skin: Secondary | ICD-10-CM | POA: Diagnosis not present

## 2020-09-15 DIAGNOSIS — D1801 Hemangioma of skin and subcutaneous tissue: Secondary | ICD-10-CM | POA: Diagnosis not present

## 2020-09-15 DIAGNOSIS — L820 Inflamed seborrheic keratosis: Secondary | ICD-10-CM | POA: Diagnosis not present

## 2020-09-15 DIAGNOSIS — L570 Actinic keratosis: Secondary | ICD-10-CM | POA: Diagnosis not present

## 2020-09-15 DIAGNOSIS — D224 Melanocytic nevi of scalp and neck: Secondary | ICD-10-CM | POA: Diagnosis not present

## 2020-09-17 ENCOUNTER — Ambulatory Visit: Payer: Medicare PPO | Admitting: Cardiology

## 2020-09-17 ENCOUNTER — Encounter: Payer: Self-pay | Admitting: Cardiology

## 2020-09-17 ENCOUNTER — Other Ambulatory Visit: Payer: Self-pay

## 2020-09-17 VITALS — BP 128/59 | HR 61 | Resp 16 | Ht 62.0 in | Wt 199.0 lb

## 2020-09-17 DIAGNOSIS — I10 Essential (primary) hypertension: Secondary | ICD-10-CM | POA: Diagnosis not present

## 2020-09-17 DIAGNOSIS — I480 Paroxysmal atrial fibrillation: Secondary | ICD-10-CM

## 2020-09-17 NOTE — Progress Notes (Signed)
Primary Physician/Referring:  Burnard Bunting, MD  Patient ID: Felicia Franklin, female    DOB: 1945/10/05, 74 y.o.   MRN: [id number]  Chief Complaint  Patient presents with  . Atrial Fibrillation  . Hypertension  . Follow-up    1 year    HPI:    Felicia Franklin  is a 74 y.o. Caucasian female with history of hypertension and hyperlipidemia and asymptomatic paroxysmal atrial fibrillation and moderate obesity with obstructive sleep apnea and unable to tolerate CPAP.She is on Eliquis and doing well without signs/symptoms of bleeding. Patient presents to me for annual follow-up and evaluation of paroxysmal atrial fibrillation.   She is presently doing well. Tolerating all her medications well. 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 She denies any dizziness or syncope, denies symptoms to suggest TIA or claudication.   Doing well and has not had palpitations, leg edema, or worsening dyspnea.  Activity reduced due to DJD.   Past Medical History:  Diagnosis Date  . Anxiety   . Arthritis   . Essential hypertension   . Paroxysmal atrial fibrillation (HCC)   . PONV (postoperative nausea and vomiting)    severe-also can get agitated waking up  . Sleep apnea   . Wears glasses    Past Surgical History:  Procedure Laterality Date  . CARPAL TUNNEL RELEASE Left 2007  . CARPAL TUNNEL RELEASE Right 1992  . CARPOMETACARPAL ([facility]) FUSION OF THUMB Right 11/26/2013   Procedure: CARPOMETACARPAL [facility]) FUSION OF THUMB;  Surgeon: Cammie Sickle., MD;  Location: [facility];  Service: Orthopedics;  Laterality: Right;  . COLONOSCOPY    . FOOT SURGERY Left 2005   bunionectomy-osteotomy   Social History   Tobacco Use  . Smoking status: Never Smoker  . Smokeless tobacco: Never Used  Substance Use Topics  . Alcohol use: No    Alcohol/week: 0.0 standard drinks   Marital Status: Divorced   ROS  Review of Systems  Constitutional: Positive for  weight gain. Negative for chills, decreased appetite and malaise/fatigue.  Cardiovascular: Negative for dyspnea on exertion, leg swelling and syncope.  Endocrine: Negative for cold intolerance.  Hematologic/Lymphatic: Does not bruise/bleed easily.  Musculoskeletal: Positive for back pain (chronic) and muscle cramps (right abductor tendon tear). Negative for joint swelling.  Gastrointestinal: Negative for abdominal pain, anorexia, change in bowel habit, hematochezia and melena.  Neurological: Negative for headaches and light-headedness.  Psychiatric/Behavioral: Negative for depression and substance abuse.  All other systems reviewed and are negative.  Objective  Blood pressure (!) 128/59, pulse 61, resp. rate 16, height '5\' 2"'  (1.575 m), weight 199 lb (90.3 kg), last menstrual period 10/03/1998, SpO2 97 %.  Vitals with BMI 09/17/2020 03/30/2020 09/18/2019  Height '5\' 2"'  '5\' 2"'  '5\' 2"'   Weight 199 lbs 195 lbs 5 oz 193 lbs 10 oz  BMI 36.39 76.19 50.9  Systolic 326 712 458  Diastolic 59 86 67  Pulse 61 80 63     Physical Exam Constitutional:      Comments: She has short stature, moderately obese and in no acute distress.  HENT:     Head: Atraumatic.  Eyes:     Conjunctiva/sclera: Conjunctivae normal.  Neck:     Thyroid: No thyromegaly.     Vascular: No JVD.  Cardiovascular:     Rate and Rhythm: Normal rate and regular rhythm.     Pulses: Intact distal pulses.     Heart sounds: Normal heart sounds. No murmur  heard. No gallop.      Comments: No leg edema, no JVD. Pulmonary:     Effort: Pulmonary effort is normal.     Breath sounds: Normal breath sounds.  Abdominal:     General: Bowel sounds are normal.     Palpations: Abdomen is soft.  Musculoskeletal:        General: Normal range of motion.     Cervical back: Neck supple.  Skin:    General: Skin is warm and dry.  Neurological:     Mental Status: She is alert.    Laboratory examination:   External labs:  Cholesterol, total  198.000 m 06/03/2019 HDL 60 MG/DL 06/03/2019 LDL 123.000 m 06/03/2019 Triglycerides 77.000 06/03/2019  Hemoglobin 13.500 g/d 06/03/2019  Creatinine, Serum 0.900 mg/ 06/03/2019  ALT (SGPT) 17.000 uni 06/03/2019  TSH 1.760 06/03/2019  Labs 07/04/2018: Serum glucose 91 mg, BUN 17, creatinine 0.8, eGFR greater than 61, potassium 4.5.  HB 13.0/HCT 40.9, platelets 236, normal indicis.  TSH 1.31, normal.  Total cholesterol 197, triglycerides 85, HDL 66, LDL 114.  Non-HDL cholesterol 131.  Medications and allergies   Allergies  Allergen Reactions  . Triple Antibiotic [Bacitracin-Neomycin-Polymyxin] Rash    Current Outpatient Medications on File Prior to Visit  Medication Sig Dispense Refill  . aliskiren (TEKTURNA) 300 MG tablet Take 300 mg by mouth daily.    Marland Kitchen amLODipine (NORVASC) 10 MG tablet Take 10 mg by mouth daily.    Felicia Franklin Sodium (COLACE PO) Take by mouth.    Arne Cleveland 5 MG TABS tablet TAKE 1 TABLET(5 MG) BY MOUTH TWICE DAILY 180 tablet 1  . Multiple Vitamins-Minerals (CENTRUM SILVER PO) Take 1 tablet by mouth daily.    . nebivolol (BYSTOLIC) 10 MG tablet Take 10 mg by mouth daily.    . Polyethylene Glycol 3350 (MIRALAX PO) Take by mouth daily.     . simvastatin (ZOCOR) 40 MG tablet Take 40 mg by mouth every evening.    Marland Kitchen spironolactone (ALDACTONE) 25 MG tablet TAKE 1 TABLET BY MOUTH EVERY MORNING 90 tablet 2   No current facility-administered medications on file prior to visit.     Radiology:  No results found.  Cardiac Studies:   Echo- 04/04/13:  Study is somewhat technically limited due to Obesity. Left ventricular cavity is normal in size.   Mild concentric hypertrophy.   Normal global wall motion.   Normal systolic global function.   Calculated EF 55%.   Doppler evidence of Grade II (pseudonormal) diastolic dysfunction. Left atrial cavity is mildly dilated.  Lexiscan Myoview stress test 03/29/2013: 1. Resting EKG NSR, Poor R wave progression. Cannot exclude anterior infarct,  old. Stress EKG was non diagnostic for ischemia. No ST-T changes of ischemia noted with pharmacologic stress testing. Stress symptoms included lightheadedness. Stress terminated due to completion of protocol. 2. The perfusion imaging study demonstrated very mild inferior wall breast attenuation artifact( patient scanned sitting).  There was no definite ischemia or scar.  Perfusion improved with stress images.  Dynamic gated images reveal normal wall motion and endocardial thickening. Left ventricular ejection fraction was estimated to be 79%. 3. This represents a low risk study.  EKG:    EKG 09/17/2020: Sinus bradycardia at rate of 59 bpm, left atrial enlargement, normal axis.  Low-voltage complexes.  Nonspecific T wave flattening.  Compared to 09/18/2019, no significant change  Assessment     ICD-10-CM   1. Paroxysmal atrial fibrillation (HCC)  I48.0 EKG 12-Lead  2. Essential hypertension  I10  CHA2DS2-VASc Score is 2.  Yearly risk of stroke: 2.3% (F, Age).  Score of 1=0.6; 2=2.2; 3=3.2; 4=4.8; 5=7.2; 6=9.8; 7=>9.8) -(CHF; HTN; vasc disease DM,  Female = 1; Age <65 =0; 65-74 = 1,  >75 =2; stroke/embolism= 2).     No orders of the defined types were placed in this encounter.   Medications Discontinued During This Encounter  Medication Reason  . calcium carbonate (OS-CAL - DOSED IN MG OF ELEMENTAL CALCIUM) 1250 (500 Ca) MG tablet Patient Preference  . calcium gluconate 500 MG tablet Patient Preference  . Cholecalciferol (VITAMIN D PO) Patient Preference  . VYVANSE 70 MG capsule Patient Preference  . Ascorbic Acid (VITAMIN C PO) Patient Preference    Recommendations:  No orders of the defined types were placed in this encounter.   Felicia Franklin  is a 74 y.o. Caucasian female with history of hypertension and hyperlipidemia and asymptomatic paroxysmal atrial fibrillation and moderate obesity with obstructive sleep apnea and unable to tolerate CPAP.She is on Eliquis and doing well  without signs/symptoms of bleeding. Patient presents to me for annual follow-up and evaluation of paroxysmal atrial fibrillation.   Patient is here on annual visit, presently doing well, has not had any recurrence of atrial fibrillation, tolerating anticoagulation.  She states that all her labs including CBC and BMP have been normal.  I do not have a recent labs.  Continues to have chronic back pain and now has injured her right thigh, but has started to exercise again and states that she does water aerobics almost every day. Blood Pressure is well controlled, she maintains sinus rhythm.  No changes in the medications were done today.  I will see her back in a year or sooner if problems. (Patient will mail her labs)   Felicia Prows, MD, [facility] 09/17/2020, 2:38 PM Office: [phone] Pager: [phone]

## 2020-09-24 ENCOUNTER — Other Ambulatory Visit: Payer: Self-pay | Admitting: Cardiology

## 2020-10-08 ENCOUNTER — Ambulatory Visit (INDEPENDENT_AMBULATORY_CARE_PROVIDER_SITE_OTHER): Payer: Medicare PPO | Admitting: Family Medicine

## 2020-10-08 ENCOUNTER — Encounter (INDEPENDENT_AMBULATORY_CARE_PROVIDER_SITE_OTHER): Payer: Self-pay | Admitting: Family Medicine

## 2020-10-08 ENCOUNTER — Other Ambulatory Visit: Payer: Self-pay

## 2020-10-08 VITALS — BP 127/72 | HR 69 | Temp 97.9°F | Ht 62.0 in | Wt 198.0 lb

## 2020-10-08 DIAGNOSIS — I10 Essential (primary) hypertension: Secondary | ICD-10-CM | POA: Diagnosis not present

## 2020-10-08 DIAGNOSIS — E785 Hyperlipidemia, unspecified: Secondary | ICD-10-CM | POA: Diagnosis not present

## 2020-10-08 DIAGNOSIS — R5383 Other fatigue: Secondary | ICD-10-CM | POA: Diagnosis not present

## 2020-10-08 DIAGNOSIS — Z1331 Encounter for screening for depression: Secondary | ICD-10-CM

## 2020-10-08 DIAGNOSIS — Z6836 Body mass index (BMI) 36.0-36.9, adult: Secondary | ICD-10-CM

## 2020-10-08 DIAGNOSIS — Z0289 Encounter for other administrative examinations: Secondary | ICD-10-CM

## 2020-10-08 DIAGNOSIS — R0602 Shortness of breath: Secondary | ICD-10-CM

## 2020-10-08 DIAGNOSIS — E162 Hypoglycemia, unspecified: Secondary | ICD-10-CM | POA: Diagnosis not present

## 2020-10-08 DIAGNOSIS — E559 Vitamin D deficiency, unspecified: Secondary | ICD-10-CM | POA: Diagnosis not present

## 2020-10-09 LAB — LIPID PANEL WITH LDL/HDL RATIO
Cholesterol, Total: 202 mg/dL — ABNORMAL HIGH (ref 100–199)
HDL: 61 mg/dL (ref >39)
LDL Chol Calc (NIH): 118 mg/dL — ABNORMAL HIGH (ref 0–99)
LDL/HDL Ratio: 1.9 ratio (ref 0.0–3.2)
Triglycerides: 128 mg/dL (ref 0–149)
VLDL Cholesterol Cal: 23 mg/dL (ref 5–40)

## 2020-10-09 LAB — CBC WITH DIFFERENTIAL/PLATELET
Basophils Absolute: 0.1 10*3/uL (ref 0.0–0.2)
Basos: 1 %
EOS (ABSOLUTE): 0.2 10*3/uL (ref 0.0–0.4)
Eos: 3 %
Hematocrit: 42.4 % (ref 34.0–46.6)
Hemoglobin: 14 g/dL (ref 11.1–15.9)
Immature Grans (Abs): 0 10*3/uL (ref 0.0–0.1)
Immature Granulocytes: 0 %
Lymphocytes Absolute: 1.4 10*3/uL (ref 0.7–3.1)
Lymphs: 26 %
MCH: 29.9 pg (ref 26.6–33.0)
MCHC: 33 g/dL (ref 31.5–35.7)
MCV: 91 fL (ref 79–97)
Monocytes Absolute: 0.6 10*3/uL (ref 0.1–0.9)
Monocytes: 12 %
Neutrophils Absolute: 3.1 10*3/uL (ref 1.4–7.0)
Neutrophils: 58 %
Platelets: 243 10*3/uL (ref 150–450)
RBC: 4.68 x10E6/uL (ref 3.77–5.28)
RDW: 12.9 % (ref 11.7–15.4)
WBC: 5.3 10*3/uL (ref 3.4–10.8)

## 2020-10-09 LAB — COMPREHENSIVE METABOLIC PANEL
ALT: 16 IU/L (ref 0–32)
AST: 19 IU/L (ref 0–40)
Albumin/Globulin Ratio: 1.6 (ref 1.2–2.2)
Albumin: 4.5 g/dL (ref 3.7–4.7)
Alkaline Phosphatase: 99 IU/L (ref 44–121)
BUN/Creatinine Ratio: 20 (ref 12–28)
BUN: 16 mg/dL (ref 8–27)
Bilirubin Total: 0.8 mg/dL (ref 0.0–1.2)
CO2: 23 mmol/L (ref 20–29)
Calcium: 9.8 mg/dL (ref 8.7–10.3)
Chloride: 101 mmol/L (ref 96–106)
Creatinine, Ser: 0.82 mg/dL (ref 0.57–1.00)
GFR calc Af Amer: 82 mL/min/{1.73_m2} (ref >59)
GFR calc non Af Amer: 71 mL/min/{1.73_m2} (ref >59)
Globulin, Total: 2.8 g/dL (ref 1.5–4.5)
Glucose: 98 mg/dL (ref 65–99)
Potassium: 4.8 mmol/L (ref 3.5–5.2)
Sodium: 140 mmol/L (ref 134–144)
Total Protein: 7.3 g/dL (ref 6.0–8.5)

## 2020-10-09 LAB — T4: T4, Total: 6.6 ug/dL (ref 4.5–12.0)

## 2020-10-09 LAB — VITAMIN D 25 HYDROXY (VIT D DEFICIENCY, FRACTURES): Vit D, 25-Hydroxy: 66 ng/mL (ref 30.0–100.0)

## 2020-10-09 LAB — HEMOGLOBIN A1C
Est. average glucose Bld gHb Est-mCnc: 114 mg/dL
Hgb A1c MFr Bld: 5.6 % (ref 4.8–5.6)

## 2020-10-09 LAB — TSH: TSH: 2.27 u[IU]/mL (ref 0.450–4.500)

## 2020-10-09 LAB — T3: T3, Total: 124 ng/dL (ref 71–180)

## 2020-10-09 LAB — INSULIN, RANDOM: INSULIN: 23.3 u[IU]/mL (ref 2.6–24.9)

## 2020-10-15 NOTE — Progress Notes (Signed)
Chief Complaint:   Felicia Franklin Franklin (MR# [id number]) is a 75 y.o. female who presents for evaluation and treatment of Felicia Franklin and related comorbidities. Current BMI is Body mass index is 36.21 kg/m. Felicia Franklin Franklin has been struggling with her weight for many years and has been unsuccessful in either losing weight, maintaining weight loss, or reaching her healthy weight goal.  Felicia Franklin Franklin is currently in the action stage of change and ready to dedicate time achieving and maintaining a healthier weight. Felicia Franklin Franklin is interested in becoming our patient and working on intensive lifestyle modifications including (but not limited to) diet and exercise for weight loss.  Felicia Franklin Franklin's habits were reviewed today and are as follows: Her family eats meals together, she struggles with family and or coworkers weight loss sabotage, her desired weight loss is 28-48 lbs, she has been heavy most of her life, she started gaining weight since infancy, her heaviest weight ever was 224 pounds, she has significant food cravings issues, she skips meals frequently, she frequently makes poor food choices, she has problems with excessive hunger, she frequently eats larger portions than normal and she struggles with emotional eating.  Depression Screen Felicia Franklin Franklin (modified PHQ-9) score was 17.  Depression screen PHQ 2/9 10/08/2020  Decreased Interest 3  Down, Depressed, Hopeless 2  PHQ - 2 Score 5  Altered sleeping 1  Tired, decreased energy 2  Change in appetite 2  Feeling bad or failure about yourself  2  Trouble concentrating 1  Moving slowly or fidgety/restless 2  Suicidal thoughts 1  PHQ-9 Score 16   Subjective:   1. Other fatigue Felicia Franklin Franklin admits to daytime somnolence and admits to waking up still tired. Patent has a history of symptoms of daytime fatigue. Felicia Franklin generally gets 6 or 7 hours of sleep per night, and states that she has generally restful sleep. Snoring is present. Apneic episodes are not present.  Epworth Sleepiness Score is 3.  2. Shortness of breath on exertion Felicia Franklin Franklin notes increasing shortness of breath with exercising and seems to be worsening over time with weight gain. She notes getting out of breath sooner with activity than she used to. This has not gotten worse recently. Felicia Franklin denies shortness of breath at rest or orthopnea.  3. Primary hypertension Felicia Franklin Franklin's blood pressure is well controlled on her medications. She is working on diet and weight loss to improve further.  4. Hyperlipidemia, unspecified hyperlipidemia type Felicia Franklin Franklin is on statin, and she denies chest pain or myalgias. She is working on diet and weight to improve.  5. Vitamin D deficiency Felicia Franklin Franklin has a history of Vit D deficiency previously. She has no recent labs, and she notes fatigue.  6. Hypoglycemia Felicia Franklin Franklin has some low glucose readings with elevated insulin many years ago at [facility]. She is not on medications, and she notes a family history of diabetes mellitus (mother and aunt).  Assessment/Plan:   1. Other fatigue Felicia Franklin Franklin does feel that her weight is causing her energy to be lower than it should be. Fatigue may be related to Felicia Franklin, depression or many other causes. Labs will be ordered, and in the meanwhile, Felicia Franklin will focus on self care including making healthy food choices, increasing physical activity and focusing on stress reduction.  - CBC with Differential/Platelet - T3 - T4 - TSH  2. Shortness of breath on exertion Felicia Franklin Franklin does feel that she gets out of breath more easily that she used to when she exercises. Felicia Franklin Franklin's shortness of breath appears to  be Felicia Franklin related and exercise induced. She has agreed to work on weight loss and gradually increase exercise to treat her exercise induced shortness of breath. Will continue to monitor closely.  3. Primary hypertension Felicia Franklin Franklin is working on healthy weight loss and exercise to improve blood pressure control. We will watch for signs of hypotension as she  continues her lifestyle modifications. We will check labs today.  - Comprehensive metabolic panel  4. Hyperlipidemia, unspecified hyperlipidemia type Cardiovascular risk and specific lipid/LDL goals reviewed. We discussed several lifestyle modifications today. We will check labs today.  Felicia Franklin Franklin will continue to work on diet, exercise and weight loss efforts. Orders and follow up as documented in patient record.   - Lipid Panel With LDL/HDL Ratio  5. Vitamin D deficiency Low Vitamin D level contributes to fatigue and are associated with Felicia Franklin, breast, and colon cancer. We will check labs today. Felicia Franklin Franklin will follow-up for routine testing of Vitamin D, at least 2-3 times per year to avoid over-replacement.  - VITAMIN D 25 Hydroxy (Vit-D Deficiency, Fractures)  6. Hypoglycemia Fasting labs will be obtained today and results with be discussed with Felicia Franklin Franklin in 2 weeks at her follow up visit. In the meanwhile Felicia Franklin Franklin will start her Category 2 plan and will work on weight loss efforts.  - Hemoglobin A1c - Insulin, random  7. Screening for depression Felicia Franklin Franklin had a positive depression screening. Depression is commonly associated with Felicia Franklin and often results in emotional eating behaviors. We will monitor this closely and work on CBT to help improve the non-hunger eating patterns. Referral to Psychology may be required if no improvement is seen as she continues in our clinic.  8. 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 I recommend Felicia Franklin Franklin begin the structured treatment plan as follows:  She has agreed to the Category 2 Plan.  Exercise goals: No exercise has been prescribed for now, while we concentrate on nutritional changes.  Behavioral modification strategies: no skipping meals and meal planning and cooking  strategies.  She was informed of the importance of frequent follow-up visits to maximize her success with intensive lifestyle modifications for her multiple health conditions. She was informed we would discuss her lab results at her next visit unless there is a critical issue that needs to be addressed sooner. Felicia Franklin Franklin agreed to keep her next visit at the agreed upon time to discuss these results.  Objective:   Blood pressure 127/72, pulse 69, temperature 97.9 F (36.6 C), height 5\' 2"  (1.575 m), weight 198 lb (89.8 kg), last menstrual period 10/03/1998, SpO2 97 %. Body mass index is 36.21 kg/m.  EKG: Normal sinus rhythm, rate 59 BPM.  Indirect Calorimeter completed today shows a VO2 of 206 and a REE of 1433.  Her calculated basal metabolic rate is 12/02/1998 thus her basal metabolic rate is better than expected.  General: Cooperative, alert, well developed, in no acute distress. HEENT: Conjunctivae and lids unremarkable. Cardiovascular: Regular rhythm.  Lungs: Normal work of breathing. Neurologic: No focal deficits.   Lab Results  Component Value Date   CREATININE 0.82 10/08/2020   BUN 16 10/08/2020   NA 140 10/08/2020   K 4.8 10/08/2020   CL 101 10/08/2020   CO2 23 10/08/2020   Lab Results  Component Value Date   ALT 16 10/08/2020  AST 19 10/08/2020   ALKPHOS 99 10/08/2020   BILITOT 0.8 10/08/2020   Lab Results  Component Value Date   HGBA1C 5.6 10/08/2020   Lab Results  Component Value Date   INSULIN 23.3 10/08/2020   Lab Results  Component Value Date   TSH 2.270 10/08/2020   Lab Results  Component Value Date   CHOL 202 (H) 10/08/2020   HDL 61 10/08/2020   LDLCALC 118 (H) 10/08/2020   TRIG 128 10/08/2020   Lab Results  Component Value Date   WBC 5.3 10/08/2020   HGB 14.0 10/08/2020   HCT 42.4 10/08/2020   MCV 91 10/08/2020   PLT 243 10/08/2020   No results found for: IRON, TIBC, FERRITIN Felicia Franklin Behavioral Intervention:   Approximately 15 minutes were  spent on the discussion below.  ASK: We discussed the diagnosis of Felicia Franklin with Felicia Franklin Franklin today and Felicia Franklin Franklin agreed to give [place] permission to discuss Felicia Franklin behavioral modification therapy today.  ASSESS: Trinidi has the diagnosis of Felicia Franklin and her BMI today is 36.21. Felicia Franklin Franklin is in the action stage of change.   ADVISE: Micala was educated on the multiple health risks of Felicia Franklin as well as the benefit of weight loss to improve her health. She was advised of the need for long term treatment and the importance of lifestyle modifications to improve her current health and to decrease her risk of future health problems.  AGREE: Multiple dietary modification options and treatment options were discussed and Emylia agreed to follow the recommendations documented in the above note.  ARRANGE: Jozie was educated on the importance of frequent visits to treat Felicia Franklin as outlined per CMS and USPSTF guidelines and agreed to schedule her next follow up appointment today.  Attestation Statements:   Reviewed by clinician on day of visit: allergies, medications, problem list, medical history, surgical history, family history, social history, and previous encounter notes.   I, Burt Knack, am acting as transcriptionist for Quillian Quince, MD.  I have reviewed the above documentation for accuracy and completeness, and I agree with the above. - Quillian Quince, MD

## 2020-10-22 ENCOUNTER — Other Ambulatory Visit: Payer: Self-pay

## 2020-10-22 ENCOUNTER — Ambulatory Visit (INDEPENDENT_AMBULATORY_CARE_PROVIDER_SITE_OTHER): Payer: Medicare PPO | Admitting: Family Medicine

## 2020-10-22 ENCOUNTER — Encounter (INDEPENDENT_AMBULATORY_CARE_PROVIDER_SITE_OTHER): Payer: Self-pay | Admitting: Family Medicine

## 2020-10-22 VITALS — BP 122/65 | HR 76 | Temp 98.0°F | Ht 62.0 in | Wt 193.0 lb

## 2020-10-22 DIAGNOSIS — E7849 Other hyperlipidemia: Secondary | ICD-10-CM | POA: Diagnosis not present

## 2020-10-22 DIAGNOSIS — Z6835 Body mass index (BMI) 35.0-35.9, adult: Secondary | ICD-10-CM

## 2020-10-22 DIAGNOSIS — E559 Vitamin D deficiency, unspecified: Secondary | ICD-10-CM

## 2020-10-22 DIAGNOSIS — R7303 Prediabetes: Secondary | ICD-10-CM

## 2020-10-26 NOTE — Progress Notes (Signed)
Chief Complaint:   OBESITY Felicia Franklin is here to discuss her progress with her obesity treatment plan along with follow-up of her obesity related diagnoses. Felicia Franklin is on the Category 2 Plan and states she is following her eating plan approximately 85% of the time. Felicia Franklin states she is doing water aerobics for 45-60 minutes 10 times for 5 days.  Today's visit was #: 2 Starting weight: 198 lbs Starting date: 10/08/2020 Today's weight: 193 lbs Today's date: 10/22/2020 Total lbs lost to date: 5 Total lbs lost since last in-office visit: 5  Interim History: Felicia Franklin has done very well with weight loss on her Category 2 plan. He r hunger was mostly controlled and she sometimes struggles to eat all of the food on her plan, especially for dinner.  Subjective:   1. Pre-diabetes Felicia Franklin has a new diagnosis of pre-diabetes. Her fasting glucose was within normal limits, and her A1c is slightly above ideal but fasting insulin is well controlled. She notes decreased polyphagia on her eating plan.  2. Vitamin D deficiency Felicia Franklin is on Vit D OTC, and her level is not at goal. She denies nausea, vomiting, or muscle weakness.   3. Other hyperlipidemia Felicia Franklin's LDL is above goal on Zocor 40 mg. She denies chest pain or myalgias.  Assessment/Plan:   1. Pre-diabetes Felicia Franklin will continue to work on weight loss, exercise, and decreasing simple carbohydrates to help decrease the risk of diabetes. We will recheck labs in 3 months.  2. Vitamin D deficiency Low Vitamin D level contributes to fatigue and are associated with obesity, breast, and colon cancer. Felicia Franklin agreed to continue taking OTC Vitamin D and we will continue to monitor, as Vitamin D can increase with weight loss and she may become over-replaced.  3. Other hyperlipidemia Cardiovascular risk and specific lipid/LDL goals reviewed. We discussed several lifestyle modifications today. Felicia Franklin will continue to work on diet, exercise and weight loss efforts. We  will recheck labs in 3 months. Orders and follow up as documented in patient record.   4. Class 2 severe obesity with serious comorbidity and body mass index (BMI) of 35.0 to 35.9 in adult, unspecified obesity type (HCC) Felicia Franklin is currently in the action stage of change. As such, her goal is to continue with weight loss efforts. She has agreed to the Category 2 Plan + lean meat equivalents.   Exercise goals: As is.  Behavioral modification strategies: increasing lean protein intake and decreasing simple carbohydrates.  Felicia Franklin has agreed to follow-up with our clinic in 2 weeks. She was informed of the importance of frequent follow-up visits to maximize her success with intensive lifestyle modifications for her multiple health conditions.   Objective:   Blood pressure 122/65, pulse 76, temperature 98 F (36.7 C), height 5\' 2"  (1.575 m), weight 193 lb (87.5 kg), last menstrual period 10/03/1998, SpO2 99 %. Body mass index is 35.3 kg/m.  General: Cooperative, alert, well developed, in no acute distress. HEENT: Conjunctivae and lids unremarkable. Cardiovascular: Regular rhythm.  Lungs: Normal work of breathing. Neurologic: No focal deficits.   Lab Results  Component Value Date   CREATININE 0.82 10/08/2020   BUN 16 10/08/2020   NA 140 10/08/2020   K 4.8 10/08/2020   CL 101 10/08/2020   CO2 23 10/08/2020   Lab Results  Component Value Date   ALT 16 10/08/2020   AST 19 10/08/2020   ALKPHOS 99 10/08/2020   BILITOT 0.8 10/08/2020   Lab Results  Component Value Date  HGBA1C 5.6 10/08/2020   Lab Results  Component Value Date   INSULIN 23.3 10/08/2020   Lab Results  Component Value Date   TSH 2.270 10/08/2020   Lab Results  Component Value Date   CHOL 202 (H) 10/08/2020   HDL 61 10/08/2020   LDLCALC 118 (H) 10/08/2020   TRIG 128 10/08/2020   Lab Results  Component Value Date   WBC 5.3 10/08/2020   HGB 14.0 10/08/2020   HCT 42.4 10/08/2020   MCV 91 10/08/2020   PLT  243 10/08/2020   No results found for: IRON, TIBC, FERRITIN  Attestation Statements:   Reviewed by clinician on day of visit: allergies, medications, problem list, medical history, surgical history, family history, social history, and previous encounter notes.  Time spent on visit including pre-visit chart review and post-visit care and charting was 45 minutes.    I, Burt Knack, am acting as transcriptionist for Quillian Quince, MD.  I have reviewed the above documentation for accuracy and completeness, and I agree with the above. -  Quillian Quince, MD

## 2020-11-05 ENCOUNTER — Encounter (INDEPENDENT_AMBULATORY_CARE_PROVIDER_SITE_OTHER): Payer: Self-pay | Admitting: Family Medicine

## 2020-11-05 ENCOUNTER — Other Ambulatory Visit: Payer: Self-pay

## 2020-11-05 ENCOUNTER — Ambulatory Visit (INDEPENDENT_AMBULATORY_CARE_PROVIDER_SITE_OTHER): Payer: Medicare PPO | Admitting: Family Medicine

## 2020-11-05 VITALS — BP 113/67 | HR 66 | Temp 98.0°F | Ht 62.0 in | Wt 191.0 lb

## 2020-11-05 DIAGNOSIS — R7303 Prediabetes: Secondary | ICD-10-CM | POA: Diagnosis not present

## 2020-11-05 DIAGNOSIS — Z6835 Body mass index (BMI) 35.0-35.9, adult: Secondary | ICD-10-CM | POA: Diagnosis not present

## 2020-11-09 ENCOUNTER — Encounter (INDEPENDENT_AMBULATORY_CARE_PROVIDER_SITE_OTHER): Payer: Self-pay | Admitting: Family Medicine

## 2020-11-09 DIAGNOSIS — R7303 Prediabetes: Secondary | ICD-10-CM | POA: Insufficient documentation

## 2020-11-09 NOTE — Progress Notes (Signed)
Chief Complaint:   OBESITY Felicia Franklin is here to discuss her progress with her obesity treatment plan along with follow-up of her obesity related diagnoses. Felicia Franklin is on the Category 2 Plan + lean meat equivalents and states she is following her eating plan approximately 65% of the time. Felicia Franklin states she is doing water classes for 1 hour and 45 minutes 5 times per week.  Today's visit was #: 3 Starting weight: 10/08/2020 Starting date: 198 lbs Today's weight: 191 lbs Today's date: 11/05/2020 Total lbs lost to date: 7 Total lbs lost since last in-office visit: 2  Interim History: Annaclaire has found the plan to be challenging the past few weeks. She misses foods that she loves, such as sweets, pasta, etc. She does a great job with water exercise 5 x per week. She anticipates several social events coming up.  She had been on fen-phen in the past which worked very well. She has also been on Contrave which did not help her with appetite or cravings.  Subjective:   1. Pre-diabetes Faithann's last A1c was 5.6, and her fasting insulin was 23.3. She notes polyphagia and cravings.   Lab Results  Component Value Date   HGBA1C 5.6 10/08/2020   Lab Results  Component Value Date   INSULIN 23.3 10/08/2020   Assessment/Plan:   1. Pre-diabetes  We discussed and I explained insulin resistance in depth, and we discussed Ozempic.  2. Class 2 severe obesity with serious comorbidity and body mass index (BMI) of 35.0 to 35.9 in adult, unspecified obesity type (HCC) Chari is currently in the action stage of change. As such, her goal is to continue with weight loss efforts. She has agreed to the Category 2 Plan.   Exercise goals: As is.  Behavioral modification strategies: better snacking choices.  Jaynee has agreed to follow-up with our clinic in 2 weeks with Dr. Dalbert Garnet, or myself.   Objective:   Blood pressure 113/67, pulse 66, temperature 98 F (36.7 C), height 5\' 2"  (1.575 m), weight 191 lb (86.6  kg), last menstrual period 10/03/1998, SpO2 97 %. Body mass index is 34.93 kg/m.  General: Cooperative, alert, well developed, in no acute distress. HEENT: Conjunctivae and lids unremarkable. Cardiovascular: Regular rhythm.  Lungs: Normal work of breathing. Neurologic: No focal deficits.   Lab Results  Component Value Date   CREATININE 0.82 10/08/2020   BUN 16 10/08/2020   NA 140 10/08/2020   K 4.8 10/08/2020   CL 101 10/08/2020   CO2 23 10/08/2020   Lab Results  Component Value Date   ALT 16 10/08/2020   AST 19 10/08/2020   ALKPHOS 99 10/08/2020   BILITOT 0.8 10/08/2020   Lab Results  Component Value Date   HGBA1C 5.6 10/08/2020   Lab Results  Component Value Date   INSULIN 23.3 10/08/2020   Lab Results  Component Value Date   TSH 2.270 10/08/2020   Lab Results  Component Value Date   CHOL 202 (H) 10/08/2020   HDL 61 10/08/2020   LDLCALC 118 (H) 10/08/2020   TRIG 128 10/08/2020   Lab Results  Component Value Date   WBC 5.3 10/08/2020   HGB 14.0 10/08/2020   HCT 42.4 10/08/2020   MCV 91 10/08/2020   PLT 243 10/08/2020   No results found for: IRON, TIBC, FERRITIN  Obesity Behavioral Intervention:   Approximately 15 minutes were spent on the discussion below.  ASK: We discussed the diagnosis of obesity with Kelli today and Ayanna agreed  to give [place] permission to discuss obesity behavioral modification therapy today.  ASSESS: Lilyth has the diagnosis of obesity and her BMI today is 34.93. Inara is in the action stage of change.   ADVISE: Joshlyn was educated on the multiple health risks of obesity as well as the benefit of weight loss to improve her health. She was advised of the need for long term treatment and the importance of lifestyle modifications to improve her current health and to decrease her risk of future health problems.  AGREE: Multiple dietary modification options and treatment options were discussed and Brigette agreed to follow the  recommendations documented in the above note.  ARRANGE: Scottlyn was educated on the importance of frequent visits to treat obesity as outlined per CMS and USPSTF guidelines and agreed to schedule her next follow up appointment today.  Attestation Statements:   Reviewed by clinician on day of visit: allergies, medications, problem list, medical history, surgical history, family history, social history, and previous encounter notes.   Trude Mcburney, am acting as Energy manager for [place], FNP-C.  I have reviewed the above documentation for accuracy and completeness, and I agree with the above. -  Jesse Sans, FNP

## 2020-11-17 ENCOUNTER — Other Ambulatory Visit: Payer: Self-pay

## 2020-11-17 ENCOUNTER — Ambulatory Visit (INDEPENDENT_AMBULATORY_CARE_PROVIDER_SITE_OTHER): Payer: Medicare PPO | Admitting: Family Medicine

## 2020-11-17 ENCOUNTER — Encounter (INDEPENDENT_AMBULATORY_CARE_PROVIDER_SITE_OTHER): Payer: Self-pay | Admitting: Family Medicine

## 2020-11-17 VITALS — BP 122/69 | HR 67 | Temp 97.9°F | Ht 62.0 in | Wt 192.0 lb

## 2020-11-17 DIAGNOSIS — I10 Essential (primary) hypertension: Secondary | ICD-10-CM

## 2020-11-17 DIAGNOSIS — Z6835 Body mass index (BMI) 35.0-35.9, adult: Secondary | ICD-10-CM

## 2020-11-18 NOTE — Progress Notes (Signed)
Chief Complaint:   OBESITY Felicia Franklin is here to discuss her progress with her obesity treatment plan along with follow-up of her obesity related diagnoses. Felicia Franklin is on the Category 2 Plan and states she is following her eating plan approximately 65% of the time. Felicia Franklin states she is doing water class for 1 hour and 45 minutes 4 times per week.  Today's visit was #: 4 Starting weight: 198 lbs Starting date: 10/08/2020 Today's weight: 192 lbs Today's date: 11/17/2020 Total lbs lost to date: 6 Total lbs lost since last in-office visit: 0  Interim History: Felicia Franklin notes she has a pattern of losing weight and regaining more weight. She has a birthday coming up Saturday, which will include baked goods. She has a history of binge eating behaviors but it does not sounds like she mets the criteria for BED.  She does like the food variety.  She has come up with a system for moving food around during the day for greater options.  Subjective:   1. Primary hypertension Felicia Franklin's blood pressure is well controlled on Norvasc, Aldactone, and Bystolic.  BP Readings from Last 3 Encounters:  11/17/20 122/69  11/05/20 113/67  10/22/20 122/65   Lab Results  Component Value Date   CREATININE 0.82 10/08/2020   CREATININE 0.68 11/21/2013   Assessment/Plan:   1. Primary hypertension Felicia Franklin continue all her medications, and will continue working on healthy weight loss and exercise to improve blood pressure control.   2. Class 2 severe obesity with serious comorbidity and body mass index (BMI) of 35.0 to 35.9 in adult, unspecified obesity type (Felicia Franklin) Felicia Franklin is currently in the action stage of change. As such, her goal is to continue with weight loss efforts. She has agreed to the Category 2 Plan.   We discussed food swaps for variety on the plan. Handout given today: [employer].  Exercise goals: As is.  Behavioral modification strategies: meal planning and cooking strategies and better snacking  choices.  Felicia Franklin has agreed to follow-up with our clinic in 2 weeks.   Objective:   Blood pressure 122/69, pulse 67, temperature 97.9 F (36.6 C), height 5\' 2"  (1.575 m), weight 192 lb (87.1 kg), last menstrual period 10/03/1998, SpO2 98 %. Body mass index is 35.12 kg/m.  General: Cooperative, alert, well developed, in no acute distress. HEENT: Conjunctivae and lids unremarkable. Cardiovascular: Regular rhythm.  Lungs: Normal work of breathing. Neurologic: No focal deficits.   Lab Results  Component Value Date   CREATININE 0.82 10/08/2020   BUN 16 10/08/2020   NA 140 10/08/2020   K 4.8 10/08/2020   CL 101 10/08/2020   CO2 23 10/08/2020   Lab Results  Component Value Date   ALT 16 10/08/2020   AST 19 10/08/2020   ALKPHOS 99 10/08/2020   BILITOT 0.8 10/08/2020   Lab Results  Component Value Date   HGBA1C 5.6 10/08/2020   Lab Results  Component Value Date   INSULIN 23.3 10/08/2020   Lab Results  Component Value Date   TSH 2.270 10/08/2020   Lab Results  Component Value Date   CHOL 202 (H) 10/08/2020   HDL 61 10/08/2020   LDLCALC 118 (H) 10/08/2020   TRIG 128 10/08/2020   Lab Results  Component Value Date   WBC 5.3 10/08/2020   HGB 14.0 10/08/2020   HCT 42.4 10/08/2020   MCV 91 10/08/2020   PLT 243 10/08/2020   No results found for: IRON, TIBC, FERRITIN  Obesity Behavioral Intervention:  Approximately 15 minutes were spent on the discussion below.  ASK: We discussed the diagnosis of obesity with Felicia Franklin today and Felicia Franklin agreed to give [place] permission to discuss obesity behavioral modification therapy today.  ASSESS: Felicia Franklin has the diagnosis of obesity and her BMI today is 35.11. Felicia Franklin is in the action stage of change.   ADVISE: Felicia Franklin was educated on the multiple health risks of obesity as well as the benefit of weight loss to improve her health. She was advised of the need for long term treatment and the importance of lifestyle modifications to improve  her current health and to decrease her risk of future health problems.  AGREE: Multiple dietary modification options and treatment options were discussed and Felicia Franklin agreed to follow the recommendations documented in the above note.  ARRANGE: Felicia Franklin was educated on the importance of frequent visits to treat obesity as outlined per CMS and USPSTF guidelines and agreed to schedule her next follow up appointment today.  Attestation Statements:   Reviewed by clinician on day of visit: allergies, medications, problem list, medical history, surgical history, family history, social history, and previous encounter notes.   Trude Mcburney, am acting as Energy manager for [place], FNP-C.  I have reviewed the above documentation for accuracy and completeness, and I agree with the above. -  Jesse Sans, FNP

## 2020-11-19 ENCOUNTER — Encounter (INDEPENDENT_AMBULATORY_CARE_PROVIDER_SITE_OTHER): Payer: Self-pay | Admitting: Family Medicine

## 2020-12-01 ENCOUNTER — Ambulatory Visit (INDEPENDENT_AMBULATORY_CARE_PROVIDER_SITE_OTHER): Payer: Medicare PPO | Admitting: Family Medicine

## 2020-12-01 ENCOUNTER — Other Ambulatory Visit: Payer: Self-pay

## 2020-12-01 ENCOUNTER — Encounter (INDEPENDENT_AMBULATORY_CARE_PROVIDER_SITE_OTHER): Payer: Self-pay | Admitting: Family Medicine

## 2020-12-01 VITALS — BP 114/73 | HR 70 | Temp 98.0°F | Ht 62.0 in | Wt 193.0 lb

## 2020-12-01 DIAGNOSIS — R7303 Prediabetes: Secondary | ICD-10-CM

## 2020-12-01 DIAGNOSIS — Z6835 Body mass index (BMI) 35.0-35.9, adult: Secondary | ICD-10-CM | POA: Diagnosis not present

## 2020-12-01 DIAGNOSIS — E7849 Other hyperlipidemia: Secondary | ICD-10-CM

## 2020-12-01 MED ORDER — OZEMPIC (0.25 OR 0.5 MG/DOSE) 2 MG/1.5ML ~~LOC~~ SOPN
0.2500 mg | PEN_INJECTOR | SUBCUTANEOUS | 0 refills | Status: DC
Start: 2020-12-01 — End: 2021-01-05

## 2020-12-02 ENCOUNTER — Encounter (INDEPENDENT_AMBULATORY_CARE_PROVIDER_SITE_OTHER): Payer: Self-pay | Admitting: Family Medicine

## 2020-12-02 DIAGNOSIS — E7849 Other hyperlipidemia: Secondary | ICD-10-CM | POA: Insufficient documentation

## 2020-12-02 NOTE — Progress Notes (Signed)
Chief Complaint:   OBESITY Felicia Franklin is here to discuss her progress with her obesity treatment plan along with follow-up of her obesity related diagnoses. Felicia Franklin is on the Category 2 Plan and states she is following her eating plan approximately 50% of the time. Felicia Franklin states she is doing water aerobics for 105 minutes 5 times per week.  Today's visit was #: 5 Starting weight: 198 lbs Starting date: 10/08/2020 Today's weight: 193 lbs Today's date: 12/01/2020 Total lbs lost to date: 5 lbs Total lbs lost since last in-office visit: (+1)  Interim History: Felicia Franklin notes she has had some social occasions which included eating out (3 times).  Notes hunger and cravings.  She has struggled to get back on plan after dining out.  Subjective:   1. Prediabetes Notes some hunger and cravings.  Last A1c 5.6.  Not on metformin.  Discussed metformin and Ozempic.  No history of pancreatitis, cholelithiasis, or thyroid cancer.  Lab Results  Component Value Date   HGBA1C 5.6 10/08/2020   Lab Results  Component Value Date   INSULIN 23.3 10/08/2020   2. Other hyperlipidemia LDL above goal at 118.  TG and HDL within normal limits.  She is on Zocor 40 mg daily.  Lab Results  Component Value Date   ALT 16 10/08/2020   AST 19 10/08/2020   ALKPHOS 99 10/08/2020   BILITOT 0.8 10/08/2020   Lab Results  Component Value Date   CHOL 202 (H) 10/08/2020   HDL 61 10/08/2020   LDLCALC 118 (H) 10/08/2020   TRIG 128 10/08/2020   Assessment/Plan:   1. Prediabetes New prescription for Ozempic 0.25 mg subcutaneously weekly, as per below.  - Start Semaglutide,0.25 or 0.5MG /DOS, (OZEMPIC, 0.25 OR 0.5 MG/DOSE,) 2 MG/1.5ML SOPN; Inject 0.25 mg into the skin once a week.  Dispense: 1.5 mL; Refill: 0  2. Other hyperlipidemia Continue Zocor.  3. Class 2 severe obesity with serious comorbidity and body mass index (BMI) of 35.0 to 35.9 in adult, unspecified obesity type (HCC)  Felicia Franklin is currently in the action  stage of change. As such, her goal is to continue with weight loss efforts. She has agreed to the Category 2 Plan.   Exercise goals: As is.  Behavioral modification strategies: decreasing simple carbohydrates.  Felicia Franklin has agreed to follow-up with our clinic in 2 weeks.   Objective:   Blood pressure 114/73, pulse 70, temperature 98 F (36.7 C), temperature source Oral, height 5\' 2"  (1.575 m), weight 193 lb (87.5 kg), last menstrual period 10/03/1998, SpO2 97 %. Body mass index is 35.3 kg/m.  General: Cooperative, alert, well developed, in no acute distress. HEENT: Conjunctivae and lids unremarkable. Cardiovascular: Regular rhythm.  Lungs: Normal work of breathing. Neurologic: No focal deficits.   Lab Results  Component Value Date   CREATININE 0.82 10/08/2020   BUN 16 10/08/2020   NA 140 10/08/2020   K 4.8 10/08/2020   CL 101 10/08/2020   CO2 23 10/08/2020   Lab Results  Component Value Date   ALT 16 10/08/2020   AST 19 10/08/2020   ALKPHOS 99 10/08/2020   BILITOT 0.8 10/08/2020   Lab Results  Component Value Date   HGBA1C 5.6 10/08/2020   Lab Results  Component Value Date   INSULIN 23.3 10/08/2020   Lab Results  Component Value Date   TSH 2.270 10/08/2020   Lab Results  Component Value Date   CHOL 202 (H) 10/08/2020   HDL 61 10/08/2020   LDLCALC 118 (  H) 10/08/2020   TRIG 128 10/08/2020   Lab Results  Component Value Date   WBC 5.3 10/08/2020   HGB 14.0 10/08/2020   HCT 42.4 10/08/2020   MCV 91 10/08/2020   PLT 243 10/08/2020   Obesity Behavioral Intervention:   Approximately 15 minutes were spent on the discussion below.  ASK: We discussed the diagnosis of obesity with Felicia Franklin today and Felicia Franklin agreed to give [place] permission to discuss obesity behavioral modification therapy today.  ASSESS: Felicia Franklin has the diagnosis of obesity and her BMI today is 35.3. Felicia Franklin is in the action stage of change.   ADVISE: Felicia Franklin was educated on the multiple health risks of  obesity as well as the benefit of weight loss to improve her health. She was advised of the need for long term treatment and the importance of lifestyle modifications to improve her current health and to decrease her risk of future health problems.  AGREE: Multiple dietary modification options and treatment options were discussed and Felicia Franklin agreed to follow the recommendations documented in the above note.  ARRANGE: Felicia Franklin was educated on the importance of frequent visits to treat obesity as outlined per CMS and USPSTF guidelines and agreed to schedule her next follow up appointment today.  Attestation Statements:   Reviewed by clinician on day of visit: allergies, medications, problem list, medical history, surgical history, family history, social history, and previous encounter notes.  Franklin, Insurance claims handler, CMA, am acting as Energy manager for [place], FNP.  Franklin have reviewed the above documentation for accuracy and completeness, and Franklin agree with the above. -  Felicia Sans, FNP

## 2020-12-21 ENCOUNTER — Other Ambulatory Visit: Payer: Self-pay

## 2020-12-21 ENCOUNTER — Ambulatory Visit (INDEPENDENT_AMBULATORY_CARE_PROVIDER_SITE_OTHER): Payer: Medicare PPO | Admitting: Family Medicine

## 2020-12-21 ENCOUNTER — Encounter (INDEPENDENT_AMBULATORY_CARE_PROVIDER_SITE_OTHER): Payer: Self-pay | Admitting: Family Medicine

## 2020-12-21 VITALS — BP 127/75 | HR 61 | Temp 98.0°F | Ht 62.0 in | Wt 190.0 lb

## 2020-12-21 DIAGNOSIS — Z6836 Body mass index (BMI) 36.0-36.9, adult: Secondary | ICD-10-CM

## 2020-12-21 DIAGNOSIS — Z9189 Other specified personal risk factors, not elsewhere classified: Secondary | ICD-10-CM

## 2020-12-21 DIAGNOSIS — R7303 Prediabetes: Secondary | ICD-10-CM

## 2020-12-22 NOTE — Progress Notes (Signed)
Chief Complaint:   OBESITY Brooklynn is here to discuss her progress with her obesity treatment plan along with follow-up of her obesity related diagnoses. Glennice is on the Category 2 Plan and states she is following her eating plan approximately 60% of the time. Jaimy states she is doing water classes and walking for 105 minutes 5 times per week.  Today's visit was #: 6 Starting weight: 198 lbs Starting date: 10/08/2020 Today's weight: 190 lbs Today's date: 12/21/2020 Total lbs lost to date: 8 Total lbs lost since last in-office visit: 3  Interim History: Ashtynn started Ozempic 2 weeks ago and she is down 3 lbs. She feels it could be due to the Ozempic. She admits to being off the plan due to her birthday. She is trying to reduce carbohydrates at lunch. She eats out often at lunch because she dines out with friends.  Subjective:   1. Pre-diabetes Moncia started Ozempic 2 weeks ago. She feels perhaps it has helped with her appetite in a subtle way. She notes nausea and constipation, and she takes miralax.   Lab Results  Component Value Date   HGBA1C 5.6 10/08/2020   Lab Results  Component Value Date   INSULIN 23.3 10/08/2020   Assessment/Plan:   1. Pre-diabetes Imunique will increase Ozempic to 0.50 mg.  2. Class 2 severe obesity with serious comorbidity and body mass index (BMI) of 36.0 to 36.9 in adult, unspecified obesity type (HCC) Jasmene is currently in the action stage of change. As such, her goal is to continue with weight loss efforts. She has agreed to the Category 2 Plan and keeping a food journal and adhering to recommended goals of 400-600 calories and 35 grams of protein at supper daily.   Exercise goals: As is.  Behavioral modification strategies: meal planning and cooking strategies.  Daleyssa has agreed to follow-up with our clinic in 2 weeks.  Objective:   Blood pressure 127/75, pulse 61, temperature 98 F (36.7 C), height 5\' 2"  (1.575 m), weight 190 lb (86.2 kg),  last menstrual period 10/03/1998, SpO2 97 %. Body mass index is 34.75 kg/m.  General: Cooperative, alert, well developed, in no acute distress. HEENT: Conjunctivae and lids unremarkable. Cardiovascular: Regular rhythm.  Lungs: Normal work of breathing. Neurologic: No focal deficits.   Lab Results  Component Value Date   CREATININE 0.82 10/08/2020   BUN 16 10/08/2020   NA 140 10/08/2020   K 4.8 10/08/2020   CL 101 10/08/2020   CO2 23 10/08/2020   Lab Results  Component Value Date   ALT 16 10/08/2020   AST 19 10/08/2020   ALKPHOS 99 10/08/2020   BILITOT 0.8 10/08/2020   Lab Results  Component Value Date   HGBA1C 5.6 10/08/2020   Lab Results  Component Value Date   INSULIN 23.3 10/08/2020   Lab Results  Component Value Date   TSH 2.270 10/08/2020   Lab Results  Component Value Date   CHOL 202 (H) 10/08/2020   HDL 61 10/08/2020   LDLCALC 118 (H) 10/08/2020   TRIG 128 10/08/2020   Lab Results  Component Value Date   WBC 5.3 10/08/2020   HGB 14.0 10/08/2020   HCT 42.4 10/08/2020   MCV 91 10/08/2020   PLT 243 10/08/2020   No results found for: IRON, TIBC, FERRITIN  Obesity Behavioral Intervention:   Approximately 15 minutes were spent on the discussion below.  ASK: We discussed the diagnosis of obesity with Kinsly today and Anayansi agreed to  give [place] permission to discuss obesity behavioral modification therapy today.  ASSESS: Imagene has the diagnosis of obesity and her BMI today is 34.74. Devanee is in the action stage of change.   ADVISE: Zandra was educated on the multiple health risks of obesity as well as the benefit of weight loss to improve her health. She was advised of the need for long term treatment and the importance of lifestyle modifications to improve her current health and to decrease her risk of future health problems.  AGREE: Multiple dietary modification options and treatment options were discussed and Kamerin agreed to follow the recommendations  documented in the above note.  ARRANGE: Kalee was educated on the importance of frequent visits to treat obesity as outlined per CMS and USPSTF guidelines and agreed to schedule her next follow up appointment today.  Attestation Statements:   Reviewed by clinician on day of visit: allergies, medications, problem list, medical history, surgical history, family history, social history, and previous encounter notes.   Trude Mcburney, am acting as Energy manager for [place], FNP-C.  I have reviewed the above documentation for accuracy and completeness, and I agree with the above. -  ,mec

## 2020-12-23 ENCOUNTER — Other Ambulatory Visit: Payer: Self-pay | Admitting: Cardiology

## 2020-12-23 ENCOUNTER — Encounter (INDEPENDENT_AMBULATORY_CARE_PROVIDER_SITE_OTHER): Payer: Self-pay | Admitting: Family Medicine

## 2021-01-02 ENCOUNTER — Other Ambulatory Visit (INDEPENDENT_AMBULATORY_CARE_PROVIDER_SITE_OTHER): Payer: Self-pay | Admitting: Family Medicine

## 2021-01-02 DIAGNOSIS — R7303 Prediabetes: Secondary | ICD-10-CM

## 2021-01-05 ENCOUNTER — Ambulatory Visit (INDEPENDENT_AMBULATORY_CARE_PROVIDER_SITE_OTHER): Payer: Medicare PPO | Admitting: Family Medicine

## 2021-01-05 ENCOUNTER — Other Ambulatory Visit: Payer: Self-pay

## 2021-01-05 ENCOUNTER — Encounter (INDEPENDENT_AMBULATORY_CARE_PROVIDER_SITE_OTHER): Payer: Self-pay | Admitting: Family Medicine

## 2021-01-05 VITALS — BP 105/70 | HR 74 | Temp 97.9°F | Ht 62.0 in | Wt 189.0 lb

## 2021-01-05 DIAGNOSIS — M419 Scoliosis, unspecified: Secondary | ICD-10-CM

## 2021-01-05 DIAGNOSIS — Z6836 Body mass index (BMI) 36.0-36.9, adult: Secondary | ICD-10-CM

## 2021-01-05 DIAGNOSIS — R7303 Prediabetes: Secondary | ICD-10-CM | POA: Diagnosis not present

## 2021-01-05 MED ORDER — OZEMPIC (1 MG/DOSE) 2 MG/1.5ML ~~LOC~~ SOPN: 1.0000 mg | PEN_INJECTOR | SUBCUTANEOUS | 0 refills | Status: DC

## 2021-01-07 NOTE — Progress Notes (Signed)
Chief Complaint:   OBESITY Felicia Franklin is here to discuss her progress with her obesity treatment plan along with follow-up of her obesity related diagnoses. Felicia Franklin is on the Category 2 Plan and keeping a food journal and adhering to recommended goals of 400-600 calories and 35 g protein and states she is following her eating plan approximately 65% of the time. Felicia Franklin states she is doing water aerobics 105 minutes 5 times per week.  Today's visit was #: 7 Starting weight: 198 lbs Starting date: 10/08/2020 Today's weight: 189 lbs Today's date: 01/05/2021 Total lbs lost to date: 9 Total lbs lost since last in-office visit: 1  Interim History: Felicia Franklin feels that with eating out socially generally 1-4 times a week is impacting her weight loss. She feels she is doing better when eating at home. She is enjoying the breakfast options.  Subjective:   1. Pre-diabetes Felicia Franklin notes no effect so far from Ozempic as far as appetite suppression at 0.5 mg weekly.  Lab Results  Component Value Date   HGBA1C 5.6 10/08/2020   Lab Results  Component Value Date   INSULIN 23.3 10/08/2020    2. Scoliosis, unspecified scoliosis type, unspecified spinal region Felicia Franklin notes increasing pain in mid to lower back. She was diagnosed with scoliosis in her 65's. She notes increased problems with gait and pain.  Assessment/Plan:   1. Pre-diabetes  Increase dose of Ozempic to 1 mg weekly, as prescribed below.  - Semaglutide, 1 MG/DOSE, (OZEMPIC, 1 MG/DOSE,) 2 MG/1.5ML SOPN; Inject 1 mg into the skin once a week.  Dispense: 1.5 mL; Refill: 0  2. Scoliosis, unspecified scoliosis type, unspecified spinal region I will check information resources for this (physicians).  3. Obesity: BMI 34 Felicia Franklin is currently in the action stage of change. As such, her goal is to continue with weight loss efforts. She has agreed to the Category 2 Plan and keeping a food journal and adhering to recommended goals of 400-600 calories and  35 g protein.   Discussed options for salad dressing.  Exercise goals: As is  Behavioral modification strategies: planning for success.  Felicia Franklin has agreed to follow-up with our clinic in 3 weeks.   Objective:   Blood pressure 105/70, pulse 74, temperature 97.9 F (36.6 C), height 5\' 2"  (1.575 m), weight 189 lb (85.7 kg), last menstrual period 10/03/1998, SpO2 96 %. Body mass index is 34.57 kg/m.  General: Cooperative, alert, well developed, in no acute distress. HEENT: Conjunctivae and lids unremarkable. Cardiovascular: Regular rhythm.  Lungs: Normal work of breathing. Neurologic: No focal deficits.   Lab Results  Component Value Date   CREATININE 0.82 10/08/2020   BUN 16 10/08/2020   NA 140 10/08/2020   K 4.8 10/08/2020   CL 101 10/08/2020   CO2 23 10/08/2020   Lab Results  Component Value Date   ALT 16 10/08/2020   AST 19 10/08/2020   ALKPHOS 99 10/08/2020   BILITOT 0.8 10/08/2020   Lab Results  Component Value Date   HGBA1C 5.6 10/08/2020   Lab Results  Component Value Date   INSULIN 23.3 10/08/2020   Lab Results  Component Value Date   TSH 2.270 10/08/2020   Lab Results  Component Value Date   CHOL 202 (H) 10/08/2020   HDL 61 10/08/2020   LDLCALC 118 (H) 10/08/2020   TRIG 128 10/08/2020   Lab Results  Component Value Date   WBC 5.3 10/08/2020   HGB 14.0 10/08/2020   HCT 42.4 10/08/2020  MCV 91 10/08/2020   PLT 243 10/08/2020   No results found for: IRON, TIBC, FERRITIN  Obesity Behavioral Intervention:   Approximately 15 minutes were spent on the discussion below.  ASK: We discussed the diagnosis of obesity with Felicia Franklin today and Felicia Franklin agreed to give [place] permission to discuss obesity behavioral modification therapy today.  ASSESS: Felicia Franklin has the diagnosis of obesity and her BMI today is 34.7. Felicia Franklin is in the action stage of change.   ADVISE: Felicia Franklin was educated on the multiple health risks of obesity as well as the benefit of weight loss  to improve her health. She was advised of the need for long term treatment and the importance of lifestyle modifications to improve her current health and to decrease her risk of future health problems.  AGREE: Multiple dietary modification options and treatment options were discussed and Felicia Franklin agreed to follow the recommendations documented in the above note.  ARRANGE: Felicia Franklin was educated on the importance of frequent visits to treat obesity as outlined per CMS and USPSTF guidelines and agreed to schedule her next follow up appointment today.  Attestation Statements:   Reviewed by clinician on day of visit: allergies, medications, problem list, medical history, surgical history, family history, social history, and previous encounter notes.  Edmund Hilda, am acting as Energy manager for [place], FNP.  I have reviewed the above documentation for accuracy and completeness, and I agree with the above. -  Jesse Sans, FNP

## 2021-01-11 ENCOUNTER — Encounter (INDEPENDENT_AMBULATORY_CARE_PROVIDER_SITE_OTHER): Payer: Self-pay | Admitting: Family Medicine

## 2021-01-18 ENCOUNTER — Encounter (INDEPENDENT_AMBULATORY_CARE_PROVIDER_SITE_OTHER): Payer: Self-pay | Admitting: Family Medicine

## 2021-01-19 DIAGNOSIS — M542 Cervicalgia: Secondary | ICD-10-CM | POA: Diagnosis not present

## 2021-01-26 DIAGNOSIS — M5030 Other cervical disc degeneration, unspecified cervical region: Secondary | ICD-10-CM | POA: Diagnosis not present

## 2021-02-01 ENCOUNTER — Ambulatory Visit (INDEPENDENT_AMBULATORY_CARE_PROVIDER_SITE_OTHER): Payer: Medicare PPO | Admitting: Family Medicine

## 2021-02-01 ENCOUNTER — Other Ambulatory Visit: Payer: Self-pay

## 2021-02-01 ENCOUNTER — Encounter (INDEPENDENT_AMBULATORY_CARE_PROVIDER_SITE_OTHER): Payer: Self-pay | Admitting: Family Medicine

## 2021-02-01 VITALS — BP 121/72 | HR 72 | Temp 97.9°F | Ht 62.0 in | Wt 187.0 lb

## 2021-02-01 DIAGNOSIS — R7303 Prediabetes: Secondary | ICD-10-CM

## 2021-02-01 DIAGNOSIS — I10 Essential (primary) hypertension: Secondary | ICD-10-CM

## 2021-02-01 DIAGNOSIS — Z6836 Body mass index (BMI) 36.0-36.9, adult: Secondary | ICD-10-CM

## 2021-02-01 MED ORDER — OZEMPIC (1 MG/DOSE) 2 MG/1.5ML ~~LOC~~ SOPN
1.0000 mg | PEN_INJECTOR | SUBCUTANEOUS | 0 refills | Status: DC
Start: 2021-02-01 — End: 2021-02-24

## 2021-02-02 NOTE — Progress Notes (Signed)
Chief Complaint:   OBESITY Felicia Franklin is here to discuss her progress with her obesity treatment plan along with follow-up of her obesity related diagnoses. Felicia Franklin is on the Category 2 Plan and keeping a food journal and adhering to recommended goals of 400-600 calories and 35+ grams of protein at supper daily and states she is following her eating plan approximately 45% of the time. Felicia Franklin states she is doing water aerobics class for 1 hour and 45 minutes 5 times per week.  Today's visit was #: 8 Starting weight: 198 lbs Starting date: 10/08/2020 Today's weight: 187 lbs Today's date: 02/01/2021 Total lbs lost to date: 11 Total lbs lost since last in-office visit: 2  Interim History: Felicia Franklin continues to well with weight loss on her eating plan. She has been sleeping poorly due to a strained neck and she started on prednisone, which increased cravings. Despite all of this she was still able to lose 2 lbs.  Subjective:   1. Pre-diabetes Felicia Franklin is doing well on Ozempic, and she notes decreased polyphagia. She denies nausea or vomiting.  2. Essential hypertension Felicia Franklin's blood pressure is well controlled on her medicine and with weight loss. She denies signs of hypotension.  Assessment/Plan:   1. Pre-diabetes Felicia Franklin will continue to work on weight loss, exercise, and decreasing simple carbohydrates to help decrease the risk of diabetes. We will refill Ozempic for 1 month.  - Semaglutide, 1 MG/DOSE, (OZEMPIC, 1 MG/DOSE,) 2 MG/1.5ML SOPN; Inject 1 mg into the skin once a week.  Dispense: 1.5 mL; Refill: 0  2. Essential hypertension Felicia Franklin will continue with diet and healthy weight loss to improve blood pressure control. We will continue to follow closely as she continues her lifestyle modifications.  3. Obesity with current BMI 34.3 Felicia Franklin is currently in the action stage of change. As such, her goal is to continue with weight loss efforts. She has agreed to the Category 2 Plan and keeping a food  journal and adhering to recommended goals of 400-600 calories and 35+ grams of protein at supper daily.   Exercise goals: As is.  Behavioral modification strategies: meal planning and cooking strategies.  Felicia Franklin has agreed to follow-up with our clinic in 3 weeks. She was informed of the importance of frequent follow-up visits to maximize her success with intensive lifestyle modifications for her multiple health conditions.   Objective:   Blood pressure 121/72, pulse 72, temperature 97.9 F (36.6 C), height 5\' 2"  (1.575 m), weight 187 lb (84.8 kg), last menstrual period 10/03/1998, SpO2 96 %. Body mass index is 34.2 kg/m.  General: Cooperative, alert, well developed, in no acute distress. HEENT: Conjunctivae and lids unremarkable. Cardiovascular: Regular rhythm.  Lungs: Normal work of breathing. Neurologic: No focal deficits.   Lab Results  Component Value Date   CREATININE 0.82 10/08/2020   BUN 16 10/08/2020   NA 140 10/08/2020   K 4.8 10/08/2020   CL 101 10/08/2020   CO2 23 10/08/2020   Lab Results  Component Value Date   ALT 16 10/08/2020   AST 19 10/08/2020   ALKPHOS 99 10/08/2020   BILITOT 0.8 10/08/2020   Lab Results  Component Value Date   HGBA1C 5.6 10/08/2020   Lab Results  Component Value Date   INSULIN 23.3 10/08/2020   Lab Results  Component Value Date   TSH 2.270 10/08/2020   Lab Results  Component Value Date   CHOL 202 (H) 10/08/2020   HDL 61 10/08/2020   LDLCALC 118 (H)  10/08/2020   TRIG 128 10/08/2020   Lab Results  Component Value Date   WBC 5.3 10/08/2020   HGB 14.0 10/08/2020   HCT 42.4 10/08/2020   MCV 91 10/08/2020   PLT 243 10/08/2020   No results found for: IRON, TIBC, FERRITIN  Obesity Behavioral Intervention:   Approximately 15 minutes were spent on the discussion below.  ASK: We discussed the diagnosis of obesity with Felicia Franklin today and Felicia Franklin agreed to give [place] permission to discuss obesity behavioral modification therapy  today.  ASSESS: Felicia Franklin has the diagnosis of obesity and her BMI today is 34.19. Felicia Franklin is in the action stage of change.   ADVISE: Felicia Franklin was educated on the multiple health risks of obesity as well as the benefit of weight loss to improve her health. She was advised of the need for long term treatment and the importance of lifestyle modifications to improve her current health and to decrease her risk of future health problems.  AGREE: Multiple dietary modification options and treatment options were discussed and Felicia Franklin agreed to follow the recommendations documented in the above note.  ARRANGE: Felicia Franklin was educated on the importance of frequent visits to treat obesity as outlined per CMS and USPSTF guidelines and agreed to schedule her next follow up appointment today.  Attestation Statements:   Reviewed by clinician on day of visit: allergies, medications, problem list, medical history, surgical history, family history, social history, and previous encounter notes.   I, Burt Knack, am acting as transcriptionist for Quillian Quince, MD.  I have reviewed the above documentation for accuracy and completeness, and I agree with the above. -  Quillian Quince, MD

## 2021-02-11 DIAGNOSIS — M542 Cervicalgia: Secondary | ICD-10-CM | POA: Diagnosis not present

## 2021-02-22 DIAGNOSIS — M542 Cervicalgia: Secondary | ICD-10-CM | POA: Diagnosis not present

## 2021-02-24 ENCOUNTER — Encounter (INDEPENDENT_AMBULATORY_CARE_PROVIDER_SITE_OTHER): Payer: Self-pay | Admitting: Family Medicine

## 2021-02-24 ENCOUNTER — Other Ambulatory Visit: Payer: Self-pay

## 2021-02-24 ENCOUNTER — Ambulatory Visit (INDEPENDENT_AMBULATORY_CARE_PROVIDER_SITE_OTHER): Payer: Medicare PPO | Admitting: Family Medicine

## 2021-02-24 VITALS — BP 114/68 | HR 66 | Temp 97.9°F | Ht 62.0 in | Wt 188.0 lb

## 2021-02-24 DIAGNOSIS — R400 Somnolence: Secondary | ICD-10-CM

## 2021-02-24 DIAGNOSIS — R7303 Prediabetes: Secondary | ICD-10-CM | POA: Diagnosis not present

## 2021-02-24 DIAGNOSIS — Z6836 Body mass index (BMI) 36.0-36.9, adult: Secondary | ICD-10-CM | POA: Diagnosis not present

## 2021-03-01 ENCOUNTER — Other Ambulatory Visit (INDEPENDENT_AMBULATORY_CARE_PROVIDER_SITE_OTHER): Payer: Self-pay | Admitting: Family Medicine

## 2021-03-01 DIAGNOSIS — R7303 Prediabetes: Secondary | ICD-10-CM

## 2021-03-02 MED ORDER — OZEMPIC (1 MG/DOSE) 2 MG/1.5ML ~~LOC~~ SOPN: 1.0000 mg | PEN_INJECTOR | SUBCUTANEOUS | 0 refills | Status: DC

## 2021-03-02 NOTE — Telephone Encounter (Signed)
This was refilled today

## 2021-03-02 NOTE — Telephone Encounter (Signed)
Patient is requesting a refill of the following medications: Requested Prescriptions   Pending Prescriptions Disp Refills   OZEMPIC, 1 MG/DOSE, 4 MG/3ML SOPN [Pharmacy Med Name: OZEMPIC 1MG  PER DOSE (1X4MG  PEN)] 3 mL     Sig: INJECT 1 MG INTO THE SKIN ONCE WEEKLY     Last office visit: 02/24/21 Date of last refill: 03/02/21 Last refill amount: 1.5 Follow up time period per chart: 3 week Next appt scheduled: 03/17/21

## 2021-03-02 NOTE — Progress Notes (Signed)
Chief Complaint:   OBESITY Felicia Franklin is here to discuss her progress with her obesity treatment plan along with follow-up of her obesity related diagnoses. Felicia Franklin is on the Category 2 Plan and keeping a food journal and adhering to recommended goals of 400-600 calories and 35+ grams of protein at supper daily and states she is following her eating plan approximately 45% of the time. Felicia Franklin states she is doing water aerobics for 145 minutes 10 times per week.  Today's visit was #: 9 Starting weight: 198 lbs Starting date: 10/08/2020 Today's weight: 188 lbs Today's date: 02/24/2021 Total lbs lost to date: 10 Total lbs lost since last in-office visit: 0  Interim History: Felicia Franklin has been distracted from following her eating plan. She has been mindful of her food choices overall, but she is ready to get back to her eating plan more strictly.  Subjective:   1. Pre-diabetes Felicia Franklin is doing well on Ozempic and she feels it is helping her polyphagia. She denies nausea or vomiting.  2. Somnolence, daytime Felicia Franklin notes feeling sleepy earlier in the evenings. She feels this started with Ozempic at the higher dose. It doesn't bother her very much and she doesn't want to discontinue the Ozempic at this time.  Assessment/Plan:   1. Pre-diabetes Felicia Franklin will continue to work on weight loss, exercise, and decreasing simple carbohydrates to help decrease the risk of diabetes. We will refill Ozempic for 1 month.  - Semaglutide, 1 MG/DOSE, (OZEMPIC, 1 MG/DOSE,) 2 MG/1.5ML SOPN; Inject 1 mg into the skin once a week.  Dispense: 1.5 mL; Refill: 0  2. Somnolence, daytime Felicia Franklin will continue Ozempic, and will continue to monitor.  3. Obesity with current BMI 34.4 Felicia Franklin is currently in the action stage of change. As such, her goal is to continue with weight loss efforts. She has agreed to the Category 2 Plan and keeping a food journal and adhering to recommended goals of 400-600 calories and 35+ grams of protein  at supper daily.   Exercise goals: As is.  Behavioral modification strategies: increasing lean protein intake and emotional eating strategies.  Felicia Franklin has agreed to follow-up with our clinic in 3 weeks. She was informed of the importance of frequent follow-up visits to maximize her success with intensive lifestyle modifications for her multiple health conditions.   Objective:   Blood pressure 114/68, pulse 66, temperature 97.9 F (36.6 C), height 5\' 2"  (1.575 m), weight 188 lb (85.3 kg), last menstrual period 10/03/1998, SpO2 98 %. Body mass index is 34.39 kg/m.  General: Cooperative, alert, well developed, in no acute distress. HEENT: Conjunctivae and lids unremarkable. Cardiovascular: Regular rhythm.  Lungs: Normal work of breathing. Neurologic: No focal deficits.   Lab Results  Component Value Date   CREATININE 0.82 10/08/2020   BUN 16 10/08/2020   NA 140 10/08/2020   K 4.8 10/08/2020   CL 101 10/08/2020   CO2 23 10/08/2020   Lab Results  Component Value Date   ALT 16 10/08/2020   AST 19 10/08/2020   ALKPHOS 99 10/08/2020   BILITOT 0.8 10/08/2020   Lab Results  Component Value Date   HGBA1C 5.6 10/08/2020   Lab Results  Component Value Date   INSULIN 23.3 10/08/2020   Lab Results  Component Value Date   TSH 2.270 10/08/2020   Lab Results  Component Value Date   CHOL 202 (H) 10/08/2020   HDL 61 10/08/2020   LDLCALC 118 (H) 10/08/2020   TRIG 128 10/08/2020  Lab Results  Component Value Date   WBC 5.3 10/08/2020   HGB 14.0 10/08/2020   HCT 42.4 10/08/2020   MCV 91 10/08/2020   PLT 243 10/08/2020   No results found for: IRON, TIBC, FERRITIN  Obesity Behavioral Intervention:   Approximately 15 minutes were spent on the discussion below.  ASK: We discussed the diagnosis of obesity with Felicia Franklin today and Felicia Franklin agreed to give [place] permission to discuss obesity behavioral modification therapy today.  ASSESS: Felicia Franklin has the diagnosis of obesity and her  BMI today is 34.38. Felicia Franklin is in the action stage of change.   ADVISE: Felicia Franklin was educated on the multiple health risks of obesity as well as the benefit of weight loss to improve her health. She was advised of the need for long term treatment and the importance of lifestyle modifications to improve her current health and to decrease her risk of future health problems.  AGREE: Multiple dietary modification options and treatment options were discussed and Felicia Franklin agreed to follow the recommendations documented in the above note.  ARRANGE: Felicia Franklin was educated on the importance of frequent visits to treat obesity as outlined per CMS and USPSTF guidelines and agreed to schedule her next follow up appointment today.  Attestation Statements:   Reviewed by clinician on day of visit: allergies, medications, problem list, medical history, surgical history, family history, social history, and previous encounter notes.   I, Burt Knack, am acting as transcriptionist for Quillian Quince, MD.  I have reviewed the above documentation for accuracy and completeness, and I agree with the above. -  Quillian Quince, MD

## 2021-03-04 DIAGNOSIS — M542 Cervicalgia: Secondary | ICD-10-CM | POA: Diagnosis not present

## 2021-03-11 DIAGNOSIS — M542 Cervicalgia: Secondary | ICD-10-CM | POA: Diagnosis not present

## 2021-03-15 DIAGNOSIS — M1990 Unspecified osteoarthritis, unspecified site: Secondary | ICD-10-CM | POA: Diagnosis not present

## 2021-03-15 DIAGNOSIS — I872 Venous insufficiency (chronic) (peripheral): Secondary | ICD-10-CM | POA: Diagnosis not present

## 2021-03-15 DIAGNOSIS — I10 Essential (primary) hypertension: Secondary | ICD-10-CM | POA: Diagnosis not present

## 2021-03-15 DIAGNOSIS — E669 Obesity, unspecified: Secondary | ICD-10-CM | POA: Diagnosis not present

## 2021-03-15 DIAGNOSIS — K219 Gastro-esophageal reflux disease without esophagitis: Secondary | ICD-10-CM | POA: Diagnosis not present

## 2021-03-17 ENCOUNTER — Encounter (INDEPENDENT_AMBULATORY_CARE_PROVIDER_SITE_OTHER): Payer: Self-pay | Admitting: Family Medicine

## 2021-03-17 ENCOUNTER — Ambulatory Visit (INDEPENDENT_AMBULATORY_CARE_PROVIDER_SITE_OTHER): Payer: Medicare PPO | Admitting: Family Medicine

## 2021-03-17 ENCOUNTER — Other Ambulatory Visit: Payer: Self-pay

## 2021-03-17 VITALS — BP 104/51 | HR 69 | Temp 98.2°F | Ht 62.0 in | Wt 189.0 lb

## 2021-03-17 DIAGNOSIS — Z6836 Body mass index (BMI) 36.0-36.9, adult: Secondary | ICD-10-CM

## 2021-03-17 DIAGNOSIS — E1169 Type 2 diabetes mellitus with other specified complication: Secondary | ICD-10-CM

## 2021-03-18 DIAGNOSIS — M542 Cervicalgia: Secondary | ICD-10-CM | POA: Diagnosis not present

## 2021-03-23 NOTE — Progress Notes (Signed)
Chief Complaint:   OBESITY Felicia Franklin is here to discuss her progress with her obesity treatment plan along with follow-up of her obesity related diagnoses. Felicia Franklin is on the Category 2 Plan and keeping a food journal and adhering to recommended goals of 400-600 calories and 35+ grams of protein at supper daily and states she is following her eating plan approximately 50% of the time. Felicia Franklin states she is doing water class for 1.5 hours 5 times per week.   Today's visit was #: 10 Starting weight: 198 lbs Starting date: 10/08/2020 Today's weight: 189 lbs Today's date: 03/17/2021 Total lbs lost to date: 9 Total lbs lost since last in-office visit: 0  Interim History: Felicia Franklin is retaining a little water weight, but she has done well maintaining her weight. She did some celebration eating, but she was mindful of her choices. 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  Subjective:   1. Type 2 diabetes mellitus with other specified complication, without long-term current use of insulin (HCC) Felicia Franklin is stable on Ozempic, and she denies nausea or vomiting. She is working on weight loss and activity. She denies signs of hypoglycemia.  Assessment/Plan:   1. Type 2 diabetes mellitus with other specified complication, without long-term current use of insulin (HCC) Felicia Franklin will continue Ozempic and her diet prescription, and will continue to monitor. Good blood sugar control is important to decrease the likelihood of diabetic complications such as nephropathy, neuropathy, limb loss, blindness, coronary artery disease, and death. Intensive lifestyle modification including diet, exercise and weight loss are the first line of treatment for diabetes.   2. Obesity with current BMI 34.6 Felicia Franklin is currently in the action stage of change. As such, her goal is to continue with weight loss efforts. She has agreed to the Category 2 Plan and keeping a food journal and adhering to recommended goals of 400-600 calories and  35+ grams of protein at supper daily.   Exercise goals: As is.  Behavioral modification strategies: increasing lean protein intake and increasing water intake.  Felicia Franklin has agreed to follow-up with our clinic in 3 weeks. She was informed of the importance of frequent follow-up visits to maximize her success with intensive lifestyle modifications for her multiple health conditions.   Objective:   Blood pressure (!) 104/51, pulse 69, temperature 98.2 F (36.8 C), height 5\' 2"  (1.575 m), weight 189 lb (85.7 kg), last menstrual period 10/03/1998, SpO2 95 %. Body mass index is 34.57 kg/m.  General: Cooperative, alert, well developed, in no acute distress. HEENT: Conjunctivae and lids unremarkable. Cardiovascular: Regular rhythm.  Lungs: Normal work of breathing. Neurologic: No focal deficits.   Lab Results  Component Value Date   CREATININE 0.82 10/08/2020   BUN 16 10/08/2020   NA 140 10/08/2020   K 4.8 10/08/2020   CL 101 10/08/2020   CO2 23 10/08/2020   Lab Results  Component Value Date   ALT 16 10/08/2020   AST 19 10/08/2020   ALKPHOS 99 10/08/2020   BILITOT 0.8 10/08/2020   Lab Results  Component Value Date   HGBA1C 5.6 10/08/2020   Lab Results  Component Value Date   INSULIN 23.3 10/08/2020   Lab Results  Component Value Date   TSH 2.270 10/08/2020   Lab Results  Component Value Date   CHOL 202 (H) 10/08/2020   HDL 61 10/08/2020   LDLCALC 118 (H) 10/08/2020   TRIG 128 10/08/2020   Lab Results  Component Value Date   WBC 5.3  10/08/2020   HGB 14.0 10/08/2020   HCT 42.4 10/08/2020   MCV 91 10/08/2020   PLT 243 10/08/2020   No results found for: IRON, TIBC, FERRITIN  Obesity Behavioral Intervention:   Approximately 15 minutes were spent on the discussion below.  ASK: We discussed the diagnosis of obesity with Felicia Franklin today and Felicia Franklin agreed to give [place] permission to discuss obesity behavioral modification therapy today.  ASSESS: Felicia Franklin has the diagnosis  of obesity and her BMI today is 34.56. Felicia Franklin is in the action stage of change.   ADVISE: Felicia Franklin was educated on the multiple health risks of obesity as well as the benefit of weight loss to improve her health. She was advised of the need for long term treatment and the importance of lifestyle modifications to improve her current health and to decrease her risk of future health problems.  AGREE: Multiple dietary modification options and treatment options were discussed and Felicia Franklin agreed to follow the recommendations documented in the above note.  ARRANGE: Felicia Franklin was educated on the importance of frequent visits to treat obesity as outlined per CMS and USPSTF guidelines and agreed to schedule her next follow up appointment today.  Attestation Statements:   Reviewed by clinician on day of visit: allergies, medications, problem list, medical history, surgical history, family history, social history, and previous encounter notes.   I, Burt Knack, am acting as transcriptionist for Quillian Quince, MD.  I have reviewed the above documentation for accuracy and completeness, and I agree with the above. -  Quillian Quince, MD

## 2021-04-03 ENCOUNTER — Other Ambulatory Visit (INDEPENDENT_AMBULATORY_CARE_PROVIDER_SITE_OTHER): Payer: Self-pay | Admitting: Family Medicine

## 2021-04-03 DIAGNOSIS — R7303 Prediabetes: Secondary | ICD-10-CM

## 2021-04-06 ENCOUNTER — Encounter (INDEPENDENT_AMBULATORY_CARE_PROVIDER_SITE_OTHER): Payer: Self-pay

## 2021-04-06 NOTE — Telephone Encounter (Signed)
Message sent to pt-[facility] 

## 2021-04-07 ENCOUNTER — Encounter (INDEPENDENT_AMBULATORY_CARE_PROVIDER_SITE_OTHER): Payer: Self-pay | Admitting: Family Medicine

## 2021-04-07 ENCOUNTER — Other Ambulatory Visit: Payer: Self-pay

## 2021-04-07 ENCOUNTER — Ambulatory Visit (INDEPENDENT_AMBULATORY_CARE_PROVIDER_SITE_OTHER): Payer: Medicare PPO | Admitting: Family Medicine

## 2021-04-07 VITALS — BP 111/70 | HR 78 | Temp 98.2°F | Ht 62.0 in | Wt 188.0 lb

## 2021-04-07 DIAGNOSIS — F3289 Other specified depressive episodes: Secondary | ICD-10-CM

## 2021-04-07 DIAGNOSIS — E1169 Type 2 diabetes mellitus with other specified complication: Secondary | ICD-10-CM | POA: Diagnosis not present

## 2021-04-07 DIAGNOSIS — Z6836 Body mass index (BMI) 36.0-36.9, adult: Secondary | ICD-10-CM | POA: Diagnosis not present

## 2021-04-07 MED ORDER — OZEMPIC (1 MG/DOSE) 2 MG/1.5ML ~~LOC~~ SOPN: 1.0000 mg | PEN_INJECTOR | SUBCUTANEOUS | 0 refills | Status: DC

## 2021-04-14 NOTE — Progress Notes (Signed)
Chief Complaint:   OBESITY Felicia Franklin is here to discuss her progress with her obesity treatment plan along with follow-up of her obesity related diagnoses. Felicia Franklin is on the Category 2 Plan and keeping a food journal and adhering to recommended goals of 400-600 calories and 35+ grams of protein at supper daily and states she is following her eating plan approximately 50% of the time. Felicia Franklin states she is doing water aerobics for 142 minutes 12 times per week.  Today's visit was #: 11 Starting weight: 198 lbs Starting date: 10/08/2020 Today's weight: 188 lbs Today's date: 04/07/2021 Total lbs lost to date: 10 Total lbs lost since last in-office visit: 1  Interim History: Felicia Franklin continues to do well with weight loss. 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 She has had more cravings lately.  Subjective:   1. Type 2 diabetes mellitus with other specified complication, without long-term current use of insulin (HCC) Felicia Franklin is stable on Ozempic, and she is doing well with diet and exercise and her hunger has been more controlled.  2. Other depression with emotional eating Felicia Franklin notes increased cravings and emotional eating behaviors. 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  Assessment/Plan:   1. Type 2 diabetes mellitus with other specified complication, without long-term current use of insulin (HCC) We will refill Ozempic for 1 month. Good blood sugar control is important to decrease the likelihood of diabetic complications such as nephropathy, neuropathy, limb loss, blindness, coronary artery disease, and death. Intensive lifestyle modification including diet, exercise and weight loss are the first line of treatment for diabetes.   - Semaglutide, 1 MG/DOSE, (OZEMPIC, 1 MG/DOSE,) 2 MG/1.5ML SOPN; Inject 1 mg into the skin once a week.  Dispense: 1.5 mL; Refill: 0  2. Other depression with emotional eating Emotional  eating behaviors were discussed today to help Felicia Franklin deal with her emotional/non-hunger eating behaviors. We will continue to monitor. Orders and follow up as documented in patient record.   3. Obesity with current BMI 34.5 Felicia Franklin is currently in the action stage of change. As such, her goal is to continue with weight loss efforts. She has agreed to the Category 2 Plan and keeping a food journal and adhering to recommended goals of 400-600 calories and 35+ grams of protein at supper daily.   Exercise goals: As is.  Behavioral modification strategies: increasing water intake and emotional eating strategies.  Felicia Franklin has agreed to follow-up with our clinic in 3 weeks. She was informed of the importance of frequent follow-up visits to maximize her success with intensive lifestyle modifications for her multiple health conditions.   Objective:   Blood pressure 111/70, pulse 78, temperature 98.2 F (36.8 C), height 5\' 2"  (1.575 m), weight 188 lb (85.3 kg), last menstrual period 10/03/1998, SpO2 98 %. Body mass index is 34.39 kg/m.  General: Cooperative, alert, well developed, in no acute distress. HEENT: Conjunctivae and lids unremarkable. Cardiovascular: Regular rhythm.  Lungs: Normal work of breathing. Neurologic: No focal deficits.   Lab Results  Component Value Date   CREATININE 0.82 10/08/2020   BUN 16 10/08/2020   NA 140 10/08/2020   K 4.8 10/08/2020   CL 101 10/08/2020   CO2 23 10/08/2020   Lab Results  Component Value Date   ALT 16 10/08/2020   AST 19 10/08/2020   ALKPHOS 99 10/08/2020   BILITOT 0.8 10/08/2020   Lab Results  Component Value Date   HGBA1C 5.6 10/08/2020   Lab Results  Component Value Date   INSULIN 23.3 10/08/2020   Lab Results  Component Value Date   TSH 2.270 10/08/2020   Lab Results  Component Value Date   CHOL 202 (H) 10/08/2020   HDL 61 10/08/2020   LDLCALC 118 (H) 10/08/2020   TRIG 128 10/08/2020   Lab Results  Component Value Date    VD25OH 66.0 10/08/2020   VD25OH 56 10/08/2013   Lab Results  Component Value Date   WBC 5.3 10/08/2020   HGB 14.0 10/08/2020   HCT 42.4 10/08/2020   MCV 91 10/08/2020   PLT 243 10/08/2020   No results found for: IRON, TIBC, FERRITIN  Obesity Behavioral Intervention:   Approximately 15 minutes were spent on the discussion below.  ASK: We discussed the diagnosis of obesity with Felicia Franklin today and Felicia Franklin agreed to give [place] permission to discuss obesity behavioral modification therapy today.  ASSESS: Felicia Franklin has the diagnosis of obesity and her BMI today is 34.38. Felicia Franklin is in the action stage of change.   ADVISE: Felicia Franklin was educated on the multiple health risks of obesity as well as the benefit of weight loss to improve her health. She was advised of the need for long term treatment and the importance of lifestyle modifications to improve her current health and to decrease her risk of future health problems.  AGREE: Multiple dietary modification options and treatment options were discussed and Felicia Franklin agreed to follow the recommendations documented in the above note.  ARRANGE: Felicia Franklin was educated on the importance of frequent visits to treat obesity as outlined per CMS and USPSTF guidelines and agreed to schedule her next follow up appointment today.  Attestation Statements:   Reviewed by clinician on day of visit: allergies, medications, problem list, medical history, surgical history, family history, social history, and previous encounter notes.   I, Burt Knack, am acting as transcriptionist for Quillian Quince, MD.  I have reviewed the above documentation for accuracy and completeness, and I agree with the above. -  Quillian Quince, MD

## 2021-04-27 ENCOUNTER — Encounter (INDEPENDENT_AMBULATORY_CARE_PROVIDER_SITE_OTHER): Payer: Self-pay

## 2021-04-28 ENCOUNTER — Other Ambulatory Visit: Payer: Self-pay

## 2021-04-28 ENCOUNTER — Ambulatory Visit (INDEPENDENT_AMBULATORY_CARE_PROVIDER_SITE_OTHER): Payer: Medicare PPO | Admitting: Family Medicine

## 2021-04-28 ENCOUNTER — Encounter (INDEPENDENT_AMBULATORY_CARE_PROVIDER_SITE_OTHER): Payer: Self-pay | Admitting: Family Medicine

## 2021-04-28 VITALS — BP 122/72 | HR 70 | Temp 98.3°F | Ht 62.0 in | Wt 189.0 lb

## 2021-04-28 DIAGNOSIS — Z6836 Body mass index (BMI) 36.0-36.9, adult: Secondary | ICD-10-CM | POA: Diagnosis not present

## 2021-04-28 DIAGNOSIS — E1169 Type 2 diabetes mellitus with other specified complication: Secondary | ICD-10-CM

## 2021-04-28 MED ORDER — SEMAGLUTIDE (2 MG/DOSE) 8 MG/3ML ~~LOC~~ SOPN
2.0000 mg | PEN_INJECTOR | SUBCUTANEOUS | 0 refills | Status: DC
Start: 2021-04-28 — End: 2021-07-20

## 2021-05-04 NOTE — Progress Notes (Signed)
Chief Complaint:   OBESITY Felicia Franklin is here to discuss her progress with her obesity treatment plan along with follow-up of her obesity related diagnoses. Felicia Franklin is on the Category 2 Plan and keeping a food journal and adhering to recommended goals of 400-600 calories and 35+ grams of protein at supper daily and states she is following her eating plan approximately 30% of the time. Felicia Franklin states she is doing water aerobics for 45 minutes 5 times per week.  Today's visit was #: 12 Starting weight: 198 lbs Starting date: 10/08/2020 Today's weight: 189 lbs Today's date: 04/28/2021 Total lbs lost to date: 9 Total lbs lost since last in-office visit: 0  Interim History: Felicia Franklin is working on eating healthier and portion controlling, but she is stalling a bit with her weight loss. She is open to journaling if that will help.  Subjective:   1. Type 2 diabetes mellitus with other specified complication, without long-term current use of insulin (HCC) Felicia Franklin has been on Ozempic 1 mg dose for approximately 4 months. No nausea or vomiting was noted but she still struggles with polyphagia.  Assessment/Plan:   1. Type 2 diabetes mellitus with other specified complication, without long-term current use of insulin (HCC) Felicia Franklin agreed to increase Ozempic to 2 mg q weekly with no refills. Good blood sugar control is important to decrease the likelihood of diabetic complications such as nephropathy, neuropathy, limb loss, blindness, coronary artery disease, and death. Intensive lifestyle modification including diet, exercise and weight loss are the first line of treatment for diabetes.   - Semaglutide, 2 MG/DOSE, 8 MG/3ML SOPN; Inject 2 mg as directed once a week.  Dispense: 3 mL; Refill: 0  2. Obesity with current BMI 34.7 Felicia Franklin is currently in the action stage of change. As such, her goal is to continue with weight loss efforts. She has agreed to keeping a food journal and adhering to recommended goals of  1000-1200 calories and 75+ grams of protein daily.   Felicia Franklin was shown how to journal on My[employer]itness Pal, and higher protein food ideas were discussed.  Exercise goals: As is.  Behavioral modification strategies: increasing lean protein intake.  Felicia Franklin has agreed to follow-up with our clinic in 3 weeks. She was informed of the importance of frequent follow-up visits to maximize her success with intensive lifestyle modifications for her multiple health conditions.   Objective:   Blood pressure 122/72, pulse 70, temperature 98.3 F (36.8 C), height 5\' 2"  (1.575 m), weight 189 lb (85.7 kg), last menstrual period 10/03/1998, SpO2 96 %. Body mass index is 34.57 kg/m.  General: Cooperative, alert, well developed, in no acute distress. HEENT: Conjunctivae and lids unremarkable. Cardiovascular: Regular rhythm.  Lungs: Normal work of breathing. Neurologic: No focal deficits.   Lab Results  Component Value Date   CREATININE 0.82 10/08/2020   BUN 16 10/08/2020   NA 140 10/08/2020   K 4.8 10/08/2020   CL 101 10/08/2020   CO2 23 10/08/2020   Lab Results  Component Value Date   ALT 16 10/08/2020   AST 19 10/08/2020   ALKPHOS 99 10/08/2020   BILITOT 0.8 10/08/2020   Lab Results  Component Value Date   HGBA1C 5.6 10/08/2020   Lab Results  Component Value Date   INSULIN 23.3 10/08/2020   Lab Results  Component Value Date   TSH 2.270 10/08/2020   Lab Results  Component Value Date   CHOL 202 (H) 10/08/2020   HDL 61 10/08/2020   LDLCALC 118 (  H) 10/08/2020   TRIG 128 10/08/2020   Lab Results  Component Value Date   VD25OH 66.0 10/08/2020   VD25OH 56 10/08/2013   Lab Results  Component Value Date   WBC 5.3 10/08/2020   HGB 14.0 10/08/2020   HCT 42.4 10/08/2020   MCV 91 10/08/2020   PLT 243 10/08/2020   No results found for: IRON, TIBC, FERRITIN  Attestation Statements:   Reviewed by clinician on day of visit: allergies, medications, problem list, medical history,  surgical history, family history, social history, and previous encounter notes.  Time spent on visit including pre-visit chart review and post-visit care and charting was 42 minutes.    I, Burt Knack, am acting as transcriptionist for Quillian Quince, MD.  I have reviewed the above documentation for accuracy and completeness, and I agree with the above. -  Quillian Quince, MD

## 2021-05-06 DIAGNOSIS — Z20822 Contact with and (suspected) exposure to covid-19: Secondary | ICD-10-CM | POA: Diagnosis not present

## 2021-05-19 ENCOUNTER — Other Ambulatory Visit: Payer: Self-pay

## 2021-05-19 ENCOUNTER — Encounter (INDEPENDENT_AMBULATORY_CARE_PROVIDER_SITE_OTHER): Payer: Self-pay | Admitting: Family Medicine

## 2021-05-19 ENCOUNTER — Ambulatory Visit (INDEPENDENT_AMBULATORY_CARE_PROVIDER_SITE_OTHER): Payer: Medicare PPO | Admitting: Family Medicine

## 2021-05-19 VITALS — BP 118/74 | HR 73 | Temp 97.8°F | Ht 62.0 in | Wt 188.0 lb

## 2021-05-19 DIAGNOSIS — E1169 Type 2 diabetes mellitus with other specified complication: Secondary | ICD-10-CM

## 2021-05-19 DIAGNOSIS — Z6836 Body mass index (BMI) 36.0-36.9, adult: Secondary | ICD-10-CM

## 2021-05-20 NOTE — Progress Notes (Signed)
Chief Complaint:   OBESITY Lisbeth is here to discuss her progress with her obesity treatment plan along with follow-up of her obesity related diagnoses. Toniqua is on keeping a food journal and adhering to recommended goals of 1000-1200 calories and 75+ grams of protein daily and states she is following her eating plan approximately 40% of the time. Jerry states she is doing water aerobics for 145 minutes 5 times per week.  Today's visit was #: 13 Starting weight: 198 lbs Starting date: 10/08/2020 Today's weight: 188 lbs Today's date: 05/19/2021 Total lbs lost to date: 10 Total lbs lost since last in-office visit: 1  Interim History: Shabreka continues to do well with weight loss. She has had extra challenges in the last few weeks.  Subjective:   1. Type 2 diabetes mellitus with other specified complication, without long-term current use of insulin (HCC) Lidie is on Ozempic 2 mg, and she continues to work on diet and weight loss.  Assessment/Plan:   1. Type 2 diabetes mellitus with other specified complication, without long-term current use of insulin (HCC) Kamoria will continue Ozempic and will continue to wok on increasing protein to help keep her at goal. Good blood sugar control is important to decrease the likelihood of diabetic complications such as nephropathy, neuropathy, limb loss, blindness, coronary artery disease, and death. Intensive lifestyle modification including diet, exercise and weight loss are the first line of treatment for diabetes.   2. Obesity with current BMI 34.5 Fredricka is currently in the action stage of change. As such, her goal is to continue with weight loss efforts. She has agreed to keeping a food journal and adhering to recommended goals of 1000-1200 calories and 75+ grams of protein daily.   Protein recipes were given.  Exercise goals: As is.  Behavioral modification strategies: increasing lean protein intake and keeping a strict food journal.  Asjah  has agreed to follow-up with our clinic in 2 to 3 weeks. She was informed of the importance of frequent follow-up visits to maximize her success with intensive lifestyle modifications for her multiple health conditions.   Objective:   Blood pressure 118/74, pulse 73, temperature 97.8 F (36.6 C), height 5\' 2"  (1.575 m), weight 188 lb (85.3 kg), last menstrual period 10/03/1998, SpO2 97 %. Body mass index is 34.39 kg/m.  General: Cooperative, alert, well developed, in no acute distress. HEENT: Conjunctivae and lids unremarkable. Cardiovascular: Regular rhythm.  Lungs: Normal work of breathing. Neurologic: No focal deficits.   Lab Results  Component Value Date   CREATININE 0.82 10/08/2020   BUN 16 10/08/2020   NA 140 10/08/2020   K 4.8 10/08/2020   CL 101 10/08/2020   CO2 23 10/08/2020   Lab Results  Component Value Date   ALT 16 10/08/2020   AST 19 10/08/2020   ALKPHOS 99 10/08/2020   BILITOT 0.8 10/08/2020   Lab Results  Component Value Date   HGBA1C 5.6 10/08/2020   Lab Results  Component Value Date   INSULIN 23.3 10/08/2020   Lab Results  Component Value Date   TSH 2.270 10/08/2020   Lab Results  Component Value Date   CHOL 202 (H) 10/08/2020   HDL 61 10/08/2020   LDLCALC 118 (H) 10/08/2020   TRIG 128 10/08/2020   Lab Results  Component Value Date   VD25OH 66.0 10/08/2020   VD25OH 56 10/08/2013   Lab Results  Component Value Date   WBC 5.3 10/08/2020   HGB 14.0 10/08/2020   HCT  42.4 10/08/2020   MCV 91 10/08/2020   PLT 243 10/08/2020   No results found for: IRON, TIBC, FERRITIN  Attestation Statements:   Reviewed by clinician on day of visit: allergies, medications, problem list, medical history, surgical history, family history, social history, and previous encounter notes.  Time spent on visit including pre-visit chart review and post-visit care and charting was 32 minutes.    I, Burt Knack, am acting as transcriptionist for Quillian Quince, MD.  I have reviewed the above documentation for accuracy and completeness, and I agree with the above. -  Quillian Quince, MD

## 2021-05-21 DIAGNOSIS — H25013 Cortical age-related cataract, bilateral: Secondary | ICD-10-CM | POA: Diagnosis not present

## 2021-05-21 DIAGNOSIS — H2513 Age-related nuclear cataract, bilateral: Secondary | ICD-10-CM | POA: Diagnosis not present

## 2021-05-21 DIAGNOSIS — H35363 Drusen (degenerative) of macula, bilateral: Secondary | ICD-10-CM | POA: Diagnosis not present

## 2021-05-21 DIAGNOSIS — H524 Presbyopia: Secondary | ICD-10-CM | POA: Diagnosis not present

## 2021-05-21 DIAGNOSIS — H35033 Hypertensive retinopathy, bilateral: Secondary | ICD-10-CM | POA: Diagnosis not present

## 2021-05-24 DIAGNOSIS — Z1231 Encounter for screening mammogram for malignant neoplasm of breast: Secondary | ICD-10-CM | POA: Diagnosis not present

## 2021-05-30 ENCOUNTER — Other Ambulatory Visit (INDEPENDENT_AMBULATORY_CARE_PROVIDER_SITE_OTHER): Payer: Self-pay | Admitting: Family Medicine

## 2021-05-30 DIAGNOSIS — E1169 Type 2 diabetes mellitus with other specified complication: Secondary | ICD-10-CM

## 2021-05-31 ENCOUNTER — Encounter (INDEPENDENT_AMBULATORY_CARE_PROVIDER_SITE_OTHER): Payer: Self-pay | Admitting: Emergency Medicine

## 2021-05-31 NOTE — Telephone Encounter (Signed)
Mychart msg sent

## 2021-06-09 ENCOUNTER — Other Ambulatory Visit: Payer: Self-pay

## 2021-06-09 ENCOUNTER — Ambulatory Visit (INDEPENDENT_AMBULATORY_CARE_PROVIDER_SITE_OTHER): Payer: Medicare PPO | Admitting: Family Medicine

## 2021-06-09 ENCOUNTER — Encounter (INDEPENDENT_AMBULATORY_CARE_PROVIDER_SITE_OTHER): Payer: Self-pay | Admitting: Family Medicine

## 2021-06-09 VITALS — BP 114/58 | HR 70 | Temp 98.2°F | Ht 62.0 in | Wt 189.0 lb

## 2021-06-09 DIAGNOSIS — F3289 Other specified depressive episodes: Secondary | ICD-10-CM

## 2021-06-09 DIAGNOSIS — E8881 Metabolic syndrome: Secondary | ICD-10-CM

## 2021-06-09 DIAGNOSIS — Z6836 Body mass index (BMI) 36.0-36.9, adult: Secondary | ICD-10-CM | POA: Diagnosis not present

## 2021-06-09 MED ORDER — BUPROPION HCL ER (SR) 150 MG PO TB12: 150.0000 mg | ORAL_TABLET | Freq: Every day | ORAL | 0 refills | Status: DC

## 2021-06-09 NOTE — Progress Notes (Signed)
Chief Complaint:   OBESITY Felicia Franklin is here to discuss her progress with her obesity treatment plan along with follow-up of her obesity related diagnoses. Felicia Franklin is on keeping a food journal and adhering to recommended goals of 1000-1200 calories and 75+ grams of protein daily and states she is following her eating plan approximately 0% of the time. Felicia Franklin states she is water aerobics for 145 minutes 5 times per week.  Today's visit was #: 14 Starting weight: 198 lbs Starting date: 10/08/2020 Today's weight: 189 lbs Today's date: 06/09/2021 Total lbs lost to date: 9 Total lbs lost since last in-office visit: 0  Interim History: Felicia Franklin has not followed her plan for the last few weeks. She has done well minimizing weight gain, but she has had increased temptations. She is frustrated at times which is understandable.  Subjective:   1. Insulin resistance Felicia Franklin was on Ozempic but noted no change in polyphagia, so she stopped and did not notice a difference.   2. Other depression with emotional eating Felicia Franklin notices increased emotional eating behaviors and cravings. She is open to looking at other medication options.  Assessment/Plan:   1. Insulin resistance Felicia Franklin agreed to discontinue Ozempic for now, and we will follow up at her next visit. She will continue with diet, weight loss, and decreasing simple carbohydrates to help decrease the risk of diabetes. Felicia Franklin agreed to follow-up with [place] as directed to closely monitor her progress.  2. Other depression with emotional eating Behavior modification techniques were discussed today to help Felicia Franklin deal with her emotional/non-hunger eating behaviors. Felicia Franklin agreed to start Wellbutrin SR 150 mg q AM with no refills. Orders and follow up as documented in patient record.   - buPROPion (WELLBUTRIN SR) 150 MG 12 hr tablet; Take 1 tablet (150 mg total) by mouth daily. Take in the morning daily.  Dispense: 30 tablet; Refill: 0  3. Obesity with current BMI  34.7 Felicia Franklin is currently in the action stage of change. As such, her goal is to continue with weight loss efforts. She has agreed to the Category 2 Plan and keeping a food journal and adhering to recommended goals of 400-550 calories and 40+ grams of protein at supper daily.   We will recheck fasting IC at her next visit.  Exercise goals: As is.  Behavioral modification strategies: increasing lean protein intake and keeping a strict food journal.  Felicia Franklin has agreed to follow-up with our clinic in 3 weeks. She was informed of the importance of frequent follow-up visits to maximize her success with intensive lifestyle modifications for her multiple health conditions.   Objective:   Blood pressure (!) 114/58, pulse 70, temperature 98.2 F (36.8 C), height 5\' 2"  (1.575 m), weight 189 lb (85.7 kg), last menstrual period 10/03/1998, SpO2 97 %. Body mass index is 34.57 kg/m.  General: Cooperative, alert, well developed, in no acute distress. HEENT: Conjunctivae and lids unremarkable. Cardiovascular: Regular rhythm.  Lungs: Normal work of breathing. Neurologic: No focal deficits.   Lab Results  Component Value Date   CREATININE 0.82 10/08/2020   BUN 16 10/08/2020   NA 140 10/08/2020   K 4.8 10/08/2020   CL 101 10/08/2020   CO2 23 10/08/2020   Lab Results  Component Value Date   ALT 16 10/08/2020   AST 19 10/08/2020   ALKPHOS 99 10/08/2020   BILITOT 0.8 10/08/2020   Lab Results  Component Value Date   HGBA1C 5.6 10/08/2020   Lab Results  Component Value Date  INSULIN 23.3 10/08/2020   Lab Results  Component Value Date   TSH 2.270 10/08/2020   Lab Results  Component Value Date   CHOL 202 (H) 10/08/2020   HDL 61 10/08/2020   LDLCALC 118 (H) 10/08/2020   TRIG 128 10/08/2020   Lab Results  Component Value Date   VD25OH 66.0 10/08/2020   VD25OH 56 10/08/2013   Lab Results  Component Value Date   WBC 5.3 10/08/2020   HGB 14.0 10/08/2020   HCT 42.4 10/08/2020   MCV  91 10/08/2020   PLT 243 10/08/2020   No results found for: IRON, TIBC, FERRITIN  Attestation Statements:   Reviewed by clinician on day of visit: allergies, medications, problem list, medical history, surgical history, family history, social history, and previous encounter notes.  Time spent on visit including pre-visit chart review and post-visit care and charting was 42 minutes.    I, Burt Knack, am acting as transcriptionist for Quillian Quince, MD.  I have reviewed the above documentation for accuracy and completeness, and I agree with the above. -  Quillian Quince, MD

## 2021-06-21 ENCOUNTER — Other Ambulatory Visit: Payer: Self-pay | Admitting: Cardiology

## 2021-06-30 ENCOUNTER — Ambulatory Visit (INDEPENDENT_AMBULATORY_CARE_PROVIDER_SITE_OTHER): Payer: Medicare PPO | Admitting: Family Medicine

## 2021-07-05 ENCOUNTER — Other Ambulatory Visit (INDEPENDENT_AMBULATORY_CARE_PROVIDER_SITE_OTHER): Payer: Self-pay | Admitting: Family Medicine

## 2021-07-05 DIAGNOSIS — F3289 Other specified depressive episodes: Secondary | ICD-10-CM

## 2021-07-05 DIAGNOSIS — D485 Neoplasm of uncertain behavior of skin: Secondary | ICD-10-CM | POA: Diagnosis not present

## 2021-07-05 DIAGNOSIS — D230 Other benign neoplasm of skin of lip: Secondary | ICD-10-CM | POA: Diagnosis not present

## 2021-07-05 DIAGNOSIS — D1801 Hemangioma of skin and subcutaneous tissue: Secondary | ICD-10-CM | POA: Diagnosis not present

## 2021-07-05 NOTE — Telephone Encounter (Signed)
Left a msg on patient machine to see if she would need a refill on her Wellbutrin before her next appt on 07/12/21

## 2021-07-05 NOTE — Telephone Encounter (Signed)
Patient states she need a refill before her next appt LAST APPOINTMENT DATE: 06/09/21 NEXT APPOINTMENT DATE: 07/12/21   [id number] - [place], [place] - [address] AT [facility] [address] [place] [address] Phone: [phone] Fax: [phone]  [id number] - [place], [place] - [address] AT [facility] [address] [place] [address] Phone: [phone] Fax: [phone]  Patient is requesting a refill of the following medications: Requested Prescriptions   Pending Prescriptions Disp Refills   buPROPion (WELLBUTRIN SR) 150 MG 12 hr tablet [Pharmacy Med Name: BUPROPION SR 150MG  TABLETS (12 H)] 30 tablet 0    Sig: TAKE 1 TABLET(150 MG) BY MOUTH DAILY IN THE MORNING    Date last filled: 06/09/21 Previously prescribed by [facility]  Lab Results  Component Value Date   HGBA1C 5.6 10/08/2020   Lab Results  Component Value Date   LDLCALC 118 (H) 10/08/2020   CREATININE 0.82 10/08/2020   Lab Results  Component Value Date   VD25OH 66.0 10/08/2020   VD25OH 56 10/08/2013    BP Readings from Last 3 Encounters:  06/09/21 (!) 114/58  05/19/21 118/74  04/28/21 122/72

## 2021-07-12 ENCOUNTER — Encounter (INDEPENDENT_AMBULATORY_CARE_PROVIDER_SITE_OTHER): Payer: Self-pay | Admitting: Family Medicine

## 2021-07-12 ENCOUNTER — Other Ambulatory Visit: Payer: Self-pay

## 2021-07-12 ENCOUNTER — Ambulatory Visit (INDEPENDENT_AMBULATORY_CARE_PROVIDER_SITE_OTHER): Payer: Medicare PPO | Admitting: Family Medicine

## 2021-07-12 VITALS — BP 126/75 | HR 55 | Temp 97.6°F | Ht 62.0 in | Wt 190.0 lb

## 2021-07-12 DIAGNOSIS — E1169 Type 2 diabetes mellitus with other specified complication: Secondary | ICD-10-CM | POA: Diagnosis not present

## 2021-07-12 DIAGNOSIS — Z6836 Body mass index (BMI) 36.0-36.9, adult: Secondary | ICD-10-CM

## 2021-07-12 DIAGNOSIS — F3289 Other specified depressive episodes: Secondary | ICD-10-CM

## 2021-07-12 DIAGNOSIS — E559 Vitamin D deficiency, unspecified: Secondary | ICD-10-CM

## 2021-07-12 DIAGNOSIS — I10 Essential (primary) hypertension: Secondary | ICD-10-CM

## 2021-07-12 DIAGNOSIS — R0602 Shortness of breath: Secondary | ICD-10-CM

## 2021-07-12 DIAGNOSIS — E785 Hyperlipidemia, unspecified: Secondary | ICD-10-CM | POA: Diagnosis not present

## 2021-07-12 DIAGNOSIS — R7303 Prediabetes: Secondary | ICD-10-CM

## 2021-07-13 LAB — LIPID PANEL WITH LDL/HDL RATIO
Cholesterol, Total: 242 mg/dL — ABNORMAL HIGH (ref 100–199)
HDL: 62 mg/dL (ref >39)
LDL Chol Calc (NIH): 158 mg/dL — ABNORMAL HIGH (ref 0–99)
LDL/HDL Ratio: 2.5 ratio (ref 0.0–3.2)
Triglycerides: 125 mg/dL (ref 0–149)
VLDL Cholesterol Cal: 22 mg/dL (ref 5–40)

## 2021-07-13 LAB — CMP14+EGFR
ALT: 18 IU/L (ref 0–32)
AST: 22 IU/L (ref 0–40)
Albumin/Globulin Ratio: 1.4 (ref 1.2–2.2)
Albumin: 4.2 g/dL (ref 3.7–4.7)
Alkaline Phosphatase: 101 IU/L (ref 44–121)
BUN/Creatinine Ratio: 22 (ref 12–28)
BUN: 23 mg/dL (ref 8–27)
Bilirubin Total: 0.8 mg/dL (ref 0.0–1.2)
CO2: 21 mmol/L (ref 20–29)
Calcium: 10 mg/dL (ref 8.7–10.3)
Chloride: 98 mmol/L (ref 96–106)
Creatinine, Ser: 1.03 mg/dL — ABNORMAL HIGH (ref 0.57–1.00)
Globulin, Total: 2.9 g/dL (ref 1.5–4.5)
Glucose: 98 mg/dL (ref 70–99)
Potassium: 5.3 mmol/L — ABNORMAL HIGH (ref 3.5–5.2)
Sodium: 136 mmol/L (ref 134–144)
Total Protein: 7.1 g/dL (ref 6.0–8.5)
eGFR: 57 mL/min/{1.73_m2} — ABNORMAL LOW (ref >59)

## 2021-07-13 LAB — HEMOGLOBIN A1C
Est. average glucose Bld gHb Est-mCnc: 111 mg/dL
Hgb A1c MFr Bld: 5.5 % (ref 4.8–5.6)

## 2021-07-13 LAB — VITAMIN D 25 HYDROXY (VIT D DEFICIENCY, FRACTURES): Vit D, 25-Hydroxy: 52.1 ng/mL (ref 30.0–100.0)

## 2021-07-13 LAB — MICROALBUMIN / CREATININE URINE RATIO
Creatinine, Urine: 46.2 mg/dL
Microalb/Creat Ratio: <6 mg/g creat (ref 0–29)
Microalbumin, Urine: <3 ug/mL

## 2021-07-13 LAB — INSULIN, RANDOM: INSULIN: 22.3 u[IU]/mL (ref 2.6–24.9)

## 2021-07-13 NOTE — Progress Notes (Signed)
Chief Complaint:   OBESITY Tomika is here to discuss her progress with her obesity treatment plan along with follow-up of her obesity related diagnoses. Nikira is on the Category 2 Plan and keeping a food journal and adhering to recommended goals of 400-500 calories and 40+ grams of protein at supper daily and states she is following her eating plan approximately 0% of the time. Carlicia states she is doing water aerobics for 2 hours 5 times per week.   Today's visit was #: 15 Starting weight: 198 lbs Starting date: 10/08/2020 Today's weight: 190 lbs Today's date: 07/12/2021 Total lbs lost to date: 8 Total lbs lost since last in-office visit: 0  Interim History: Monzerrath continues to work on her diet and weight loss. She is working on [employer] protein. She still has sweet cravings after dinner.  Subjective:   1. Vitamin D deficiency Bushra is on Vit D, she notes fatigue has improved and she is due for labs.  2. Hyperlipidemia, unspecified hyperlipidemia type Lillia is on statin, and she is working on her diet. She denies chest pain or myalgias.  3. Type 2 diabetes mellitus with other specified complication, without long-term current use of insulin (HCC) Talin had stopped her Ozempic due to miscommunication. She is willing to restart, but she has been off for 1 month.  4. Essential hypertension Laetitia's blood pressure is stable on her medications. She is due for labs, and she is doing well with her diet and exercise.  5. SOB (shortness of breath) on exertion Lalita is working on exercise and this has improved. She is due for a repeat IC today.  Assessment/Plan:   1. Vitamin D deficiency Low Vitamin D level contributes to fatigue and are associated with obesity, breast, and colon cancer. We will check labs today. Amiayah will follow-up for routine testing of Vitamin D, at least 2-3 times per year to avoid over-replacement.  - VITAMIN D 25 Hydroxy (Vit-D Deficiency, Fractures)  2.  Hyperlipidemia, unspecified hyperlipidemia type Cardiovascular risk and specific lipid/LDL goals reviewed. We discussed several lifestyle modifications today. We will check labs today. Alexa will continue to work on diet, exercise and weight loss efforts. Orders and follow up as documented in patient record.   - Lipid Panel With LDL/HDL Ratio  3. Type 2 diabetes mellitus with other specified complication, without long-term current use of insulin (HCC) We will check labs today. Joyanna agreed to restart Ozempic at 1 mg q weekly, and we will follow up at her next visit. Good blood sugar control is important to decrease the likelihood of diabetic complications such as nephropathy, neuropathy, limb loss, blindness, coronary artery disease, and death. Intensive lifestyle modification including diet, exercise and weight loss are the first line of treatment for diabetes.   - Insulin, random - Hemoglobin A1c - Microalbumin / creatinine urine ratio  4. Essential hypertension Dreonna will continue with healthy weight loss and exercise to improve blood pressure control. We will check labs today, and will follow up as she continues her lifestyle modifications.  - CMP14+EGFR  5. SOB (shortness of breath) on exertion Kemiah's repeat IC today has slightly improved with in creased exercise and weight loss. She is to continue as is. Will continue to monitor closely.  6. Obesity with current BMI 34.8 Geraldine is currently in the action stage of change. As such, her goal is to continue with weight loss efforts. She has agreed to the Category 2 Plan and keeping a food journal and adhering  to recommended goals of 400-550 calories and 40+ grams of protein at supper daily.   Exercise goals: As is.  Behavioral modification strategies: increasing lean protein intake and better snacking choices.  Ninnie has agreed to follow-up with our clinic in 3 weeks. She was informed of the importance of frequent follow-up visits to  maximize her success with intensive lifestyle modifications for her multiple health conditions.   Alysson was informed we would discuss her lab results at her next visit unless there is a critical issue that needs to be addressed sooner. Amiree agreed to keep her next visit at the agreed upon time to discuss these results.  Objective:   Blood pressure 126/75, pulse (!) 55, temperature 97.6 F (36.4 C), height '5\' 2"'  (1.575 m), weight 190 lb (86.2 kg), last menstrual period 10/03/1998, SpO2 96 %. Body mass index is 34.75 kg/m.  General: Cooperative, alert, well developed, in no acute distress. HEENT: Conjunctivae and lids unremarkable. Cardiovascular: Regular rhythm.  Lungs: Normal work of breathing. Neurologic: No focal deficits.   Lab Results  Component Value Date   CREATININE 1.03 (H) 07/12/2021   BUN 23 07/12/2021   NA 136 07/12/2021   K 5.3 (H) 07/12/2021   CL 98 07/12/2021   CO2 21 07/12/2021   Lab Results  Component Value Date   ALT 18 07/12/2021   AST 22 07/12/2021   ALKPHOS 101 07/12/2021   BILITOT 0.8 07/12/2021   Lab Results  Component Value Date   HGBA1C 5.5 07/12/2021   HGBA1C 5.6 10/08/2020   Lab Results  Component Value Date   INSULIN WILL FOLLOW 07/12/2021   INSULIN 23.3 10/08/2020   Lab Results  Component Value Date   TSH 2.270 10/08/2020   Lab Results  Component Value Date   CHOL 242 (H) 07/12/2021   HDL 62 07/12/2021   LDLCALC 158 (H) 07/12/2021   TRIG 125 07/12/2021   Lab Results  Component Value Date   VD25OH 52.1 07/12/2021   VD25OH 66.0 10/08/2020   VD25OH 56 10/08/2013   Lab Results  Component Value Date   WBC 5.3 10/08/2020   HGB 14.0 10/08/2020   HCT 42.4 10/08/2020   MCV 91 10/08/2020   PLT 243 10/08/2020   No results found for: IRON, TIBC, FERRITIN  Obesity Behavioral Intervention:   Approximately 15 minutes were spent on the discussion below.  ASK: We discussed the diagnosis of obesity with Rozanne today and Sheryll agreed  to give [place] permission to discuss obesity behavioral modification therapy today.  ASSESS: Zsofia has the diagnosis of obesity and her BMI today is 34.8. Alishba is in the action stage of change.   ADVISE: Leiliana was educated on the multiple health risks of obesity as well as the benefit of weight loss to improve her health. She was advised of the need for long term treatment and the importance of lifestyle modifications to improve her current health and to decrease her risk of future health problems.  AGREE: Multiple dietary modification options and treatment options were discussed and Lorilee agreed to follow the recommendations documented in the above note.  ARRANGE: Zawadi was educated on the importance of frequent visits to treat obesity as outlined per CMS and USPSTF guidelines and agreed to schedule her next follow up appointment today.  Attestation Statements:   Reviewed by clinician on day of visit: allergies, medications, problem list, medical history, surgical history, family history, social history, and previous encounter notes.   Wilhemena Durie, am acting as transcriptionist for  Dennard Nip, MD.  I have reviewed the above documentation for accuracy and completeness, and I agree with the above. -  Dennard Nip, MD

## 2021-07-19 ENCOUNTER — Telehealth (INDEPENDENT_AMBULATORY_CARE_PROVIDER_SITE_OTHER): Payer: Self-pay

## 2021-07-19 NOTE — Telephone Encounter (Signed)
Review.

## 2021-07-19 NOTE — Telephone Encounter (Signed)
Pt called stating that she needs a refill on her semoglutide 1mg , pt states that she was taking the 2mg  but instead needs the 1mg  called into her pharmacy for a refill.

## 2021-07-20 MED ORDER — OZEMPIC (1 MG/DOSE) 2 MG/1.5ML ~~LOC~~ SOPN: 1.0000 mg | PEN_INJECTOR | SUBCUTANEOUS | 0 refills | Status: DC

## 2021-07-20 NOTE — Telephone Encounter (Signed)
Office notes on 07/12/2021 Started patient was to restart Ozempic 1 mg. But it was not sent into the pharmacy. I have sent the Ozempic 1 mg into her pharamcy

## 2021-07-26 ENCOUNTER — Ambulatory Visit: Payer: Medicare PPO | Admitting: Student

## 2021-07-26 ENCOUNTER — Other Ambulatory Visit: Payer: Self-pay

## 2021-07-26 ENCOUNTER — Encounter: Payer: Self-pay | Admitting: Student

## 2021-07-26 VITALS — BP 137/61 | HR 72 | Temp 98.0°F | Ht 62.0 in | Wt 198.0 lb

## 2021-07-26 DIAGNOSIS — R0602 Shortness of breath: Secondary | ICD-10-CM | POA: Diagnosis not present

## 2021-07-26 DIAGNOSIS — R072 Precordial pain: Secondary | ICD-10-CM

## 2021-07-26 DIAGNOSIS — I480 Paroxysmal atrial fibrillation: Secondary | ICD-10-CM | POA: Diagnosis not present

## 2021-07-26 NOTE — Progress Notes (Signed)
Primary Physician/Referring:  Burnard Bunting, MD  Felicia Franklin ID: Velta Addison, female    DOB: 01-10-1946, 75 y.o.   MRN: [id number]  Chief Complaint  Felicia Franklin presents with   Atrial Fibrillation   Follow-up   Shortness of Breath   HPI:    Felicia Franklin  is a 75 y.o. Caucasian female with history of hypertension and hyperlipidemia and asymptomatic paroxysmal atrial fibrillation and moderate obesity with obstructive sleep apnea and unable to tolerate CPAP. Felicia Franklin is on Eliquis and doing well without signs/symptoms of bleeding.   Felicia Franklin was last seen 09/17/2020 by Dr. Einar Gip and advised for annual follow-up.  Felicia Franklin now presents for urgent visit with concerns of dyspnea and lightheadedness.  Felicia Franklin reports dyspnea on exertion has been slowly worsening over the last several months.  Felicia Franklin has also had occasional brief episodes of lightheadedness while sitting lasting several seconds, which most recently occurred several weeks ago.  Felicia Franklin notably purchased an apple watch 1 week ago and for several days was receiving intermittent atrial fibrillation episodes, however this has not recurred in the last 1 week.  Felicia Franklin does also report leg swelling and weight gain over the last several weeks.  Denies chest pain, palpitations, syncope, near syncope, orthopnea, PND.  Past Medical History:  Diagnosis Date   Anxiety    Anxiety    Arthritis    Back pain    Constipation    Essential hypertension    Hyperlipidemia    Joint pain    Lower back pain    Osteoarthritis    Other fatigue    Paroxysmal atrial fibrillation (HCC)    PONV (postoperative nausea and vomiting)    severe-also can get agitated waking up   Shortness of breath on exertion    Sleep apnea    Wears glasses    Past Surgical History:  Procedure Laterality Date   BUNIONECTOMY     CARPAL TUNNEL RELEASE Left 2007   CARPAL TUNNEL RELEASE Right 1992   CARPOMETACARPAL ([facility]) FUSION OF THUMB Right 11/26/2013   Procedure:  CARPOMETACARPAL ([place]) FUSION OF THUMB;  Surgeon: Cammie Sickle., MD;  Location: [facility];  Service: Orthopedics;  Laterality: Right;   COLONOSCOPY     FOOT SURGERY Left 2005   bunionectomy-osteotomy   HAMMER TOE SURGERY     Family History  Problem Relation Age of Onset   Leukemia Father    Colon cancer Father    Diabetes Father    Prostate cancer Father    Hypertension Father    Heart disease Father    Obesity Father    Breast cancer Mother 80   Hypertension Mother    Anxiety disorder Mother    Cancer Mother    Hyperlipidemia Other    COPD Other    Cancer Other    Obesity Other    Social History   Tobacco Use   Smoking status: Never   Smokeless tobacco: Never  Substance Use Topics   Alcohol use: No    Alcohol/week: 0.0 standard drinks  Marital Status: Divorced   ROS  Review of Systems  Constitutional: Positive for weight gain. Negative for chills, decreased appetite and malaise/fatigue.  Cardiovascular:  Positive for leg swelling. Negative for chest pain, claudication, dyspnea on exertion, near-syncope, orthopnea, palpitations, paroxysmal nocturnal dyspnea and syncope.  Respiratory:  Negative for shortness of breath.   Endocrine: Negative for cold intolerance.  Hematologic/Lymphatic: Does not bruise/bleed easily.  Musculoskeletal:  Positive for back pain (chronic) and muscle  cramps (right abductor tendon tear). Negative for joint swelling.  Gastrointestinal:  Negative for abdominal pain, anorexia, change in bowel habit, hematochezia and melena.  Neurological:  Negative for dizziness, headaches and light-headedness.  Psychiatric/Behavioral:  Negative for depression and substance abuse.   All other systems reviewed and are negative.  Objective  Blood pressure 137/61, pulse 72, temperature 98 F (36.7 C), height '5\' 2"'  (1.575 m), weight 198 lb (89.8 kg), last menstrual period 10/03/1998, SpO2 98 %.  Vitals with BMI 07/26/2021 07/12/2021 06/09/2021   Height '5\' 2"'  '5\' 2"'  '5\' 2"'   Weight 198 lbs 190 lbs 189 lbs  BMI 36.21 85.90 93.11  Systolic 216 244 695  Diastolic 61 75 58  Pulse 72 55 70      Physical Exam Vitals reviewed.  Constitutional:      Comments: Felicia Franklin has short stature, moderately obese and in no acute distress.  HENT:     Head: Normocephalic and atraumatic.  Eyes:     Conjunctiva/sclera: Conjunctivae normal.  Neck:     Thyroid: No thyromegaly.     Vascular: No JVD.  Cardiovascular:     Rate and Rhythm: Normal rate and regular rhythm.     Pulses: Intact distal pulses.     Heart sounds: Normal heart sounds, S1 normal and S2 normal. No murmur heard.   No gallop.     Comments: No JVD. Pulmonary:     Effort: Pulmonary effort is normal. No respiratory distress.     Breath sounds: Normal breath sounds. No wheezing, rhonchi or rales.  Abdominal:     General: Bowel sounds are normal.     Palpations: Abdomen is soft.  Musculoskeletal:        General: Normal range of motion.     Cervical back: Neck supple.     Right lower leg: Edema (1+ pitting) present.     Left lower leg: Edema (1+ pitting) present.  Skin:    General: Skin is warm and dry.  Neurological:     Mental Status: Felicia Franklin is alert.   Laboratory examination:   CMP Latest Ref Rng & Units 07/12/2021 10/08/2020 11/21/2013  Glucose 70 - 99 mg/dL 98 98 91  BUN 8 - 27 mg/dL '23 16 19  ' Creatinine 0.57 - 1.00 mg/dL 1.03(H) 0.82 0.68  Sodium 134 - 144 mmol/L 136 140 143  Potassium 3.5 - 5.2 mmol/L 5.3(H) 4.8 5.0  Chloride 96 - 106 mmol/L 98 101 104  CO2 20 - 29 mmol/L '21 23 24  ' Calcium 8.7 - 10.3 mg/dL 10.0 9.8 10.1  Total Protein 6.0 - 8.5 g/dL 7.1 7.3 -  Total Bilirubin 0.0 - 1.2 mg/dL 0.8 0.8 -  Alkaline Phos 44 - 121 IU/L 101 99 -  AST 0 - 40 IU/L 22 19 -  ALT 0 - 32 IU/L 18 16 -   CBC Latest Ref Rng & Units 10/08/2020 11/26/2013  WBC 3.4 - 10.8 x10E3/uL 5.3 -  Hemoglobin 11.1 - 15.9 g/dL 14.0 14.2  Hematocrit 34.0 - 46.6 % 42.4 -  Platelets 150 - 450 x10E3/uL  243 -   Lipid Panel     Component Value Date/Time   CHOL 242 (H) 07/12/2021 0930   TRIG 125 07/12/2021 0930   HDL 62 07/12/2021 0930   LDLCALC 158 (H) 07/12/2021 0930   HEMOGLOBIN A1C Lab Results  Component Value Date   HGBA1C 5.5 07/12/2021   TSH Recent Labs    10/08/20 0923  TSH 2.270    External labs:  Cholesterol,  total 198.000 m 06/03/2019 HDL 60 MG/DL 06/03/2019 LDL 123.000 m 06/03/2019 Triglycerides 77.000 06/03/2019  Hemoglobin 13.500 g/d 06/03/2019  Creatinine, Serum 0.900 mg/ 06/03/2019  ALT (SGPT) 17.000 uni 06/03/2019  TSH 1.760 06/03/2019  Labs 07/04/2018: Serum glucose 91 mg, BUN 17, creatinine 0.8, eGFR greater than 61, potassium 4.5.  HB 13.0/HCT 40.9, platelets 236, normal indicis.  TSH 1.31, normal.  Total cholesterol 197, triglycerides 85, HDL 66, LDL 114.  Non-HDL cholesterol 131.  Allergies   Allergies  Allergen Reactions   Triple Antibiotic [Bacitracin-Neomycin-Polymyxin] Rash    Medications Prior to Visit:   Outpatient Medications Prior to Visit  Medication Sig Dispense Refill   aliskiren (TEKTURNA) 300 MG tablet Take 300 mg by mouth daily.     amLODipine (NORVASC) 10 MG tablet Take 10 mg by mouth daily.     ascorbic acid (VITAMIN C) 500 MG tablet Take 500 mg by mouth daily.     buPROPion (WELLBUTRIN SR) 150 MG 12 hr tablet TAKE 1 TABLET(150 MG) BY MOUTH DAILY IN THE MORNING 30 tablet 0   Docusate Sodium (COLACE PO) Take by mouth.     ELIQUIS 5 MG TABS tablet TAKE 1 TABLET(5 MG) BY MOUTH TWICE DAILY 180 tablet 1   Multiple Vitamins-Minerals (CENTRUM SILVER PO) Take 1 tablet by mouth daily.     nebivolol (BYSTOLIC) 10 MG tablet Take 10 mg by mouth daily.     Polyethylene Glycol 3350 (MIRALAX PO) Take by mouth daily.      Semaglutide, 1 MG/DOSE, (OZEMPIC, 1 MG/DOSE,) 2 MG/1.5ML SOPN Inject 1 mg into the skin once a week. 1.5 mL 0   simvastatin (ZOCOR) 40 MG tablet Take 40 mg by mouth every evening.     spironolactone (ALDACTONE) 25 MG tablet  TAKE 1 TABLET BY MOUTH EVERY MORNING 90 tablet 2   No facility-administered medications prior to visit.   Final Medications at End of Visit    Current Meds  Medication Sig   aliskiren (TEKTURNA) 300 MG tablet Take 300 mg by mouth daily.   amLODipine (NORVASC) 10 MG tablet Take 10 mg by mouth daily.   ascorbic acid (VITAMIN C) 500 MG tablet Take 500 mg by mouth daily.   buPROPion (WELLBUTRIN SR) 150 MG 12 hr tablet TAKE 1 TABLET(150 MG) BY MOUTH DAILY IN THE MORNING   Docusate Sodium (COLACE PO) Take by mouth.   ELIQUIS 5 MG TABS tablet TAKE 1 TABLET(5 MG) BY MOUTH TWICE DAILY   Multiple Vitamins-Minerals (CENTRUM SILVER PO) Take 1 tablet by mouth daily.   nebivolol (BYSTOLIC) 10 MG tablet Take 10 mg by mouth daily.   Polyethylene Glycol 3350 (MIRALAX PO) Take by mouth daily.    Semaglutide, 1 MG/DOSE, (OZEMPIC, 1 MG/DOSE,) 2 MG/1.5ML SOPN Inject 1 mg into the skin once a week.   simvastatin (ZOCOR) 40 MG tablet Take 40 mg by mouth every evening.   spironolactone (ALDACTONE) 25 MG tablet TAKE 1 TABLET BY MOUTH EVERY MORNING   Radiology:  No results found.  Cardiac Studies:   Echo- 04/04/13:  Study is somewhat technically limited due to Obesity. Left ventricular cavity is normal in size.   Mild concentric hypertrophy.   Normal global wall motion.   Normal systolic global function.   Calculated EF 55%.   Doppler evidence of Grade II (pseudonormal) diastolic dysfunction. Left atrial cavity is mildly dilated.  Lexiscan Myoview stress test 03/29/2013: 1. Resting EKG NSR, Poor R wave progression. Cannot exclude anterior infarct, old. Stress EKG was non diagnostic for  ischemia. No ST-T changes of ischemia noted with pharmacologic stress testing. Stress symptoms included lightheadedness. Stress terminated due to completion of protocol. 2. The perfusion imaging study demonstrated very mild inferior wall breast attenuation artifact( Felicia Franklin scanned sitting).  There was no definite ischemia or scar.   Perfusion improved with stress images.  Dynamic gated images reveal normal wall motion and endocardial thickening. Left ventricular ejection fraction was estimated to be 79%. 3. This represents a low risk study.  EKG  07/26/2021: Sinus rhythm at a rate of 69 bpm.  Left atrial lodgment.  Normal axis.  Nonspecific T wave abnormality.  Low voltage complexes.  Pete EKG 09/17/2020, no significant change.  09/17/2020: Sinus bradycardia at rate of 59 bpm, left atrial enlargement, normal axis.  Low-voltage complexes.  Nonspecific T wave flattening.  Compared to 09/18/2019, no significant change  Assessment     ICD-10-CM   1. Paroxysmal atrial fibrillation (HCC)  I48.0 EKG 12-Lead    2. Shortness of breath  R06.02 PCV ECHOCARDIOGRAM COMPLETE    Basic metabolic panel    Brain natriuretic peptide    CBC    3. Precordial pain  R07.2 CT CORONARY MORPH W/CTA COR W/SCORE W/CA W/CM &/OR WO/CM    CHA2DS2-VASc Score is 2.  Yearly risk of stroke: 2.3% (F, Age).  Score of 1=0.6; 2=2.2; 3=3.2; 4=4.8; 5=7.2; 6=9.8; 7=>9.8) -(CHF; HTN; vasc disease DM,  Female = 1; Age <65 =0; 65-74 = 1,  >75 =2; stroke/embolism= 2).     No orders of the defined types were placed in this encounter.   There are no discontinued medications.   Recommendations:    MICHAL STRZELECKI  is a 75 y.o. Caucasian female with history of hypertension and hyperlipidemia and asymptomatic paroxysmal atrial fibrillation and moderate obesity with obstructive sleep apnea and unable to tolerate CPAP. Felicia Franklin is on Eliquis and doing well without signs/symptoms of bleeding.   Felicia Franklin was last seen 09/17/2020 by Dr. Einar Gip and advised for annual follow-up.  Felicia Franklin now presents for urgent visit with concerns of dyspnea and lightheadedness.  Given worsening dyspnea on exertion and multiple cardiovascular risk factors shared decision was to proceed withFurther ischemic evaluation with coronary CTA as well as repeat echocardiogram.  We will also obtain BNP.  EKG  remains unchanged compared to 09/17/2020, Felicia Franklin presently maintaining sinus rhythm.  Felicia Franklin is tolerating Eliquis without bleeding diathesis.  Blood pressure is relatively well controlled.  Will not make changes to medications at this time.  Continue amlodipine, Eliquis, nebivolol, simvastatin, and spironolactone.  Suspect Felicia Franklin has been going in and out of atrial fibrillation, however Felicia Franklin is tolerating anticoagulation, therefore do not feel changes are recommended at this time as Felicia Franklin is asymptomatic during these episodes.  Given weight gain and edema on exam feel echocardiogram and BNP are indicated at this time.  Follow-up in 6 weeks, sooner if needed, for results of cardiac testing.   Alethia Berthold, PA-C 07/27/2021, 1:55 PM Office: [phone]

## 2021-08-02 ENCOUNTER — Other Ambulatory Visit: Payer: Self-pay

## 2021-08-02 ENCOUNTER — Ambulatory Visit: Payer: Medicare PPO

## 2021-08-02 DIAGNOSIS — R0602 Shortness of breath: Secondary | ICD-10-CM | POA: Diagnosis not present

## 2021-08-04 ENCOUNTER — Ambulatory Visit (INDEPENDENT_AMBULATORY_CARE_PROVIDER_SITE_OTHER): Payer: Medicare PPO | Admitting: Family Medicine

## 2021-08-04 ENCOUNTER — Other Ambulatory Visit: Payer: Self-pay

## 2021-08-04 VITALS — BP 120/71 | HR 66 | Temp 98.1°F | Ht 62.0 in | Wt 192.0 lb

## 2021-08-04 DIAGNOSIS — E1169 Type 2 diabetes mellitus with other specified complication: Secondary | ICD-10-CM | POA: Diagnosis not present

## 2021-08-04 DIAGNOSIS — Z6836 Body mass index (BMI) 36.0-36.9, adult: Secondary | ICD-10-CM

## 2021-08-04 DIAGNOSIS — F3289 Other specified depressive episodes: Secondary | ICD-10-CM | POA: Diagnosis not present

## 2021-08-04 DIAGNOSIS — R0602 Shortness of breath: Secondary | ICD-10-CM

## 2021-08-04 MED ORDER — BUPROPION HCL ER (SR) 150 MG PO TB12: ORAL_TABLET | ORAL | 0 refills | Status: DC

## 2021-08-04 MED ORDER — TIRZEPATIDE 7.5 MG/0.5ML ~~LOC~~ SOAJ: 7.5000 mg | SUBCUTANEOUS | 2 refills | Status: DC

## 2021-08-04 NOTE — Progress Notes (Signed)
Chief Complaint:   OBESITY Felicia Franklin is here to discuss her progress with her obesity treatment plan along with follow-up of her obesity related diagnoses. Felicia Franklin is on the Category 2 Plan and keeping a food journal and adhering to recommended goals of 400-550 calories and 40+ grams of protein at supper daily and states she is following her eating plan approximately (unknown)% of the time. Felicia Franklin states she is doing water aerobics for 45 minutes 5 times per week.  Today's visit was #: 16 Starting weight: 198 lbs Starting date: 10/08/2020 Today's weight: 192 lbs Today's date: 08/04/2021 Total lbs lost to date: 6 Total lbs lost since last in-office visit: 0  Interim History: Felicia Franklin is up 2 lbs but she is retaining some fluid. She has struggled to follow her plan as closely. She is ready to get back on track.  Subjective:   1. Shortness of breath on exertion Felicia Franklin is being followed by Cardiology. She had an echo done recently and she has questions about what the report means.  2. Type 2 diabetes mellitus with other specified complication, without long-term current use of insulin (HCC) Felicia Franklin is on Ozempic, but due to drug shortage she was delayed in starting it.  3. Other depression with emotional eating Felicia Franklin is stable on Wellbutrin, and no side effects were noted.  Assessment/Plan:   1. Shortness of breath on exertion Felicia Franklin was educated on her echo report including EF diastolic dysfunction and value recognition. She is to continue to follow up with Cardiology.  2. Type 2 diabetes mellitus with other specified complication, without long-term current use of insulin (HCC) Felicia Franklin will continue Ozempic but try for a prior authorization for [facility] to see if it is covered by insurance. Felicia Franklin agreed to start Mounjaro 7.5 mg q weekly with no refills. Good blood sugar control is important to decrease the likelihood of diabetic complications such as nephropathy, neuropathy, limb loss, blindness,  coronary artery disease, and death. Intensive lifestyle modification including diet, exercise and weight loss are the first line of treatment for diabetes.   3. Other depression with emotional eating Behavior modification techniques were discussed today to help Felicia Franklin deal with her emotional/non-hunger eating behaviors. We will refill Wellbutrin SR 150 mg q AM #30 for 1 month. Orders and follow up as documented in patient record.   4. Obesity BMI today is 35 Felicia Franklin is currently in the action stage of change. As such, her goal is to continue with weight loss efforts. She has agreed to the Category 2 Plan.   Exercise goals: As is.  Behavioral modification strategies: holiday eating strategies .  Felicia Franklin has agreed to follow-up with our clinic in 2 weeks. She was informed of the importance of frequent follow-up visits to maximize her success with intensive lifestyle modifications for her multiple health conditions.   Objective:   Blood pressure 120/71, pulse 66, temperature 98.1 F (36.7 C), height 5\' 2"  (1.575 m), weight 192 lb (87.1 kg), last menstrual period 10/03/1998, SpO2 98 %. Body mass index is 35.12 kg/m.  General: Cooperative, alert, well developed, in no acute distress. HEENT: Conjunctivae and lids unremarkable. Cardiovascular: Regular rhythm.  Lungs: Normal work of breathing. Neurologic: No focal deficits.   Lab Results  Component Value Date   CREATININE 1.03 (H) 07/12/2021   BUN 23 07/12/2021   NA 136 07/12/2021   K 5.3 (H) 07/12/2021   CL 98 07/12/2021   CO2 21 07/12/2021   Lab Results  Component Value Date  ALT 18 07/12/2021   AST 22 07/12/2021   ALKPHOS 101 07/12/2021   BILITOT 0.8 07/12/2021   Lab Results  Component Value Date   HGBA1C 5.5 07/12/2021   HGBA1C 5.6 10/08/2020   Lab Results  Component Value Date   INSULIN 22.3 07/12/2021   INSULIN 23.3 10/08/2020   Lab Results  Component Value Date   TSH 2.270 10/08/2020   Lab Results  Component Value  Date   CHOL 242 (H) 07/12/2021   HDL 62 07/12/2021   LDLCALC 158 (H) 07/12/2021   TRIG 125 07/12/2021   Lab Results  Component Value Date   VD25OH 52.1 07/12/2021   VD25OH 66.0 10/08/2020   VD25OH 56 10/08/2013   Lab Results  Component Value Date   WBC 5.3 10/08/2020   HGB 14.0 10/08/2020   HCT 42.4 10/08/2020   MCV 91 10/08/2020   PLT 243 10/08/2020   No results found for: IRON, TIBC, FERRITIN  Obesity Behavioral Intervention:   Approximately 15 minutes were spent on the discussion below.  ASK: We discussed the diagnosis of obesity with Felicia Franklin today and Felicia Franklin agreed to give [place] permission to discuss obesity behavioral modification therapy today.  ASSESS: Felicia Franklin has the diagnosis of obesity and her BMI today is 35.1. Felicia Franklin is in the action stage of change.   ADVISE: Felicia Franklin was educated on the multiple health risks of obesity as well as the benefit of weight loss to improve her health. She was advised of the need for long term treatment and the importance of lifestyle modifications to improve her current health and to decrease her risk of future health problems.  AGREE: Multiple dietary modification options and treatment options were discussed and Felicia Franklin agreed to follow the recommendations documented in the above note.  ARRANGE: Felicia Franklin was educated on the importance of frequent visits to treat obesity as outlined per CMS and USPSTF guidelines and agreed to schedule her next follow up appointment today.  Attestation Statements:   Reviewed by clinician on day of visit: allergies, medications, problem list, medical history, surgical history, family history, social history, and previous encounter notes.   I, Trixie Dredge, am acting as transcriptionist for Dennard Nip, MD.  I have reviewed the above documentation for accuracy and completeness, and I agree with the above. -  Dennard Nip, MD

## 2021-08-06 DIAGNOSIS — R0602 Shortness of breath: Secondary | ICD-10-CM | POA: Diagnosis not present

## 2021-08-07 LAB — CBC
Hematocrit: 42.2 % (ref 34.0–46.6)
Hemoglobin: 14.2 g/dL (ref 11.1–15.9)
MCH: 30.6 pg (ref 26.6–33.0)
MCHC: 33.6 g/dL (ref 31.5–35.7)
MCV: 91 fL (ref 79–97)
Platelets: 303 10*3/uL (ref 150–450)
RBC: 4.64 x10E6/uL (ref 3.77–5.28)
RDW: 13.3 % (ref 11.7–15.4)
WBC: 7.1 10*3/uL (ref 3.4–10.8)

## 2021-08-07 LAB — BASIC METABOLIC PANEL
BUN/Creatinine Ratio: 22 (ref 12–28)
BUN: 21 mg/dL (ref 8–27)
CO2: 23 mmol/L (ref 20–29)
Calcium: 10 mg/dL (ref 8.7–10.3)
Chloride: 100 mmol/L (ref 96–106)
Creatinine, Ser: 0.95 mg/dL (ref 0.57–1.00)
Glucose: 89 mg/dL (ref 70–99)
Potassium: 4.9 mmol/L (ref 3.5–5.2)
Sodium: 138 mmol/L (ref 134–144)
eGFR: 63 mL/min/{1.73_m2} (ref >59)

## 2021-08-07 LAB — BRAIN NATRIURETIC PEPTIDE: BNP: 19.2 pg/mL (ref 0.0–100.0)

## 2021-08-10 ENCOUNTER — Telehealth (HOSPITAL_COMMUNITY): Payer: Self-pay | Admitting: Emergency Medicine

## 2021-08-10 NOTE — Telephone Encounter (Signed)
Pt returning phone call regarding upcoming cardiac imaging study; pt verbalizes understanding of appt date/time, parking situation and where to check in, pre-test NPO status and medications ordered, and verified current allergies; name and call back number provided for further questions should they arise Rockwell Alexandria RN Navigator Cardiac Imaging [facility] [phone] office [phone] cell  Difficult IV Daily meds , holding spironolactone

## 2021-08-10 NOTE — Telephone Encounter (Signed)
Attempted to call patient regarding upcoming cardiac CT appointment. °Left message on voicemail with name and callback number °Johnie Stadel RN Navigator Cardiac Imaging °[facility] °[phone] Office °[phone] Cell ° °

## 2021-08-11 ENCOUNTER — Encounter (HOSPITAL_COMMUNITY): Payer: Self-pay

## 2021-08-11 ENCOUNTER — Other Ambulatory Visit: Payer: Self-pay

## 2021-08-11 ENCOUNTER — Ambulatory Visit (HOSPITAL_COMMUNITY)
Admission: RE | Admit: 2021-08-11 | Discharge: 2021-08-11 | Disposition: A | Discharge status: Home / Self Care | Payer: Medicare PPO | Source: Ambulatory Visit | Attending: Student | Admitting: Student

## 2021-08-11 DIAGNOSIS — R072 Precordial pain: Secondary | ICD-10-CM | POA: Insufficient documentation

## 2021-08-11 MED ORDER — METOPROLOL TARTRATE 5 MG/5ML IV SOLN
5.0000 mg | INTRAVENOUS | Status: DC | PRN
  Administered 2021-08-11: 10 mg via INTRAVENOUS

## 2021-08-11 MED ORDER — DILTIAZEM HCL 25 MG/5ML IV SOLN: 5.0000 mg | INTRAVENOUS | Status: DC | PRN

## 2021-08-11 MED ORDER — NITROGLYCERIN 0.4 MG SL SUBL: 0.8000 mg | SUBLINGUAL_TABLET | Freq: Once | SUBLINGUAL | Status: DC

## 2021-08-11 MED ORDER — DILTIAZEM HCL 25 MG/5ML IV SOLN
INTRAVENOUS | Status: AC
  Filled 2021-08-11: qty 5

## 2021-08-11 MED ORDER — METOPROLOL TARTRATE 5 MG/5ML IV SOLN
INTRAVENOUS | Status: AC
  Filled 2021-08-11: qty 10

## 2021-08-11 MED ORDER — NITROGLYCERIN 0.4 MG SL SUBL
SUBLINGUAL_TABLET | SUBLINGUAL | Status: AC
  Filled 2021-08-11: qty 2

## 2021-08-11 MED ORDER — METOPROLOL TARTRATE 5 MG/5ML IV SOLN
INTRAVENOUS | Status: AC
  Administered 2021-08-11: 5 mg via INTRAVENOUS
  Filled 2021-08-11: qty 10

## 2021-08-11 NOTE — Progress Notes (Incomplete)
Pt now in a-fib a-flutter

## 2021-08-11 NOTE — Progress Notes (Signed)
Pt now in a-fib/flutter with HR ranging in the 60-100s. 15 mg of metoprolol already given. Dr. Odis Hollingshead notified. Per MD, abort procedure at this time. [place] Dec CT navigator informed.

## 2021-08-13 ENCOUNTER — Other Ambulatory Visit: Payer: Self-pay | Admitting: Student

## 2021-08-13 DIAGNOSIS — R0602 Shortness of breath: Secondary | ICD-10-CM

## 2021-08-13 DIAGNOSIS — R072 Precordial pain: Secondary | ICD-10-CM

## 2021-08-13 NOTE — Progress Notes (Signed)
Was unable to do coronary CTA as she was in atrial fibrillation and could not control her heart rate.  Patient is not a treadmill candidate due to significant dyspnea on exertion lightheadedness with exercise.

## 2021-08-17 ENCOUNTER — Ambulatory Visit (INDEPENDENT_AMBULATORY_CARE_PROVIDER_SITE_OTHER): Payer: Medicare PPO | Admitting: Family Medicine

## 2021-08-17 ENCOUNTER — Other Ambulatory Visit: Payer: Self-pay

## 2021-08-17 ENCOUNTER — Encounter (INDEPENDENT_AMBULATORY_CARE_PROVIDER_SITE_OTHER): Payer: Self-pay | Admitting: Family Medicine

## 2021-08-17 VITALS — BP 116/73 | HR 62 | Temp 97.5°F | Ht 62.0 in | Wt 190.0 lb

## 2021-08-17 DIAGNOSIS — F3289 Other specified depressive episodes: Secondary | ICD-10-CM

## 2021-08-17 DIAGNOSIS — Z6836 Body mass index (BMI) 36.0-36.9, adult: Secondary | ICD-10-CM

## 2021-08-17 DIAGNOSIS — E1169 Type 2 diabetes mellitus with other specified complication: Secondary | ICD-10-CM

## 2021-08-17 MED ORDER — BUPROPION HCL ER (SR) 150 MG PO TB12: 150.0000 mg | ORAL_TABLET | Freq: Two times a day (BID) | ORAL | 0 refills | Status: DC

## 2021-08-17 MED ORDER — TIRZEPATIDE 7.5 MG/0.5ML ~~LOC~~ SOAJ: 7.5000 mg | SUBCUTANEOUS | 2 refills | Status: DC

## 2021-08-17 NOTE — Progress Notes (Signed)
Chief Complaint:   OBESITY Felicia Franklin is here to discuss her progress with her obesity treatment plan along with follow-up of her obesity related diagnoses. Elon is on the Category 2 Plan and states she is following her eating plan approximately ?% of the time. Felicia Franklin states she is doing water aerobics 110 minutes 5 times per week.  Today's visit was #: 17 Starting weight: 198 lbs Starting date: 10/08/2020 Today's weight: 190 lbs Today's date: 08/17/2021 Total lbs lost to date: 8 Total lbs lost since last in-office visit: 2  Interim History: Felicia Franklin is a pt of Dr. Dalbert Garnet. This is her first visit with me. Felicia Franklin is here for a follow up office visit.  We reviewed her meal plan and questions were answered.  Patient's food recall appears to be accurate and consistent with what is on plan when she is following it.   When eating on plan, her hunger and cravings are well controlled.    Subjective:   1. Type 2 diabetes mellitus with other specified complication, without long-term current use of insulin (HCC) Two weeks ago, Ozempic was discussed and pt started [facility]. She is tolerating it well and notes it's helping more with hunger.  2. Other depression with emotional eating Felicia Franklin has had an increase in eating at night, especially driven by emotions. Pt has been having a lot more emotional eating lately, due to her worrying about chronic Afib. Pt has no history of seizures.  Assessment/Plan:  No orders of the defined types were placed in this encounter.   Medications Discontinued During This Encounter  Medication Reason   Semaglutide, 1 MG/DOSE, (OZEMPIC, 1 MG/DOSE,) 2 MG/1.5ML SOPN Change in therapy   buPROPion (WELLBUTRIN SR) 150 MG 12 hr tablet Reorder   tirzepatide (MOUNJARO) 7.5 MG/0.5ML Pen Reorder     Meds ordered this encounter  Medications   buPROPion (WELLBUTRIN SR) 150 MG 12 hr tablet    Sig: Take 1 tablet (150 mg total) by mouth 2 (two) times daily.    Dispense:   60 tablet    Refill:  0   tirzepatide (MOUNJARO) 7.5 MG/0.5ML Pen    Sig: Inject 7.5 mg into the skin once a week.    Dispense:  6 mL    Refill:  2     1. Type 2 diabetes mellitus with other specified complication, without long-term current use of insulin (HCC) Good blood sugar control is important to decrease the likelihood of diabetic complications such as nephropathy, neuropathy, limb loss, blindness, coronary artery disease, and death. Intensive lifestyle modification including diet, exercise and weight loss are the first line of treatment for diabetes.   Refill- tirzepatide (MOUNJARO) 7.5 MG/0.5ML Pen; Inject 7.5 mg into the skin once a week.  Dispense: 6 mL; Refill: 2  2. Other depression with emotional eating Felicia Franklin will increase Wellbutrin from QD to BID. Behavior modification techniques were discussed today to help Felicia Franklin deal with her emotional/non-hunger eating behaviors.  Orders and follow up as documented in patient record.   Increase & Refill- buPROPion (WELLBUTRIN SR) 150 MG 12 hr tablet; Take 1 tablet (150 mg total) by mouth 2 (two) times daily.  Dispense: 60 tablet; Refill: 0  3. Obesity with current BMI of 34.8  Felicia Franklin is currently in the action stage of change. As such, her goal is to continue with weight loss efforts. She has agreed to the Category 2 Plan.   Exercise goals:  As is  Behavioral modification strategies: increasing lean protein  intake and planning for success.  Felicia Franklin has agreed to follow-up with our clinic in 2-3 weeks. She was informed of the importance of frequent follow-up visits to maximize her success with intensive lifestyle modifications for her multiple health conditions.   Objective:   Blood pressure 116/73, pulse 62, temperature (!) 97.5 F (36.4 C), height 5\' 2"  (1.575 m), weight 190 lb (86.2 kg), last menstrual period 10/03/1998, SpO2 98 %. Body mass index is 34.75 kg/m.  General: Cooperative, alert, well developed, in no acute  distress. HEENT: Conjunctivae and lids unremarkable. Cardiovascular: Regular rhythm.  Lungs: Normal work of breathing. Neurologic: No focal deficits.   Lab Results  Component Value Date   CREATININE 0.95 08/06/2021   BUN 21 08/06/2021   NA 138 08/06/2021   K 4.9 08/06/2021   CL 100 08/06/2021   CO2 23 08/06/2021   Lab Results  Component Value Date   ALT 18 07/12/2021   AST 22 07/12/2021   ALKPHOS 101 07/12/2021   BILITOT 0.8 07/12/2021   Lab Results  Component Value Date   HGBA1C 5.5 07/12/2021   HGBA1C 5.6 10/08/2020   Lab Results  Component Value Date   INSULIN 22.3 07/12/2021   INSULIN 23.3 10/08/2020   Lab Results  Component Value Date   TSH 2.270 10/08/2020   Lab Results  Component Value Date   CHOL 242 (H) 07/12/2021   HDL 62 07/12/2021   LDLCALC 158 (H) 07/12/2021   TRIG 125 07/12/2021   Lab Results  Component Value Date   VD25OH 52.1 07/12/2021   VD25OH 66.0 10/08/2020   VD25OH 56 10/08/2013   Lab Results  Component Value Date   WBC 7.1 08/06/2021   HGB 14.2 08/06/2021   HCT 42.2 08/06/2021   MCV 91 08/06/2021   PLT 303 08/06/2021   No results found for: IRON, TIBC, FERRITIN  Obesity Behavioral Intervention:   Approximately 15 minutes were spent on the discussion below.  ASK: We discussed the diagnosis of obesity with Felicia Franklin today and Felicia Franklin agreed to give Felicia Franklin permission to discuss obesity behavioral modification therapy today.  ASSESS: Felicia Franklin has the diagnosis of obesity and her BMI today is 34.8. Felicia Franklin is in the action stage of change.   ADVISE: Felicia Franklin was educated on the multiple health risks of obesity as well as the benefit of weight loss to improve her health. She was advised of the need for long term treatment and the importance of lifestyle modifications to improve her current health and to decrease her risk of future health problems.  AGREE: Multiple dietary modification options and treatment options were discussed and Felicia Franklin agreed  to follow the recommendations documented in the above note.  ARRANGE: Felicia Franklin was educated on the importance of frequent visits to treat obesity as outlined per CMS and USPSTF guidelines and agreed to schedule her next follow up appointment today.  Attestation Statements:   Reviewed by clinician on day of visit: allergies, medications, problem list, medical history, surgical history, family history, social history, and previous encounter notes.  Earlean Shawl, CMA, am acting as transcriptionist for Edmund Hilda, DO.  I have reviewed the above documentation for accuracy and completeness, and I agree with the above. [employer], D.O.  The 21st Century Cures Act was signed into law in 2016 which includes the topic of electronic health records.  This provides immediate access to information in MyChart.  This includes consultation notes, operative notes, office notes, lab results and pathology reports.  If you  have any questions about what you read please let us know at your next visit so we can discuss your concerns and take corrective action if need be.  We are right here with you.

## 2021-08-29 DIAGNOSIS — Z20822 Contact with and (suspected) exposure to covid-19: Secondary | ICD-10-CM | POA: Diagnosis not present

## 2021-08-30 ENCOUNTER — Other Ambulatory Visit: Payer: Medicare PPO

## 2021-08-31 ENCOUNTER — Other Ambulatory Visit (INDEPENDENT_AMBULATORY_CARE_PROVIDER_SITE_OTHER): Payer: Self-pay | Admitting: Family Medicine

## 2021-08-31 DIAGNOSIS — R7303 Prediabetes: Secondary | ICD-10-CM

## 2021-08-31 NOTE — Telephone Encounter (Signed)
LOV with Dr. O

## 2021-09-02 ENCOUNTER — Other Ambulatory Visit: Payer: Self-pay

## 2021-09-02 ENCOUNTER — Encounter (INDEPENDENT_AMBULATORY_CARE_PROVIDER_SITE_OTHER): Payer: Self-pay | Admitting: Family Medicine

## 2021-09-02 ENCOUNTER — Ambulatory Visit (INDEPENDENT_AMBULATORY_CARE_PROVIDER_SITE_OTHER): Payer: Medicare PPO | Admitting: Family Medicine

## 2021-09-02 VITALS — BP 122/73 | HR 75 | Temp 97.7°F | Ht 62.0 in | Wt 188.0 lb

## 2021-09-02 DIAGNOSIS — Z6836 Body mass index (BMI) 36.0-36.9, adult: Secondary | ICD-10-CM

## 2021-09-02 DIAGNOSIS — E1169 Type 2 diabetes mellitus with other specified complication: Secondary | ICD-10-CM | POA: Diagnosis not present

## 2021-09-02 DIAGNOSIS — F3289 Other specified depressive episodes: Secondary | ICD-10-CM

## 2021-09-02 MED ORDER — BUPROPION HCL ER (SR) 150 MG PO TB12
150.0000 mg | ORAL_TABLET | Freq: Two times a day (BID) | ORAL | 0 refills | Status: DC
Start: 2021-09-02 — End: 2022-01-12

## 2021-09-02 NOTE — Progress Notes (Signed)
Chief Complaint:   OBESITY Felicia Franklin is here to discuss her progress with her obesity treatment plan along with follow-up of her obesity related diagnoses. Felicia Franklin is on the Category 2 Plan and states she is following her eating plan approximately 30% of the time. Felicia Franklin states she is doing water aerobics and classes for 1 hour and 45 minutes 5 times per week.  Today's visit was #: 18 Starting weight: 198 lbs Starting date: 10/08/2020 Today's weight: 188 lbs Today's date: 09/02/2021 Total lbs lost to date: 10 Total lbs lost since last in-office visit: 2  Interim History: Felicia Franklin has done well with avoiding holiday weight gain over Thanksgiving. She is working on Music therapist, and increasing protein and vegetables.  Subjective:   1. Type 2 diabetes mellitus with other specified complication, without long-term current use of insulin (HCC) Felicia Franklin is doing well on Mounjaro. She notes decreased polyphagia, and she is doing well with diet and exercise.  2. Other depression with emotional eating Felicia Franklin is doing well with minimizing emotional eating behaviors. She had her Wellbutrin increased to BID from Dr. Sharee Holster, but she is forgetting to take her second dose frequently.  Assessment/Plan:   1. Type 2 diabetes mellitus with other specified complication, without long-term current use of insulin (HCC) Felicia Franklin will continue Mounjaro at 7.5 mg and will continue to follow up as directed. Good blood sugar control is important to decrease the likelihood of diabetic complications such as nephropathy, neuropathy, limb loss, blindness, coronary artery disease, and death. Intensive lifestyle modification including diet, exercise and weight loss are the first line of treatment for diabetes.   2. Other depression with emotional eating Felicia Franklin continue Wellbutrin SR at 150 mg BID #60, and we will refill for 1 month. She is to set an alarm on her phone to help her remember her second  dose. Orders and follow up as documented in patient record.   3. Obesity with current BMI 34.5 Felicia Franklin is currently in the action stage of change. As such, her goal is to continue with weight loss efforts. She has agreed to the Category 2 Plan.   Exercise goals: As is.  Behavioral modification strategies: holiday eating strategies .  Felicia Franklin has agreed to follow-up with our clinic in 2 to 3 weeks. She was informed of the importance of frequent follow-up visits to maximize her success with intensive lifestyle modifications for her multiple health conditions.   Objective:   Blood pressure 122/73, pulse 75, temperature 97.7 F (36.5 C), height 5\' 2"  (1.575 m), weight 188 lb (85.3 kg), last menstrual period 10/03/1998, SpO2 98 %. Body mass index is 34.39 kg/m.  General: Cooperative, alert, well developed, in no acute distress. HEENT: Conjunctivae and lids unremarkable. Cardiovascular: Regular rhythm.  Lungs: Normal work of breathing. Neurologic: No focal deficits.   Lab Results  Component Value Date   CREATININE 0.95 08/06/2021   BUN 21 08/06/2021   NA 138 08/06/2021   K 4.9 08/06/2021   CL 100 08/06/2021   CO2 23 08/06/2021   Lab Results  Component Value Date   ALT 18 07/12/2021   AST 22 07/12/2021   ALKPHOS 101 07/12/2021   BILITOT 0.8 07/12/2021   Lab Results  Component Value Date   HGBA1C 5.5 07/12/2021   HGBA1C 5.6 10/08/2020   Lab Results  Component Value Date   INSULIN 22.3 07/12/2021   INSULIN 23.3 10/08/2020   Lab Results  Component Value Date   TSH 2.270 10/08/2020  Lab Results  Component Value Date   CHOL 242 (H) 07/12/2021   HDL 62 07/12/2021   LDLCALC 158 (H) 07/12/2021   TRIG 125 07/12/2021   Lab Results  Component Value Date   VD25OH 52.1 07/12/2021   VD25OH 66.0 10/08/2020   VD25OH 56 10/08/2013   Lab Results  Component Value Date   WBC 7.1 08/06/2021   HGB 14.2 08/06/2021   HCT 42.2 08/06/2021   MCV 91 08/06/2021   PLT 303 08/06/2021    No results found for: IRON, TIBC, FERRITIN  Obesity Behavioral Intervention:   Approximately 15 minutes were spent on the discussion below.  ASK: We discussed the diagnosis of obesity with Hollyanne today and Netra agreed to give [place] permission to discuss obesity behavioral modification therapy today.  ASSESS: Nashika has the diagnosis of obesity and her BMI today is 34.5. Namya is in the action stage of change.   ADVISE: Annaliesa was educated on the multiple health risks of obesity as well as the benefit of weight loss to improve her health. She was advised of the need for long term treatment and the importance of lifestyle modifications to improve her current health and to decrease her risk of future health problems.  AGREE: Multiple dietary modification options and treatment options were discussed and Fallyn agreed to follow the recommendations documented in the above note.  ARRANGE: Sahily was educated on the importance of frequent visits to treat obesity as outlined per CMS and USPSTF guidelines and agreed to schedule her next follow up appointment today.  Attestation Statements:   Reviewed by clinician on day of visit: allergies, medications, problem list, medical history, surgical history, family history, social history, and previous encounter notes.   I, Burt Knack, am acting as transcriptionist for Quillian Quince, MD.  I have reviewed the above documentation for accuracy and completeness, and I agree with the above. -  Quillian Quince, MD

## 2021-09-06 ENCOUNTER — Ambulatory Visit: Payer: Medicare PPO | Admitting: Student

## 2021-09-13 ENCOUNTER — Other Ambulatory Visit: Payer: Medicare PPO

## 2021-09-15 DIAGNOSIS — E785 Hyperlipidemia, unspecified: Secondary | ICD-10-CM | POA: Diagnosis not present

## 2021-09-16 ENCOUNTER — Ambulatory Visit (INDEPENDENT_AMBULATORY_CARE_PROVIDER_SITE_OTHER): Payer: Medicare PPO | Admitting: Family Medicine

## 2021-09-17 ENCOUNTER — Ambulatory Visit: Payer: Medicare PPO | Admitting: Student

## 2021-09-20 ENCOUNTER — Ambulatory Visit: Payer: Medicare PPO | Admitting: Cardiology

## 2021-09-22 ENCOUNTER — Ambulatory Visit: Payer: Medicare PPO

## 2021-09-22 ENCOUNTER — Other Ambulatory Visit: Payer: Self-pay

## 2021-09-22 DIAGNOSIS — Z0000 Encounter for general adult medical examination without abnormal findings: Secondary | ICD-10-CM | POA: Diagnosis not present

## 2021-09-22 DIAGNOSIS — K219 Gastro-esophageal reflux disease without esophagitis: Secondary | ICD-10-CM | POA: Diagnosis not present

## 2021-09-22 DIAGNOSIS — I480 Paroxysmal atrial fibrillation: Secondary | ICD-10-CM | POA: Diagnosis not present

## 2021-09-22 DIAGNOSIS — R072 Precordial pain: Secondary | ICD-10-CM | POA: Diagnosis not present

## 2021-09-22 DIAGNOSIS — Z1339 Encounter for screening examination for other mental health and behavioral disorders: Secondary | ICD-10-CM | POA: Diagnosis not present

## 2021-09-22 DIAGNOSIS — E669 Obesity, unspecified: Secondary | ICD-10-CM | POA: Diagnosis not present

## 2021-09-22 DIAGNOSIS — R0602 Shortness of breath: Secondary | ICD-10-CM | POA: Diagnosis not present

## 2021-09-22 DIAGNOSIS — I872 Venous insufficiency (chronic) (peripheral): Secondary | ICD-10-CM | POA: Diagnosis not present

## 2021-09-22 DIAGNOSIS — N3281 Overactive bladder: Secondary | ICD-10-CM | POA: Diagnosis not present

## 2021-09-22 DIAGNOSIS — E785 Hyperlipidemia, unspecified: Secondary | ICD-10-CM | POA: Diagnosis not present

## 2021-09-22 DIAGNOSIS — I10 Essential (primary) hypertension: Secondary | ICD-10-CM | POA: Diagnosis not present

## 2021-09-22 DIAGNOSIS — Z1331 Encounter for screening for depression: Secondary | ICD-10-CM | POA: Diagnosis not present

## 2021-09-23 ENCOUNTER — Encounter (INDEPENDENT_AMBULATORY_CARE_PROVIDER_SITE_OTHER): Payer: Self-pay | Admitting: Family Medicine

## 2021-09-23 ENCOUNTER — Ambulatory Visit (INDEPENDENT_AMBULATORY_CARE_PROVIDER_SITE_OTHER): Payer: Medicare PPO | Admitting: Family Medicine

## 2021-09-23 VITALS — BP 102/64 | HR 74 | Temp 98.4°F | Ht 62.0 in | Wt 188.0 lb

## 2021-09-23 DIAGNOSIS — Z6836 Body mass index (BMI) 36.0-36.9, adult: Secondary | ICD-10-CM | POA: Diagnosis not present

## 2021-09-23 DIAGNOSIS — E1169 Type 2 diabetes mellitus with other specified complication: Secondary | ICD-10-CM

## 2021-09-23 NOTE — Progress Notes (Signed)
Chief Complaint:   OBESITY Tiahna is here to discuss her progress with her obesity treatment plan along with follow-up of her obesity related diagnoses. Aletheia is on the Category 2 Plan and states she is following her eating plan approximately 40% of the time. Merrie states she is doing water exercise for 60-90 minutes 5 times per week.  Today's visit was #: 65 Starting weight: 198 lbs Starting date: 10/08/2020 Today's weight: 188 lbs Today's date: 09/23/2021 Total lbs lost to date: 10 Total lbs lost since last in-office visit: 0  Interim History: Angela Nevin has done well with maintaining her weight even with extra challenges over the holidays. She is doing well with portion controlling sweets and trying to increase her protein.  Subjective:   1. Type 2 diabetes mellitus with other specified complication, without long-term current use of insulin (HCC) Tersa is stable on Mounjaro, and she is doing well with diet and weight loss. No side effects were noted.  Assessment/Plan:   1. Type 2 diabetes mellitus with other specified complication, without long-term current use of insulin (HCC) Zakari will continue Mounjaro, diet, and exercise. We will recheck labs in 1-2 months. Good blood sugar control is important to decrease the likelihood of diabetic complications such as nephropathy, neuropathy, limb loss, blindness, coronary artery disease, and death. Intensive lifestyle modification including diet, exercise and weight loss are the first line of treatment for diabetes.   2. Obesity BMI today is 44 Wyolene is currently in the action stage of change. As such, her goal is to continue with weight loss efforts. She has agreed to the Category 2 Plan.   Exercise goals: As is.  Behavioral modification strategies: increasing lean protein intake, meal planning and cooking strategies, and holiday eating strategies .  Alnita has agreed to follow-up with our clinic in 3 to 4 weeks. She was informed of the  importance of frequent follow-up visits to maximize her success with intensive lifestyle modifications for her multiple health conditions.   Objective:   Blood pressure 102/64, pulse 74, temperature 98.4 F (36.9 C), height 5\' 2"  (1.575 m), weight 188 lb (85.3 kg), last menstrual period 10/03/1998, SpO2 99 %. Body mass index is 34.39 kg/m.  General: Cooperative, alert, well developed, in no acute distress. HEENT: Conjunctivae and lids unremarkable. Cardiovascular: Regular rhythm.  Lungs: Normal work of breathing. Neurologic: No focal deficits.   Lab Results  Component Value Date   CREATININE 0.95 08/06/2021   BUN 21 08/06/2021   NA 138 08/06/2021   K 4.9 08/06/2021   CL 100 08/06/2021   CO2 23 08/06/2021   Lab Results  Component Value Date   ALT 18 07/12/2021   AST 22 07/12/2021   ALKPHOS 101 07/12/2021   BILITOT 0.8 07/12/2021   Lab Results  Component Value Date   HGBA1C 5.5 07/12/2021   HGBA1C 5.6 10/08/2020   Lab Results  Component Value Date   INSULIN 22.3 07/12/2021   INSULIN 23.3 10/08/2020   Lab Results  Component Value Date   TSH 2.270 10/08/2020   Lab Results  Component Value Date   CHOL 242 (H) 07/12/2021   HDL 62 07/12/2021   LDLCALC 158 (H) 07/12/2021   TRIG 125 07/12/2021   Lab Results  Component Value Date   VD25OH 52.1 07/12/2021   VD25OH 66.0 10/08/2020   VD25OH 56 10/08/2013   Lab Results  Component Value Date   WBC 7.1 08/06/2021   HGB 14.2 08/06/2021   HCT 42.2 08/06/2021  MCV 91 08/06/2021   PLT 303 08/06/2021   No results found for: IRON, TIBC, FERRITIN  Obesity Behavioral Intervention:   Approximately 15 minutes were spent on the discussion below.  ASK: We discussed the diagnosis of obesity with Satine today and Madelline agreed to give [place] permission to discuss obesity behavioral modification therapy today.  ASSESS: Florella has the diagnosis of obesity and her BMI today is 34.5. Syd is in the action stage of change.    ADVISE: Darnetta was educated on the multiple health risks of obesity as well as the benefit of weight loss to improve her health. She was advised of the need for long term treatment and the importance of lifestyle modifications to improve her current health and to decrease her risk of future health problems.  AGREE: Multiple dietary modification options and treatment options were discussed and Kynnedi agreed to follow the recommendations documented in the above note.  ARRANGE: Genae was educated on the importance of frequent visits to treat obesity as outlined per CMS and USPSTF guidelines and agreed to schedule her next follow up appointment today.  Attestation Statements:   Reviewed by clinician on day of visit: allergies, medications, problem list, medical history, surgical history, family history, social history, and previous encounter notes.   I, Burt Knack, am acting as transcriptionist for Quillian Quince, MD.  I have reviewed the above documentation for accuracy and completeness, and I agree with the above. -  Quillian Quince, MD

## 2021-10-06 ENCOUNTER — Ambulatory Visit: Payer: Medicare PPO | Admitting: Student

## 2021-10-12 ENCOUNTER — Encounter: Payer: Self-pay | Admitting: Student

## 2021-10-12 ENCOUNTER — Ambulatory Visit: Payer: Medicare PPO | Admitting: Student

## 2021-10-12 ENCOUNTER — Other Ambulatory Visit: Payer: Self-pay

## 2021-10-12 VITALS — BP 133/66 | HR 81 | Temp 98.0°F | Resp 16 | Ht 62.0 in | Wt 193.0 lb

## 2021-10-12 DIAGNOSIS — I480 Paroxysmal atrial fibrillation: Secondary | ICD-10-CM | POA: Diagnosis not present

## 2021-10-12 DIAGNOSIS — I10 Essential (primary) hypertension: Secondary | ICD-10-CM | POA: Diagnosis not present

## 2021-10-12 DIAGNOSIS — R072 Precordial pain: Secondary | ICD-10-CM

## 2021-10-12 DIAGNOSIS — R0602 Shortness of breath: Secondary | ICD-10-CM | POA: Diagnosis not present

## 2021-10-12 NOTE — Progress Notes (Signed)
Primary Physician/Referring:  Burnard Bunting, MD  Patient ID: Felicia Franklin, female    DOB: 06-Aug-1946, 76 y.o.   MRN: [id number]  Chief Complaint  Patient presents with   Atrial Fibrillation   Follow-up    6 weeks   HPI:    Felicia Franklin  is a 75 y.o. Caucasian female with history of hypertension and hyperlipidemia and asymptomatic paroxysmal atrial fibrillation and moderate obesity with obstructive sleep apnea and unable to tolerate CPAP. She is on Eliquis and doing well without signs/symptoms of bleeding.   Patient presents for follow-up.  She was seen 07/26/2021 with worsening dyspnea on exertion, therefore advised further cardiac evaluation with echocardiogram and stress test.  Echocardiogram revealed normal LVEF with mildly dilated LA and mild to moderate MR.  Previous echocardiogram in 2014 noted grade 2 diastolic dysfunction and trace MR.  Stress test was overall low risk.   Patient continues to do water aerobics 5 days/week without issue.  However she does report continued dyspnea on exertion when walking.  Reports she feels her legs get weak and she gets short of breath, but does not have these issues during exercise classes.  Last office visit patient admitted to recent weight gain, BNP, BMP, and CBC were within normal limits.  Past Medical History:  Diagnosis Date   Anxiety    Anxiety    Arthritis    Back pain    Constipation    Essential hypertension    Hyperlipidemia    Joint pain    Lower back pain    Osteoarthritis    Other fatigue    Paroxysmal atrial fibrillation (HCC)    PONV (postoperative nausea and vomiting)    severe-also can get agitated waking up   Shortness of breath on exertion    Sleep apnea    Wears glasses    Past Surgical History:  Procedure Laterality Date   BUNIONECTOMY     CARPAL TUNNEL RELEASE Left 2007   CARPAL TUNNEL RELEASE Right 1992   CARPOMETACARPAL ([facility]) FUSION OF THUMB Right 11/26/2013   Procedure: CARPOMETACARPAL ([facility])  FUSION OF THUMB;  Surgeon: Cammie Sickle., MD;  Location: [facility];  Service: Orthopedics;  Laterality: Right;   COLONOSCOPY     FOOT SURGERY Left 2005   bunionectomy-osteotomy   HAMMER TOE SURGERY     Family History  Problem Relation Age of Onset   Leukemia Father    Colon cancer Father    Diabetes Father    Prostate cancer Father    Hypertension Father    Heart disease Father    Obesity Father    Breast cancer Mother 78   Hypertension Mother    Anxiety disorder Mother    Cancer Mother    Hyperlipidemia Other    COPD Other    Cancer Other    Obesity Other    Social History   Tobacco Use   Smoking status: Never   Smokeless tobacco: Never  Substance Use Topics   Alcohol use: No    Alcohol/week: 0.0 standard drinks  Marital Status: Divorced   ROS  Review of Systems  Constitutional: Positive for weight gain. Negative for malaise/fatigue.  Cardiovascular:  Positive for dyspnea on exertion. Negative for chest pain, claudication, leg swelling, near-syncope, orthopnea, palpitations, paroxysmal nocturnal dyspnea and syncope.  Neurological:  Negative for dizziness.   Objective  Blood pressure 133/66, pulse 81, temperature 98 F (36.7 C), resp. rate 16, height '5\' 2"'  (1.575 m), weight 193 lb (  87.5 kg), last menstrual period 10/03/1998, SpO2 98 %.  Vitals with BMI 10/12/2021 09/23/2021 09/02/2021  Height '5\' 2"'  '5\' 2"'  '5\' 2"'   Weight 193 lbs 188 lbs 188 lbs  BMI 35.29 83.09 40.76  Systolic 808 811 031  Diastolic 66 64 73  Pulse 81 74 75      Physical Exam Vitals reviewed.  Constitutional:      Comments: She has short stature, moderately obese and in no acute distress.  Neck:     Thyroid: No thyromegaly.     Vascular: No JVD.  Cardiovascular:     Rate and Rhythm: Normal rate and regular rhythm.     Pulses: Intact distal pulses.     Heart sounds: Normal heart sounds, S1 normal and S2 normal. No murmur heard.   No gallop.     Comments: No  JVD. Pulmonary:     Effort: Pulmonary effort is normal. No respiratory distress.     Breath sounds: Normal breath sounds. No wheezing, rhonchi or rales.  Musculoskeletal:     Right lower leg: No edema.     Left lower leg: No edema.  Neurological:     Mental Status: She is alert.   Laboratory examination:   CMP Latest Ref Rng & Units 08/06/2021 07/12/2021 10/08/2020  Glucose 70 - 99 mg/dL 89 98 98  BUN 8 - 27 mg/dL '21 23 16  ' Creatinine 0.57 - 1.00 mg/dL 0.95 1.03(H) 0.82  Sodium 134 - 144 mmol/L 138 136 140  Potassium 3.5 - 5.2 mmol/L 4.9 5.3(H) 4.8  Chloride 96 - 106 mmol/L 100 98 101  CO2 20 - 29 mmol/L '23 21 23  ' Calcium 8.7 - 10.3 mg/dL 10.0 10.0 9.8  Total Protein 6.0 - 8.5 g/dL - 7.1 7.3  Total Bilirubin 0.0 - 1.2 mg/dL - 0.8 0.8  Alkaline Phos 44 - 121 IU/L - 101 99  AST 0 - 40 IU/L - 22 19  ALT 0 - 32 IU/L - 18 16   CBC Latest Ref Rng & Units 08/06/2021 10/08/2020 11/26/2013  WBC 3.4 - 10.8 x10E3/uL 7.1 5.3 -  Hemoglobin 11.1 - 15.9 g/dL 14.2 14.0 14.2  Hematocrit 34.0 - 46.6 % 42.2 42.4 -  Platelets 150 - 450 x10E3/uL 303 243 -   Lipid Panel     Component Value Date/Time   CHOL 242 (H) 07/12/2021 0930   TRIG 125 07/12/2021 0930   HDL 62 07/12/2021 0930   LDLCALC 158 (H) 07/12/2021 0930   HEMOGLOBIN A1C Lab Results  Component Value Date   HGBA1C 5.5 07/12/2021   TSH No results for input(s): TSH in the last 8760 hours.   External labs:  Cholesterol, total 198.000 m 06/03/2019 HDL 60 MG/DL 06/03/2019 LDL 123.000 m 06/03/2019 Triglycerides 77.000 06/03/2019  Hemoglobin 13.500 g/d 06/03/2019  Creatinine, Serum 0.900 mg/ 06/03/2019  ALT (SGPT) 17.000 uni 06/03/2019  TSH 1.760 06/03/2019  Labs 07/04/2018: Serum glucose 91 mg, BUN 17, creatinine 0.8, eGFR greater than 61, potassium 4.5.  HB 13.0/HCT 40.9, platelets 236, normal indicis.  TSH 1.31, normal.  Total cholesterol 197, triglycerides 85, HDL 66, LDL 114.  Non-HDL cholesterol 131.  Allergies   Allergies   Allergen Reactions   Triple Antibiotic [Bacitracin-Neomycin-Polymyxin] Rash    Medications Prior to Visit:   Outpatient Medications Prior to Visit  Medication Sig Dispense Refill   aliskiren (TEKTURNA) 300 MG tablet Take 300 mg by mouth daily.     amLODipine (NORVASC) 10 MG tablet Take 10 mg by mouth daily.  ascorbic acid (VITAMIN C) 500 MG tablet Take 500 mg by mouth daily.     buPROPion (WELLBUTRIN SR) 150 MG 12 hr tablet Take 1 tablet (150 mg total) by mouth 2 (two) times daily. 60 tablet 0   Docusate Sodium (COLACE PO) Take by mouth.     ELIQUIS 5 MG TABS tablet TAKE 1 TABLET(5 MG) BY MOUTH TWICE DAILY 180 tablet 1   Multiple Vitamins-Minerals (CENTRUM SILVER PO) Take 1 tablet by mouth daily.     nebivolol (BYSTOLIC) 10 MG tablet Take 10 mg by mouth daily.     Polyethylene Glycol 3350 (MIRALAX PO) Take by mouth daily.      simvastatin (ZOCOR) 40 MG tablet Take 40 mg by mouth every evening.     spironolactone (ALDACTONE) 25 MG tablet TAKE 1 TABLET BY MOUTH EVERY MORNING 90 tablet 2   tirzepatide (MOUNJARO) 7.5 MG/0.5ML Pen Inject 7.5 mg into the skin once a week. 6 mL 2   No facility-administered medications prior to visit.   Final Medications at End of Visit    Current Meds  Medication Sig   aliskiren (TEKTURNA) 300 MG tablet Take 300 mg by mouth daily.   amLODipine (NORVASC) 10 MG tablet Take 10 mg by mouth daily.   ascorbic acid (VITAMIN C) 500 MG tablet Take 500 mg by mouth daily.   buPROPion (WELLBUTRIN SR) 150 MG 12 hr tablet Take 1 tablet (150 mg total) by mouth 2 (two) times daily.   Docusate Sodium (COLACE PO) Take by mouth.   ELIQUIS 5 MG TABS tablet TAKE 1 TABLET(5 MG) BY MOUTH TWICE DAILY   Multiple Vitamins-Minerals (CENTRUM SILVER PO) Take 1 tablet by mouth daily.   nebivolol (BYSTOLIC) 10 MG tablet Take 10 mg by mouth daily.   Polyethylene Glycol 3350 (MIRALAX PO) Take by mouth daily.    simvastatin (ZOCOR) 40 MG tablet Take 40 mg by mouth every evening.    spironolactone (ALDACTONE) 25 MG tablet TAKE 1 TABLET BY MOUTH EVERY MORNING   tirzepatide (MOUNJARO) 7.5 MG/0.5ML Pen Inject 7.5 mg into the skin once a week.   Radiology:  No results found.  Cardiac Studies:   Echo- 04/04/13:  Study is somewhat technically limited due to Obesity. Left ventricular cavity is normal in size.   Mild concentric hypertrophy.   Normal global wall motion.   Normal systolic global function.   Calculated EF 55%.   Doppler evidence of Grade II (pseudonormal) diastolic dysfunction. Left atrial cavity is mildly dilated.  Lexiscan Myoview stress test 03/29/2013: 1. Resting EKG NSR, Poor R wave progression. Cannot exclude anterior infarct, old. Stress EKG was non diagnostic for ischemia. No ST-T changes of ischemia noted with pharmacologic stress testing. Stress symptoms included lightheadedness. Stress terminated due to completion of protocol. 2. The perfusion imaging study demonstrated very mild inferior wall breast attenuation artifact( patient scanned sitting).  There was no definite ischemia or scar.  Perfusion improved with stress images.  Dynamic gated images reveal normal wall motion and endocardial thickening. Left ventricular ejection fraction was estimated to be 79%. 3. This represents a low risk study.  EKG  07/26/2021: Sinus rhythm at a rate of 69 bpm.  Left atrial lodgment.  Normal axis.  Nonspecific T wave abnormality.  Low voltage complexes.  Pete EKG 09/17/2020, no significant change.  09/17/2020: Sinus bradycardia at rate of 59 bpm, left atrial enlargement, normal axis.  Low-voltage complexes.  Nonspecific T wave flattening.  Compared to 09/18/2019, no significant change  Assessment  ICD-10-CM   1. Precordial pain  R07.2     2. Shortness of breath  R06.02     3. Paroxysmal atrial fibrillation (HCC)  I48.0     4. Essential hypertension  I10     CHA2DS2-VASc Score is 2.  Yearly risk of stroke: 2.3% (F, Age).  Score of 1=0.6; 2=2.2; 3=3.2; 4=4.8;  5=7.2; 6=9.8; 7=>9.8) -(CHF; HTN; vasc disease DM,  Female = 1; Age <65 =0; 65-74 = 1,  >75 =2; stroke/embolism= 2).     No orders of the defined types were placed in this encounter.    There are no discontinued medications.   Recommendations:    Felicia Franklin  is a 76 y.o. Caucasian female with history of hypertension and hyperlipidemia and asymptomatic paroxysmal atrial fibrillation and moderate obesity with obstructive sleep apnea and unable to tolerate CPAP. She is on Eliquis and doing well without signs/symptoms of bleeding.   Patient presents for follow-up.  She was seen 07/26/2021 with worsening dyspnea on exertion, therefore advised further cardiac evaluation with echocardiogram and stress test.  Echocardiogram revealed normal LVEF with mildly dilated LA and mild to moderate MR.  Previous echocardiogram in 2014 noted grade 2 diastolic dysfunction and trace MR.  Stress test was overall low risk.  Duden discussed results of stress test and echocardiogram with patient, her questions were addressed.  Also reviewed with patient that lab evaluation was unremarkable, including normal BNP.  Suspect patient's dyspnea on exertion is multifactorial including weight gain and deconditioning.  Although her mitral regurgitation has progressed do not suspect this is significantly contributing to dyspnea as it is mild to moderate.  Advised patient to follow-up with her PCP to consider noncardiac causes of dyspnea on exertion.  Encouraged her to focus on weight loss and increasing walking as able.  Follow-up in 6 months, sooner if needed.   Alethia Berthold, PA-C 10/12/2021, 3:57 PM Office: [phone]

## 2021-10-26 ENCOUNTER — Other Ambulatory Visit: Payer: Self-pay

## 2021-10-26 ENCOUNTER — Ambulatory Visit (INDEPENDENT_AMBULATORY_CARE_PROVIDER_SITE_OTHER): Payer: Medicare PPO | Admitting: Family Medicine

## 2021-10-26 ENCOUNTER — Encounter (INDEPENDENT_AMBULATORY_CARE_PROVIDER_SITE_OTHER): Payer: Self-pay | Admitting: Family Medicine

## 2021-10-26 VITALS — BP 120/71 | HR 83 | Temp 98.3°F | Ht 62.0 in | Wt 189.0 lb

## 2021-10-26 DIAGNOSIS — I10 Essential (primary) hypertension: Secondary | ICD-10-CM | POA: Diagnosis not present

## 2021-10-26 DIAGNOSIS — E1169 Type 2 diabetes mellitus with other specified complication: Secondary | ICD-10-CM

## 2021-10-26 DIAGNOSIS — E669 Obesity, unspecified: Secondary | ICD-10-CM | POA: Diagnosis not present

## 2021-10-26 DIAGNOSIS — Z6834 Body mass index (BMI) 34.0-34.9, adult: Secondary | ICD-10-CM

## 2021-10-26 DIAGNOSIS — E1159 Type 2 diabetes mellitus with other circulatory complications: Secondary | ICD-10-CM | POA: Diagnosis not present

## 2021-10-26 MED ORDER — TIRZEPATIDE 10 MG/0.5ML ~~LOC~~ SOAJ
10.0000 mg | SUBCUTANEOUS | 0 refills | Status: DC
Start: 2021-10-26 — End: 2021-12-22

## 2021-10-26 NOTE — Progress Notes (Signed)
Chief Complaint:   OBESITY Felicia Franklin is here to discuss her progress with her obesity treatment plan along with follow-up of her obesity related diagnoses. [name] is on the Category 2 Plan and states she is following her eating plan approximately 40% of the time. [name] states she is swimming for 1 hour 45 minutes 5 times per week.  Today's visit was #: 20 Starting weight: 198 lbs Starting date: 10/08/2020 Today's weight: 189 lbs Today's date: 10/26/2021 Total lbs lost to date: 9 Total lbs lost since last in-office visit: 0  Interim History: Felicia Franklin has done well with maintaining her weight over the holidays. She is ready to get back on track.   Subjective:   1. Type 2 diabetes mellitus with other specified complication, without long-term current use of insulin (HCC) Felicia Franklin is working on diet and weight loss. No side effects were noted with Mounjaro, but no change in polyphagia.   2. Essential hypertension Felicia Franklin's blood pressure is stable medications. She is working on diet and exercise. She has no signs of hypotension.  Assessment/Plan:   1. Type 2 diabetes mellitus with other specified complication, without long-term current use of insulin (HCC) [name] agreed to increase Mounjaro to 10 mg and we will refill for 1 month. Good blood sugar control is important to decrease the likelihood of diabetic complications such as nephropathy, neuropathy, limb loss, blindness, coronary artery disease, and death. Intensive lifestyle modification including diet, exercise and weight loss are the first line of treatment for diabetes.   - tirzepatide (MOUNJARO) 10 MG/0.5ML Pen; Inject 10 mg into the skin once a week.  Dispense: 6 mL; Refill: 0  2. Essential hypertension [name] will continue with diet and exercise to improve blood pressure control. We will continue to follow as she continues her lifestyle modifications.  3. Obesity with current BMI of 34.6 Felicia Franklin is currently in the action stage of change.  As such, her goal is to continue with weight loss efforts. She has agreed to the Category 2 Plan with breakfast and lunch options.   Exercise goals: As is.  Behavioral modification strategies: increasing lean protein intake and meal planning and cooking strategies.  Felicia Franklin has agreed to follow-up with our clinic in 3 to 4 weeks. She was informed of the importance of frequent follow-up visits to maximize her success with intensive lifestyle modifications for her multiple health conditions.   Objective:   Blood pressure 120/71, pulse 83, temperature 98.3 F (36.8 C), height 5\' 2"  (1.575 m), weight 189 lb (85.7 kg), last menstrual period 10/03/1998, SpO2 97 %. Body mass index is 34.57 kg/m.  General: Cooperative, alert, well developed, in no acute distress. HEENT: Conjunctivae and lids unremarkable. Cardiovascular: Regular rhythm.  Lungs: Normal work of breathing. Neurologic: No focal deficits.   Lab Results  Component Value Date   CREATININE 0.95 08/06/2021   BUN 21 08/06/2021   NA 138 08/06/2021   K 4.9 08/06/2021   CL 100 08/06/2021   CO2 23 08/06/2021   Lab Results  Component Value Date   ALT 18 07/12/2021   AST 22 07/12/2021   ALKPHOS 101 07/12/2021   BILITOT 0.8 07/12/2021   Lab Results  Component Value Date   HGBA1C 5.5 07/12/2021   HGBA1C 5.6 10/08/2020   Lab Results  Component Value Date   INSULIN 22.3 07/12/2021   INSULIN 23.3 10/08/2020   Lab Results  Component Value Date   TSH 2.270 10/08/2020   Lab Results  Component Value Date  CHOL 242 (H) 07/12/2021   HDL 62 07/12/2021   LDLCALC 158 (H) 07/12/2021   TRIG 125 07/12/2021   Lab Results  Component Value Date   VD25OH 52.1 07/12/2021   VD25OH 66.0 10/08/2020   VD25OH 56 10/08/2013   Lab Results  Component Value Date   WBC 7.1 08/06/2021   HGB 14.2 08/06/2021   HCT 42.2 08/06/2021   MCV 91 08/06/2021   PLT 303 08/06/2021   No results found for: IRON, TIBC, FERRITIN  Obesity Behavioral  Intervention:   Approximately 15 minutes were spent on the discussion below.  ASK: We discussed the diagnosis of obesity with [name] today and [name] agreed to give [place] permission to discuss obesity behavioral modification therapy today.  ASSESS: Shia has the diagnosis of obesity and her BMI today is 34.6. Jammi is in the action stage of change.   ADVISE: Felicia Franklin was educated on the multiple health risks of obesity as well as the benefit of weight loss to improve her health. She was advised of the need for long term treatment and the importance of lifestyle modifications to improve her current health and to decrease her risk of future health problems.  AGREE: Multiple dietary modification options and treatment options were discussed and Felicia Franklin agreed to follow the recommendations documented in the above note.  ARRANGE: Felicia Franklin was educated on the importance of frequent visits to treat obesity as outlined per CMS and USPSTF guidelines and agreed to schedule her next follow up appointment today.  Attestation Statements:   Reviewed by clinician on day of visit: allergies, medications, problem list, medical history, surgical history, family history, social history, and previous encounter notes.   I, Burt Knack, am acting as transcriptionist for Quillian Quince, MD.  I have reviewed the above documentation for accuracy and completeness, and I agree with the above. -  Quillian Quince, MD

## 2021-11-30 ENCOUNTER — Ambulatory Visit (INDEPENDENT_AMBULATORY_CARE_PROVIDER_SITE_OTHER): Payer: Medicare PPO | Admitting: Family Medicine

## 2021-12-06 ENCOUNTER — Other Ambulatory Visit (INDEPENDENT_AMBULATORY_CARE_PROVIDER_SITE_OTHER): Payer: Self-pay | Admitting: Family Medicine

## 2021-12-06 DIAGNOSIS — F3289 Other specified depressive episodes: Secondary | ICD-10-CM

## 2021-12-06 NOTE — Telephone Encounter (Signed)
Dr.Beasley 

## 2021-12-15 ENCOUNTER — Other Ambulatory Visit: Payer: Self-pay | Admitting: Cardiology

## 2021-12-16 ENCOUNTER — Ambulatory Visit (INDEPENDENT_AMBULATORY_CARE_PROVIDER_SITE_OTHER): Payer: Medicare PPO | Admitting: Family Medicine

## 2021-12-22 ENCOUNTER — Other Ambulatory Visit: Payer: Self-pay

## 2021-12-22 ENCOUNTER — Ambulatory Visit (INDEPENDENT_AMBULATORY_CARE_PROVIDER_SITE_OTHER): Payer: Medicare PPO | Admitting: Family Medicine

## 2021-12-22 ENCOUNTER — Other Ambulatory Visit (HOSPITAL_COMMUNITY): Payer: Self-pay

## 2021-12-22 ENCOUNTER — Encounter (INDEPENDENT_AMBULATORY_CARE_PROVIDER_SITE_OTHER): Payer: Self-pay | Admitting: Family Medicine

## 2021-12-22 VITALS — BP 124/81 | HR 67 | Temp 97.8°F | Ht 62.0 in | Wt 193.0 lb

## 2021-12-22 DIAGNOSIS — E1169 Type 2 diabetes mellitus with other specified complication: Secondary | ICD-10-CM

## 2021-12-22 DIAGNOSIS — Z6835 Body mass index (BMI) 35.0-35.9, adult: Secondary | ICD-10-CM

## 2021-12-22 DIAGNOSIS — Z7985 Long-term (current) use of injectable non-insulin antidiabetic drugs: Secondary | ICD-10-CM | POA: Diagnosis not present

## 2021-12-22 DIAGNOSIS — E669 Obesity, unspecified: Secondary | ICD-10-CM

## 2021-12-22 DIAGNOSIS — I480 Paroxysmal atrial fibrillation: Secondary | ICD-10-CM

## 2021-12-22 DIAGNOSIS — R632 Polyphagia: Secondary | ICD-10-CM | POA: Diagnosis not present

## 2021-12-22 MED ORDER — TIRZEPATIDE 7.5 MG/0.5ML ~~LOC~~ SOAJ
7.5000 mg | SUBCUTANEOUS | 0 refills | Status: DC
Start: 2021-12-22 — End: 2022-01-18
  Filled 2021-12-22: qty 2, 28d supply, fill #0

## 2021-12-24 NOTE — Progress Notes (Signed)
? ? ? ?Chief Complaint:  ? ?OBESITY ?Felicia Franklin is here to discuss her progress with her obesity treatment plan along with follow-up of her obesity related diagnoses. Felicia Franklin is on the Category 2 Plan with breakfast and lunch options and states she is following her eating plan approximately 10% of the time. Felicia Franklin states she is doing water exercise for 105 minutes 5 times per week. ? ?Today's visit was #: 21 ?Starting weight: 198 lbs ?Starting date: 10/08/2020 ?Today's weight: 193 lbs ?Today's date: 12/22/2021 ?Total lbs lost to date: 5 ?Total lbs lost since last in-office visit: 0 ? ?Interim History: Felicia Franklin has had extra challenges since our last visit and has  gotten off track. Her hunger has increase. She is ready to restart and she is open to looking at other eating plans to help her not get bored. ? ?Subjective:  ? ?1. Type 2 diabetes mellitus with other specified complication, without long-term current use of insulin (HCC) ?Felicia Franklin has  been out of Mounjaro due to pharmacy shortages for 6 weeks. She notes increased polyphagia. ? ?2. Paroxysmal atrial fibrillation (HCC) ?Felicia Franklin is asymptomatic, but she notes her watch states that she has been in aFib 2 times today. She is at normal rate currently. She has questions about getting a second opinion on her cardiac care. ? ?Assessment/Plan:  ? ?1. Type 2 diabetes mellitus with other specified complication, without long-term current use of insulin (HCC) ?Felicia Franklin agreed to decrease Mounjaro to 7.5 mg, and we will refill for 1 month. ? ?- tirzepatide (MOUNJARO) 7.5 MG/0.5ML Pen; Inject 7.5 mg into the skin once a week.  Dispense: 2 mL; Refill: 0 ? ?2. Paroxysmal atrial fibrillation (HCC) ?Felicia Franklin was encouraged to get a second opinion on her current treatment options, and let me know if she  would like a referral. She will continue to work on her diet and weight loss to decrease the risk of exacerbation.  ? ?3. Obesity with current BMI of 35.4 ?Felicia Franklin is currently in the action stage of  change. As such, her goal is to continue with weight loss efforts. She has agreed to the Category 2 Plan or the Vegetarian Plan.  ? ?Multiple plans were discussed, and she will try Category and Vegetarian. ? ?Exercise goals: As is. ? ?Behavioral modification strategies: increasing lean protein intake and meal planning and cooking strategies. ? ?Felicia Franklin has agreed to follow-up with our clinic in 3 to 4 weeks. She was informed of the importance of frequent follow-up visits to maximize her success with intensive lifestyle modifications for her multiple health conditions.  ? ?Objective:  ? ?Blood pressure 124/81, pulse 67, temperature 97.8 ?F (36.6 ?C), height 5\' 2"  (1.575 m), weight 193 lb (87.5 kg), last menstrual period 10/03/1998, SpO2 97 %. ?Body mass index is 35.3 kg/m?. ? ?General: Cooperative, alert, well developed, in no acute distress. ?HEENT: Conjunctivae and lids unremarkable. ?Cardiovascular: Regular rhythm.  ?Lungs: Normal work of breathing. ?Neurologic: No focal deficits.  ? ?Lab Results  ?Component Value Date  ? CREATININE 0.95 08/06/2021  ? BUN 21 08/06/2021  ? NA 138 08/06/2021  ? K 4.9 08/06/2021  ? CL 100 08/06/2021  ? CO2 23 08/06/2021  ? ?Lab Results  ?Component Value Date  ? ALT 18 07/12/2021  ? AST 22 07/12/2021  ? ALKPHOS 101 07/12/2021  ? BILITOT 0.8 07/12/2021  ? ?Lab Results  ?Component Value Date  ? HGBA1C 5.5 07/12/2021  ? HGBA1C 5.6 10/08/2020  ? ?Lab Results  ?Component Value Date  ? INSULIN  22.3 07/12/2021  ? INSULIN 23.3 10/08/2020  ? ?Lab Results  ?Component Value Date  ? TSH 2.270 10/08/2020  ? ?Lab Results  ?Component Value Date  ? CHOL 242 (H) 07/12/2021  ? HDL 62 07/12/2021  ? LDLCALC 158 (H) 07/12/2021  ? TRIG 125 07/12/2021  ? ?Lab Results  ?Component Value Date  ? VD25OH 52.1 07/12/2021  ? VD25OH 66.0 10/08/2020  ? VD25OH 56 10/08/2013  ? ?Lab Results  ?Component Value Date  ? WBC 7.1 08/06/2021  ? HGB 14.2 08/06/2021  ? HCT 42.2 08/06/2021  ? MCV 91 08/06/2021  ? PLT 303  08/06/2021  ? ?No results found for: IRON, TIBC, FERRITIN ? ?Obesity Behavioral Intervention:  ? ?Approximately 15 minutes were spent on the discussion below. ? ?ASK: ?We discussed the diagnosis of obesity with Felicia Franklin today and Felicia Franklin agreed to give [place] permission to discuss obesity behavioral modification therapy today. ? ?ASSESS: ?Felicia Franklin has the diagnosis of obesity and her BMI today is 35.4. Felicia Franklin is in the action stage of change.  ? ?ADVISE: ?Felicia Franklin was educated on the multiple health risks of obesity as well as the benefit of weight loss to improve her health. She was advised of the need for long term treatment and the importance of lifestyle modifications to improve her current health and to decrease her risk of future health problems. ? ?AGREE: ?Multiple dietary modification options and treatment options were discussed and Felicia Franklin agreed to follow the recommendations documented in the above note. ? ?ARRANGE: ?Felicia Franklin was educated on the importance of frequent visits to treat obesity as outlined per CMS and USPSTF guidelines and agreed to schedule her next follow up appointment today. ? ?Attestation Statements:  ? ?Reviewed by clinician on day of visit: allergies, medications, problem list, medical history, surgical history, family history, social history, and previous encounter notes. ? ? ?I, Burt Knack, am acting as transcriptionist for Quillian Quince, MD. ? ?I have reviewed the above documentation for accuracy and completeness, and I agree with the above. -  Quillian Quince, MD ? ? ?

## 2021-12-30 DIAGNOSIS — S80812A Abrasion, left lower leg, initial encounter: Secondary | ICD-10-CM | POA: Diagnosis not present

## 2022-01-07 ENCOUNTER — Other Ambulatory Visit (INDEPENDENT_AMBULATORY_CARE_PROVIDER_SITE_OTHER): Payer: Self-pay | Admitting: Family Medicine

## 2022-01-07 DIAGNOSIS — F3289 Other specified depressive episodes: Secondary | ICD-10-CM

## 2022-01-12 ENCOUNTER — Other Ambulatory Visit (INDEPENDENT_AMBULATORY_CARE_PROVIDER_SITE_OTHER): Payer: Self-pay | Admitting: Family Medicine

## 2022-01-12 DIAGNOSIS — F3289 Other specified depressive episodes: Secondary | ICD-10-CM

## 2022-01-12 NOTE — Telephone Encounter (Signed)
LAST APPOINTMENT DATE: 12/22/21 ?NEXT APPOINTMENT DATE: 01/18/22 ? ? ?[facility] - [place], [address] ?[address] ?[place] [address] ?Phone: [phone] Fax: [phone] ? ?[id number] - [place], [address] ?[place] ?[place] [address] ?Phone: [phone] Fax: [phone] ? ?[facility] ?[address] ?[place] [address] ?Phone: [phone] Fax: [phone] ? ?Patient is requesting a refill of the following medications: ?Requested Prescriptions  ? ?Pending Prescriptions Disp Refills  ? buPROPion (WELLBUTRIN SR) 150 MG 12 hr tablet 60 tablet 0  ?  Sig: Take 1 tablet (150 mg total) by mouth 2 (two) times daily.  ? ? ?Date last filled: 09/02/21 ?Previously prescribed by Dr. Leafy Ro ? ?Lab Results  ?Component Value Date  ? HGBA1C 5.5 07/12/2021  ? HGBA1C 5.6 10/08/2020  ? ?Lab Results  ?Component Value Date  ? LDLCALC 158 (H) 07/12/2021  ? CREATININE 0.95 08/06/2021  ? ?Lab Results  ?Component Value Date  ? VD25OH 52.1 07/12/2021  ? VD25OH 66.0 10/08/2020  ? VD25OH 56 10/08/2013  ? ? ?BP Readings from Last 3 Encounters:  ?12/22/21 124/81  ?10/26/21 120/71  ?10/12/21 133/66  ? ? ?

## 2022-01-13 MED ORDER — BUPROPION HCL ER (SR) 150 MG PO TB12: 150.0000 mg | ORAL_TABLET | Freq: Two times a day (BID) | ORAL | 0 refills | Status: DC

## 2022-01-14 DIAGNOSIS — E785 Hyperlipidemia, unspecified: Secondary | ICD-10-CM | POA: Diagnosis not present

## 2022-01-18 ENCOUNTER — Encounter (INDEPENDENT_AMBULATORY_CARE_PROVIDER_SITE_OTHER): Payer: Self-pay | Admitting: Family Medicine

## 2022-01-18 ENCOUNTER — Ambulatory Visit (INDEPENDENT_AMBULATORY_CARE_PROVIDER_SITE_OTHER): Payer: Medicare PPO | Admitting: Family Medicine

## 2022-01-18 VITALS — BP 109/57 | HR 70 | Temp 98.2°F | Ht 62.0 in | Wt 190.0 lb

## 2022-01-18 DIAGNOSIS — Z6834 Body mass index (BMI) 34.0-34.9, adult: Secondary | ICD-10-CM

## 2022-01-18 DIAGNOSIS — F3289 Other specified depressive episodes: Secondary | ICD-10-CM | POA: Diagnosis not present

## 2022-01-18 DIAGNOSIS — E669 Obesity, unspecified: Secondary | ICD-10-CM

## 2022-01-18 DIAGNOSIS — Z7985 Long-term (current) use of injectable non-insulin antidiabetic drugs: Secondary | ICD-10-CM

## 2022-01-18 DIAGNOSIS — E1169 Type 2 diabetes mellitus with other specified complication: Secondary | ICD-10-CM

## 2022-01-19 ENCOUNTER — Other Ambulatory Visit (HOSPITAL_COMMUNITY): Payer: Self-pay

## 2022-01-19 MED ORDER — TIRZEPATIDE 7.5 MG/0.5ML ~~LOC~~ SOAJ
7.5000 mg | SUBCUTANEOUS | 0 refills | Status: DC
  Filled 2022-01-19: qty 2, 28d supply, fill #0

## 2022-01-19 MED ORDER — BUPROPION HCL ER (SR) 150 MG PO TB12: 150.0000 mg | ORAL_TABLET | Freq: Two times a day (BID) | ORAL | 0 refills | Status: DC

## 2022-01-31 NOTE — Progress Notes (Signed)
? ? ? ?Chief Complaint:  ? ?OBESITY ?Felicia Franklin is here to discuss her progress with her obesity treatment plan along with follow-up of her obesity related diagnoses. Felicia Franklin is on the Category 2 Plan and the Vegetarian Plan and states she is following her eating plan approximately 60% of the time. Felicia Franklin states she is doing water exercises for 90 minutes 5-6 times per week. ? ?Today's visit was #: 71 ?Starting weight: 198 lbs ?Starting date: 10/08/2020 ?Today's weight: 190 lbs ?Today's date: 01/18/2022 ?Total lbs lost to date: 8 ?Total lbs lost since last in-office visit: 3 ? ?Interim History: Felicia Franklin continues to do well with weight loss. She is enjoying the vegetarian plan and she would like to make her own lentil soup recipe is possible. ? ?Subjective:  ? ?1. Type 2 diabetes mellitus with other specified complication, without long-term current use of insulin ([place]) ?Felicia Franklin continues to work on her diet, exercise, and weight loss. She is tolerating Mounjaro and polyphagia is decreased. She denies nausea or vomiting. ? ?2. Other depression with emotional eating ?Felicia Franklin is doing well on her medications, with no side effects noted. Her blood pressure is under control. ? ?Assessment/Plan:  ? ?1. Type 2 diabetes mellitus with other specified complication, without long-term current use of insulin ([place]) ?Felicia Franklin will continue Mounjaro 7.5 mg weekly, and we will refill for 1 month. ? ?- tirzepatide (MOUNJARO) 7.5 MG/0.5ML Pen; Inject 7.5 mg into the skin once a week.  Dispense: 2 mL; Refill: 0 ? ?2. Other depression with emotional eating ?Felicia Franklin will continue Wellbutrin SR , and we will refill for 1 month. ? ?- buPROPion (WELLBUTRIN SR) 150 MG 12 hr tablet; Take 1 tablet (150 mg total) by mouth 2 (two) times daily.  Dispense: 60 tablet; Refill: 0 ? ?3. Obesity with current BMI of 34.8 ?Felicia Franklin is currently in the action stage of change. As such, her goal is to continue with weight loss efforts. She has agreed to the [place].   ? ?Felicia Franklin is ok to use her own lentil soup recipe. ? ?Exercise goals: As is. ? ?Behavioral modification strategies: increasing lean protein intake and meal planning and cooking strategies. ? ?Felicia Franklin has agreed to follow-up with our clinic in 4 weeks. She was informed of the importance of frequent follow-up visits to maximize her success with intensive lifestyle modifications for her multiple health conditions.  ? ?Objective:  ? ?Blood pressure (!) 109/57, pulse 70, temperature 98.2 ?F (36.8 ?C), height 5\' 2"  (1.575 m), weight 190 lb (86.2 kg), last menstrual period 10/03/1998, SpO2 95 %. ?Body mass index is 34.75 kg/m?. ? ?General: Cooperative, alert, well developed, in no acute distress. ?HEENT: Conjunctivae and lids unremarkable. ?Cardiovascular: Regular rhythm.  ?Lungs: Normal work of breathing. ?Neurologic: No focal deficits.  ? ?Lab Results  ?Component Value Date  ? CREATININE 0.95 08/06/2021  ? BUN 21 08/06/2021  ? NA 138 08/06/2021  ? K 4.9 08/06/2021  ? CL 100 08/06/2021  ? CO2 23 08/06/2021  ? ?Lab Results  ?Component Value Date  ? ALT 18 07/12/2021  ? AST 22 07/12/2021  ? ALKPHOS 101 07/12/2021  ? BILITOT 0.8 07/12/2021  ? ?Lab Results  ?Component Value Date  ? HGBA1C 5.5 07/12/2021  ? HGBA1C 5.6 10/08/2020  ? ?Lab Results  ?Component Value Date  ? INSULIN 22.3 07/12/2021  ? INSULIN 23.3 10/08/2020  ? ?Lab Results  ?Component Value Date  ? TSH 2.270 10/08/2020  ? ?Lab Results  ?Component Value Date  ? CHOL 242 (H)  07/12/2021  ? HDL 62 07/12/2021  ? LDLCALC 158 (H) 07/12/2021  ? TRIG 125 07/12/2021  ? ?Lab Results  ?Component Value Date  ? VD25OH 52.1 07/12/2021  ? VD25OH 66.0 10/08/2020  ? VD25OH 56 10/08/2013  ? ?Lab Results  ?Component Value Date  ? WBC 7.1 08/06/2021  ? HGB 14.2 08/06/2021  ? HCT 42.2 08/06/2021  ? MCV 91 08/06/2021  ? PLT 303 08/06/2021  ? ?No results found for: IRON, TIBC, FERRITIN ? ?Obesity Behavioral Intervention:  ? ?Approximately 15 minutes were spent on the discussion  below. ? ?ASK: ?We discussed the diagnosis of obesity with Felicia Franklin today and Felicia Franklin agreed to give [place] permission to discuss obesity behavioral modification therapy today. ? ?ASSESS: ?Felicia Franklin has the diagnosis of obesity and her BMI today is 34.8. Felicia Franklin is in the action stage of change.  ? ?ADVISE: ?Felicia Franklin was educated on the multiple health risks of obesity as well as the benefit of weight loss to improve her health. She was advised of the need for long term treatment and the importance of lifestyle modifications to improve her current health and to decrease her risk of future health problems. ? ?AGREE: ?Multiple dietary modification options and treatment options were discussed and Felicia Franklin agreed to follow the recommendations documented in the above note. ? ?ARRANGE: ?Felicia Franklin was educated on the importance of frequent visits to treat obesity as outlined per CMS and USPSTF guidelines and agreed to schedule her next follow up appointment today. ? ?Attestation Statements:  ? ?Reviewed by clinician on day of visit: allergies, medications, problem list, medical history, surgical history, family history, social history, and previous encounter notes. ? ? ?I, Trixie Dredge, am acting as transcriptionist for Dennard Nip, MD. ? ?I have reviewed the above documentation for accuracy and completeness, and I agree with the above. -  Dennard Nip, MD ? ? ?

## 2022-02-16 ENCOUNTER — Encounter (INDEPENDENT_AMBULATORY_CARE_PROVIDER_SITE_OTHER): Payer: Self-pay | Admitting: Family Medicine

## 2022-02-16 ENCOUNTER — Ambulatory Visit (INDEPENDENT_AMBULATORY_CARE_PROVIDER_SITE_OTHER): Payer: Medicare PPO | Admitting: Family Medicine

## 2022-02-16 VITALS — BP 111/71 | HR 63 | Temp 98.2°F | Ht 62.0 in | Wt 187.0 lb

## 2022-02-16 DIAGNOSIS — E669 Obesity, unspecified: Secondary | ICD-10-CM | POA: Diagnosis not present

## 2022-02-16 DIAGNOSIS — E1169 Type 2 diabetes mellitus with other specified complication: Secondary | ICD-10-CM | POA: Diagnosis not present

## 2022-02-16 DIAGNOSIS — Z7985 Long-term (current) use of injectable non-insulin antidiabetic drugs: Secondary | ICD-10-CM

## 2022-02-16 DIAGNOSIS — Z6834 Body mass index (BMI) 34.0-34.9, adult: Secondary | ICD-10-CM | POA: Diagnosis not present

## 2022-02-16 DIAGNOSIS — E119 Type 2 diabetes mellitus without complications: Secondary | ICD-10-CM | POA: Insufficient documentation

## 2022-02-16 DIAGNOSIS — I10 Essential (primary) hypertension: Secondary | ICD-10-CM

## 2022-02-16 MED ORDER — TIRZEPATIDE 7.5 MG/0.5ML ~~LOC~~ SOAJ: 7.5000 mg | SUBCUTANEOUS | 0 refills | Status: DC

## 2022-02-22 ENCOUNTER — Ambulatory Visit (HOSPITAL_BASED_OUTPATIENT_CLINIC_OR_DEPARTMENT_OTHER): Payer: Self-pay | Admitting: Cardiovascular Disease

## 2022-03-02 NOTE — Progress Notes (Signed)
Chief Complaint:   OBESITY Felicia Franklin is here to discuss her progress with her obesity treatment plan along with follow-up of her obesity related diagnoses. Felicia Franklin is on the Vegetarian Plan and states she is following her eating plan approximately 50% of the time. Felicia Franklin states she is doing water exercise for 90 minutes 5-6 times per week.  Today's visit was #: 23 Starting weight: 198 lbs Starting date: 10/08/2020 Today's weight: 187 lbs Today's date: 02/16/2022 Total lbs lost to date: 11 Total lbs lost since last in-office visit: 3  Interim History: Felicia Franklin continues to do well with weight loss. She is doing well with her eating and exercise. She did some celebration eating but she is trying to make better choices.   Subjective:   1. Essential hypertension Felicia Franklin's blood pressure is well controlled on her medications, and with diet, exercise, and weights loss. She has no signs of hypotension.   2. Type 2 diabetes mellitus with other specified complication, without long-term current use of insulin (HCC) Felicia Franklin is on Mounjaro and she is doing well. Her hunger is mostly controlled. No side effects were noted.  Assessment/Plan:   1. Essential hypertension Felicia Franklin will continue with her diet and exercise, may need to decrease medications in the future.   2. Type 2 diabetes mellitus with other specified complication, without long-term current use of insulin (HCC) We will refill Mounjaro for 1 month. Good blood sugar control is important to decrease the likelihood of diabetic complications such as nephropathy, neuropathy, limb loss, blindness, coronary artery disease, and death. Intensive lifestyle modification including diet, exercise and weight loss are the first line of treatment for diabetes.   - tirzepatide (MOUNJARO) 7.5 MG/0.5ML Pen; Inject 7.5 mg into the skin once a week.  Dispense: 2 mL; Refill: 0  3. Obesity, Current BMI 34.3 Felicia Franklin is currently in the action stage of change. As such,  her goal is to continue with weight loss efforts. She has agreed to the Vegetarian Plan.   Exercise goals: As is.  Behavioral modification strategies: no skipping meals and meal planning and cooking strategies.  Felicia Franklin has agreed to follow-up with our clinic in 3 to 4 weeks. She was informed of the importance of frequent follow-up visits to maximize her success with intensive lifestyle modifications for her multiple health conditions.   Objective:   Blood pressure 111/71, pulse 63, temperature 98.2 F (36.8 C), height 5\' 2"  (1.575 m), weight 187 lb (84.8 kg), last menstrual period 10/03/1998, SpO2 94 %. Body mass index is 34.2 kg/m.  General: Cooperative, alert, well developed, in no acute distress. HEENT: Conjunctivae and lids unremarkable. Cardiovascular: Regular rhythm.  Lungs: Normal work of breathing. Neurologic: No focal deficits.   Lab Results  Component Value Date   CREATININE 0.95 08/06/2021   BUN 21 08/06/2021   NA 138 08/06/2021   K 4.9 08/06/2021   CL 100 08/06/2021   CO2 23 08/06/2021   Lab Results  Component Value Date   ALT 18 07/12/2021   AST 22 07/12/2021   ALKPHOS 101 07/12/2021   BILITOT 0.8 07/12/2021   Lab Results  Component Value Date   HGBA1C 5.5 07/12/2021   HGBA1C 5.6 10/08/2020   Lab Results  Component Value Date   INSULIN 22.3 07/12/2021   INSULIN 23.3 10/08/2020   Lab Results  Component Value Date   TSH 2.270 10/08/2020   Lab Results  Component Value Date   CHOL 242 (H) 07/12/2021   HDL 62 07/12/2021  LDLCALC 158 (H) 07/12/2021   TRIG 125 07/12/2021   Lab Results  Component Value Date   VD25OH 52.1 07/12/2021   VD25OH 66.0 10/08/2020   VD25OH 56 10/08/2013   Lab Results  Component Value Date   WBC 7.1 08/06/2021   HGB 14.2 08/06/2021   HCT 42.2 08/06/2021   MCV 91 08/06/2021   PLT 303 08/06/2021   No results found for: IRON, TIBC, FERRITIN  Obesity Behavioral Intervention:   Approximately 15 minutes were spent on  the discussion below.  ASK: We discussed the diagnosis of obesity with Felicia Franklin today and Felicia Franklin agreed to give [place] permission to discuss obesity behavioral modification therapy today.  ASSESS: Felicia Franklin has the diagnosis of obesity and her BMI today is 34.3. Felicia Franklin is in the action stage of change.   ADVISE: Felicia Franklin was educated on the multiple health risks of obesity as well as the benefit of weight loss to improve her health. She was advised of the need for long term treatment and the importance of lifestyle modifications to improve her current health and to decrease her risk of future health problems.  AGREE: Multiple dietary modification options and treatment options were discussed and Felicia Franklin agreed to follow the recommendations documented in the above note.  ARRANGE: Felicia Franklin was educated on the importance of frequent visits to treat obesity as outlined per CMS and USPSTF guidelines and agreed to schedule her next follow up appointment today.  Attestation Statements:   Reviewed by clinician on day of visit: allergies, medications, problem list, medical history, surgical history, family history, social history, and previous encounter notes.   I, Burt Knack, am acting as transcriptionist for Quillian Quince, MD.  I have reviewed the above documentation for accuracy and completeness, and I agree with the above. -  Quillian Quince, MD

## 2022-03-14 ENCOUNTER — Ambulatory Visit (INDEPENDENT_AMBULATORY_CARE_PROVIDER_SITE_OTHER): Payer: Medicare PPO | Admitting: Family Medicine

## 2022-03-14 ENCOUNTER — Encounter (INDEPENDENT_AMBULATORY_CARE_PROVIDER_SITE_OTHER): Payer: Self-pay | Admitting: Family Medicine

## 2022-03-14 ENCOUNTER — Other Ambulatory Visit (HOSPITAL_COMMUNITY): Payer: Self-pay

## 2022-03-14 VITALS — BP 112/70 | HR 65 | Temp 98.3°F | Ht 62.0 in | Wt 186.0 lb

## 2022-03-14 DIAGNOSIS — F3289 Other specified depressive episodes: Secondary | ICD-10-CM | POA: Diagnosis not present

## 2022-03-14 DIAGNOSIS — Z7985 Long-term (current) use of injectable non-insulin antidiabetic drugs: Secondary | ICD-10-CM | POA: Diagnosis not present

## 2022-03-14 DIAGNOSIS — E1169 Type 2 diabetes mellitus with other specified complication: Secondary | ICD-10-CM | POA: Diagnosis not present

## 2022-03-14 DIAGNOSIS — Z6834 Body mass index (BMI) 34.0-34.9, adult: Secondary | ICD-10-CM | POA: Diagnosis not present

## 2022-03-14 DIAGNOSIS — E669 Obesity, unspecified: Secondary | ICD-10-CM | POA: Diagnosis not present

## 2022-03-14 MED ORDER — BUPROPION HCL ER (SR) 150 MG PO TB12: 150.0000 mg | ORAL_TABLET | Freq: Two times a day (BID) | ORAL | 0 refills | Status: DC

## 2022-03-14 MED ORDER — TIRZEPATIDE 10 MG/0.5ML ~~LOC~~ SOAJ
10.0000 mg | SUBCUTANEOUS | 0 refills | Status: DC
  Filled 2022-03-14: qty 2, 28d supply, fill #0

## 2022-03-14 NOTE — Progress Notes (Unsigned)
Chief Complaint:   OBESITY Felicia Franklin is here to discuss her progress with her obesity treatment plan along with follow-up of her obesity related diagnoses. Felicia Franklin is on the [place] and states she is following her eating plan approximately 40% of the time. Felicia Franklin states she is doing water aerobics for 45 minutes 5 times per week.  Today's visit was #: 24 Starting weight: 198 lbs Starting date: 10/08/2020 Today's weight: 186 lbs Today's date: 03/14/2022 Total lbs lost to date: 12 Total lbs lost since last in-office visit: 1  Interim History: Felicia Franklin is doing well following her vegetarian plan.  She is getting a bit bored with her options however and would like more choices.    Subjective:   1. Type 2 diabetes mellitus with other specified complication, without long-term current use of insulin (HCC) Felicia Franklin continues to work on her diet and exercise. She is stable on Mounjaro with no side effects, but she notes increased polyphagia.  2. Other depression with emotional eating Felicia Franklin is stable on her medications, and she requested a refill today.  Assessment/Plan:   1. Type 2 diabetes mellitus with other specified complication, without long-term current use of insulin (HCC) Felicia Franklin agreed to increase Mounjaro to 10 mg weekly with no refills.  Good blood sugar control is important to decrease the likelihood of diabetic complications such as nephropathy, neuropathy, limb loss, blindness, coronary artery disease, and death. Intensive lifestyle modification including diet, exercise and weight loss are the first line of treatment for diabetes.   - tirzepatide (MOUNJARO) 10 MG/0.5ML Pen; Inject 10 mg into the skin once a week.  Dispense: 2 mL; Refill: 0  2. Other depression with emotional eating We will refill Wellbutrin SR for 1 month.  Behavior modification techniques were discussed today to help Felicia Franklin deal with her emotional/non-hunger eating behaviors.  Orders and follow up as documented in  patient record.   - buPROPion (WELLBUTRIN SR) 150 MG 12 hr tablet; Take 1 tablet (150 mg total) by mouth 2 (two) times daily.  Dispense: 60 tablet; Refill: 0  3. Obesity, Current BMI 34.1 Felicia Franklin is currently in the action stage of change. As such, her goal is to continue with weight loss efforts. She has agreed to the [place] and keeping a food journal and adhering to recommended goals of 400-550 calories and 35+ grams of protein at supper daily.   We will recheck fasting labs at her next visit.  Exercise goals: As is.   Behavioral modification strategies: increasing lean protein intake.  Felicia Franklin has agreed to follow-up with our clinic in 4 weeks. She was informed of the importance of frequent follow-up visits to maximize her success with intensive lifestyle modifications for her multiple health conditions.   Objective:   Blood pressure 112/70, pulse 65, temperature 98.3 F (36.8 C), height 5\' 2"  (1.575 m), weight 186 lb (84.4 kg), last menstrual period 10/03/1998, SpO2 96 %. Body mass index is 34.02 kg/m.  General: Cooperative, alert, well developed, in no acute distress. HEENT: Conjunctivae and lids unremarkable. Cardiovascular: Regular rhythm.  Lungs: Normal work of breathing. Neurologic: No focal deficits.   Lab Results  Component Value Date   CREATININE 0.95 08/06/2021   BUN 21 08/06/2021   NA 138 08/06/2021   K 4.9 08/06/2021   CL 100 08/06/2021   CO2 23 08/06/2021   Lab Results  Component Value Date   ALT 18 07/12/2021   AST 22 07/12/2021   ALKPHOS 101 07/12/2021   BILITOT 0.8  07/12/2021   Lab Results  Component Value Date   HGBA1C 5.5 07/12/2021   HGBA1C 5.6 10/08/2020   Lab Results  Component Value Date   INSULIN 22.3 07/12/2021   INSULIN 23.3 10/08/2020   Lab Results  Component Value Date   TSH 2.270 10/08/2020   Lab Results  Component Value Date   CHOL 242 (H) 07/12/2021   HDL 62 07/12/2021   LDLCALC 158 (H) 07/12/2021   TRIG 125  07/12/2021   Lab Results  Component Value Date   VD25OH 52.1 07/12/2021   VD25OH 66.0 10/08/2020   VD25OH 56 10/08/2013   Lab Results  Component Value Date   WBC 7.1 08/06/2021   HGB 14.2 08/06/2021   HCT 42.2 08/06/2021   MCV 91 08/06/2021   PLT 303 08/06/2021   No results found for: "IRON", "TIBC", "FERRITIN"  Obesity Behavioral Intervention:   Approximately 15 minutes were spent on the discussion below.  ASK: We discussed the diagnosis of obesity with Sumaiyah today and Sonakshi agreed to give [place] permission to discuss obesity behavioral modification therapy today.  ASSESS: Lyndsay has the diagnosis of obesity and her BMI today is 34.1. Catheryn is in the action stage of change.   ADVISE: Erik was educated on the multiple health risks of obesity as well as the benefit of weight loss to improve her health. She was advised of the need for long term treatment and the importance of lifestyle modifications to improve her current health and to decrease her risk of future health problems.  AGREE: Multiple dietary modification options and treatment options were discussed and Saisha agreed to follow the recommendations documented in the above note.  ARRANGE: Berthe was educated on the importance of frequent visits to treat obesity as outlined per CMS and USPSTF guidelines and agreed to schedule her next follow up appointment today.  Attestation Statements:   Reviewed by clinician on day of visit: allergies, medications, problem list, medical history, surgical history, family history, social history, and previous encounter notes.   I, Felicia Franklin, am acting as transcriptionist for Felicia Nip, MD.  I have reviewed the above documentation for accuracy and completeness, and I agree with the above. -  Felicia Nip, MD

## 2022-03-15 ENCOUNTER — Other Ambulatory Visit: Payer: Self-pay | Admitting: Cardiology

## 2022-03-17 ENCOUNTER — Encounter (HOSPITAL_BASED_OUTPATIENT_CLINIC_OR_DEPARTMENT_OTHER): Payer: Self-pay | Admitting: Cardiovascular Disease

## 2022-03-17 ENCOUNTER — Ambulatory Visit (INDEPENDENT_AMBULATORY_CARE_PROVIDER_SITE_OTHER): Payer: Medicare PPO | Admitting: Cardiovascular Disease

## 2022-03-17 VITALS — BP 126/60 | HR 79 | Ht 62.0 in | Wt 190.2 lb

## 2022-03-17 DIAGNOSIS — I480 Paroxysmal atrial fibrillation: Secondary | ICD-10-CM | POA: Diagnosis not present

## 2022-03-17 DIAGNOSIS — Z9989 Dependence on other enabling machines and devices: Secondary | ICD-10-CM

## 2022-03-17 DIAGNOSIS — R42 Dizziness and giddiness: Secondary | ICD-10-CM | POA: Insufficient documentation

## 2022-03-17 DIAGNOSIS — E7849 Other hyperlipidemia: Secondary | ICD-10-CM | POA: Diagnosis not present

## 2022-03-17 DIAGNOSIS — G4733 Obstructive sleep apnea (adult) (pediatric): Secondary | ICD-10-CM

## 2022-03-17 DIAGNOSIS — I10 Essential (primary) hypertension: Secondary | ICD-10-CM | POA: Diagnosis not present

## 2022-03-17 HISTORY — DX: Dizziness and giddiness: R42

## 2022-03-17 NOTE — Assessment & Plan Note (Signed)
Lipids are reasonably controlled.  Continue simvastatin. She is interested in getting a coronary calcium score.

## 2022-03-17 NOTE — Progress Notes (Signed)
Cardiology Office Note:    Date:  03/17/2022   ID:  VAYDA DUNGEE, DOB 1946/02/06, MRN [id number]  PCP:  Geoffry Paradise, MD   [facility] Providers Cardiologist:  None     Referring MD: Geoffry Paradise, MD   No chief complaint on file. CC: Lightheadedness  History of Present Illness:    Felicia Franklin is a 76 y.o. female with a hx of hypertension, hyperlipidemia, paroxysmal atrial fibrillation, arthritis, sleep apnea, and anxiety. Her for the evaluation of paroxysmal atrial fibrillation.  She was first seen 07/2021 with signs of dyspnea, echo revealed LVEF 63%. She last saw [facility] 10/2021 and reported exertional dyspnea. She had a nuclear stress test 09/2021, with LVEF 67% and no ischemia. Today, She states she doing well. She was first diagnosed 2014 before she retired with atrial fibrillation. At the time she was at OBG/YN, and she was referred to a cardiologist. She does not feel her symptoms when she is in A.Fib. She began to experience lightheadedness, onset a few months ago, and also has been having lower pain. Her apple watch tells her when she is in A.Fib, and she adds that whether she is walking or sitting down and she does not sense her A.Fib. For exercise, she goes to the [facility] 5-6 days a week, and does water aerobics. At home, her blood pressure is normally around 112-113/60 mmHg. For the past year she has not been sleeping well, and she does not use her Cpap machine because it wakes her up at night. She takes pantoprazole, Eloquis and remains compliant with her other medications. Of note, she reports that she changed her meal plans [facility]. The patient denies nocturnal dyspnea, orthopnea or peripheral edema.  There have been no syncope. Complains of A.Fib, lower back pain, and difficulties sleeping.   Past Medical History:  Diagnosis Date   Anxiety    Anxiety    Arthritis    Back pain    Constipation    Essential  hypertension    Hyperlipidemia    Joint pain    Lightheadedness 03/17/2022   Lower back pain    Osteoarthritis    Other fatigue    Paroxysmal atrial fibrillation (HCC)    PONV (postoperative nausea and vomiting)    severe-also can get agitated waking up   Shortness of breath on exertion    Sleep apnea    Wears glasses     Past Surgical History:  Procedure Laterality Date   BUNIONECTOMY     CARPAL TUNNEL RELEASE Left 2007   CARPAL TUNNEL RELEASE Right 1992   CARPOMETACARPAL (CMC) FUSION OF THUMB Right 11/26/2013   Procedure: CARPOMETACARPAL (CMC) FUSION OF THUMB;  Surgeon: Wyn Forster., MD;  Location:  SURGERY CENTER;  Service: Orthopedics;  Laterality: Right;   COLONOSCOPY     FOOT SURGERY Left 2005   bunionectomy-osteotomy   HAMMER TOE SURGERY      Current Medications: Current Meds  Medication Sig   aliskiren (TEKTURNA) 300 MG tablet Take 300 mg by mouth daily.   amLODipine (NORVASC) 10 MG tablet Take 10 mg by mouth daily.   ascorbic acid (VITAMIN C) 500 MG tablet Take 500 mg by mouth daily.   buPROPion (WELLBUTRIN SR) 150 MG 12 hr tablet Take 1 tablet (150 mg total) by mouth 2 (two) times daily.   Collagen-Boron-Hyaluronic Acid (MOVE FREE ULTRA JOINT HEALTH PO) Take 1 tablet by mouth daily.   Docusate Sodium (COLACE  PO) Take by mouth.   ELIQUIS 5 MG TABS tablet TAKE 1 TABLET(5 MG) BY MOUTH TWICE DAILY   Multiple Vitamins-Minerals (CENTRUM SILVER PO) Take 1 tablet by mouth daily.   nebivolol (BYSTOLIC) 10 MG tablet Take 10 mg by mouth daily.   pantoprazole (PROTONIX) 40 MG tablet Take by mouth.   Polyethylene Glycol 3350 (MIRALAX PO) Take by mouth daily.    simvastatin (ZOCOR) 40 MG tablet Take 40 mg by mouth every evening.   spironolactone (ALDACTONE) 25 MG tablet TAKE 1 TABLET BY MOUTH EVERY MORNING     Allergies:   Triple antibiotic [bacitracin-neomycin-polymyxin]   Social History   Socioeconomic History   Marital status: Divorced    Spouse name:  Not on file   Number of children: 1   Years of education: Not on file   Highest education level: Not on file  Occupational History   Occupation: retired  Tobacco Use   Smoking status: Never   Smokeless tobacco: Never  Vaping Use   Vaping Use: Never used  Substance and Sexual Activity   Alcohol use: No    Alcohol/week: 0.0 standard drinks of alcohol   Drug use: No   Sexual activity: Not Currently    Partners: Male    Birth control/protection: Post-menopausal  Other Topics Concern   Not on file  Social History Narrative   Not on file   Social Determinants of Health   Financial Resource Strain: Low Risk  (03/17/2022)   Overall Financial Resource Strain (CARDIA)    Difficulty of Paying Living Expenses: Not hard at all  Food Insecurity: No Food Insecurity (03/17/2022)   Hunger Vital Sign    Worried About Running Out of Food in the Last Year: Never true    Ran Out of Food in the Last Year: Never true  Transportation Needs: No Transportation Needs (03/17/2022)   PRAPARE - Administrator, Civil Service (Medical): No    Lack of Transportation (Non-Medical): No  Physical Activity: Sufficiently Active (03/17/2022)   Exercise Vital Sign    Days of Exercise per Week: 5 days    Minutes of Exercise per Session: 120 min  Stress: Not on file  Social Connections: Not on file     Family History: The patient's family history includes Anxiety disorder in her mother; Breast cancer (age of onset: 60) in her mother; COPD in an other family member; Cancer in her mother and another family member; Colon cancer in her father; Diabetes in her father; Heart disease in her father; Hyperlipidemia in an other family member; Hypertension in her father and mother; Leukemia in her father; Obesity in her father and another family member; Prostate cancer in her father.  ROS:   Please see the history of present illness.    All other systems reviewed and are negative.  EKGs/Labs/Other Studies  Reviewed:    The following studies were reviewed today:  Myocardial Perfusion w/ Lexiscan 09/22/2021:   The study is low risk.   Left ventricular function is normal. End diastolic cavity size is normal. End systolic cavity size is normal.   Lexiscan (with Mod Bruce protocol) Nuclear stress test 09/22/2021: Nondiagnostic ECG stress with Lexiscan.  Prominent breast tissue attenuation in the inferior wall. There is no ischemia or scar.   Overall LV systolic function is normal without regional wall motion abnormalities. Stress LV EF: 67%.  No change from 03/09/2013. Low risk study.    PCV Echo 08/02/2021: Left ventricle cavity is normal in size and  wall thickness. Normal global  wall motion. Normal LV systolic function with EF 63%. Normal diastolic  filling pattern.  Left atrial cavity is mildly dilated.  Mild to moderate mitral regurgitation.  Mild tricuspid regurgitation. Estimated pulmonary artery systolic pressure  24 mmHg.  Mild pulmonic regurgitation.  Previous study in 2014 reported grade II diastolic dysfunction, trace MR,  no TR/PI.  EKG:  EKG is personally reviewed.  03/17/2022 EKG: Rate 79. Sinus Rhythm.  Recent Labs: 07/12/2021: ALT 18 08/06/2021: BNP 19.2; BUN 21; Creatinine, Ser 0.95; Hemoglobin 14.2; Platelets 303; Potassium 4.9; Sodium 138  Recent Lipid Panel    Component Value Date/Time   CHOL 242 (H) 07/12/2021 0930   TRIG 125 07/12/2021 0930   HDL 62 07/12/2021 0930   LDLCALC 158 (H) 07/12/2021 0930        Physical Exam:    VS:  BP 126/60 (BP Location: Right Arm, Patient Position: Sitting, Cuff Size: Large)   Pulse 79   Ht 5\' 2"  (1.575 m)   Wt 190 lb 3.2 oz (86.3 kg)   LMP 10/03/1998   BMI 34.79 kg/m  , BMI Body mass index is 34.79 kg/m. GENERAL:  Well appearing HEENT: Pupils equal round and reactive, fundi not visualized, oral mucosa unremarkable NECK:  No jugular venous distention, waveform within normal limits, carotid upstroke brisk and symmetric,  no bruits, no thyromegaly LUNGS:  Clear to auscultation bilaterally HEART:  RRR.  PMI not displaced or sustained,S1 and S2 within normal limits, no S3, no S4, no clicks, no rubs, no murmurs ABD:  Flat, positive bowel sounds normal in frequency in pitch, no bruits, no rebound, no guarding, no midline pulsatile mass, no hepatomegaly, no splenomegaly EXT:  2 plus pulses throughout, no edema, no cyanosis no clubbing SKIN:  No rashes no nodules NEURO:  Cranial nerves II through XII grossly intact, motor grossly intact throughout PSYCH:  Cognitively intact, oriented to person place and time   ASSESSMENT:    1. Essential hypertension   2. Paroxysmal atrial fibrillation (HCC)   3. Obstructive sleep apnea on CPAP   4. Other hyperlipidemia   5. Lightheadedness    PLAN:    In order of problems listed above:  Paroxysmal atrial fibrillation (HCC) She has asymptomatic episodes of atrial fibrillation. Her episodes were reviewed on her Apple watch.  Rates are well-controlled.  Continue nebivolol and Eliquis.    Essential hypertension Blood pressure is well-controlled.  Continue amlodipine, aliskiren, nebivolol, and spironolactone.  She was encouraged to keep up her exercise.  Obstructive sleep apnea on CPAP She has not tolerated CPAP.  Other hyperlipidemia Lipids are reasonably controlled.  Continue simvastatin. She is interested in getting a coronary calcium score.  Lightheadedness She has intermittent lightheaded episodes that are not associated with orthostasis and don't occur with exertion.  She had a negative stress test and unremarkable echo.  Though she does have PAF, rates seem to be controlled.  It seems to be occurring less frequently.  She will call if it increases in frequency and we will get an ambulatory monitor.   Disposition: Tayte Mcwherter C. 12/02/1998, MD, [facility] in 6 months   Medication Adjustments/Labs and Tests Ordered: Current medicines are reviewed at length with the patient  today.  Concerns regarding medicines are outlined above.  Orders Placed This Encounter  Procedures   CT CARDIAC SCORING (SELF PAY ONLY)   EKG 12-Lead   No orders of the defined types were placed in this encounter.   Patient Instructions  Medication Instructions:  Your physician recommends that you continue on your current medications as directed. Please refer to the Current Medication list given to you today.   *If you need a refill on your cardiac medications before your next appointment, please call your pharmacy*  Lab Work: NONE   Testing/Procedures: CALCIUM SCORE - THIS WILL COST YOU $99 OUT OF POCKET  Follow-Up: At [facility], you and your health needs are our priority.  As part of our continuing mission to provide you with exceptional heart care, we have created designated Provider Care Teams.  These Care Teams include your primary Cardiologist (physician) and Advanced Practice Providers (APPs -  Physician Assistants and Nurse Practitioners) who all work together to provide you with the care you need, when you need it.  We recommend signing up for the patient portal called "MyChart".  Sign up information is provided on this After Visit Summary.  MyChart is used to connect with patients for Virtual Visits (Telemedicine).  Patients are able to view lab/test results, encounter notes, upcoming appointments, etc.  Non-urgent messages can be sent to your provider as well.   To learn more about what you can do with MyChart, go to [email or web address].    Your next appointment:   6 month(s)  The format for your next appointment:   In Person  Provider:   Chilton Si, MD6   Other Instructions IF YOU START BECOMING MORE LIGHTHEADED CALL THE OFFICE AND WILL HAVE YOU WEAR A MONITOR          I,Tinashe Williams,acting as a scribe for [employer], MD.,have documented all relevant documentation on the behalf of Chilton Si, MD,as directed by  Chilton Si,  MD while in the presence of Chilton Si, MD.   I, Mackensi Mahadeo C. Duke Salvia, MD have reviewed all documentation for this visit.  The documentation of the exam, diagnosis, procedures, and orders on 03/17/2022 are all accurate and complete.

## 2022-03-17 NOTE — Assessment & Plan Note (Addendum)
She has asymptomatic episodes of atrial fibrillation. Her episodes were reviewed on her Apple watch.  Rates are well-controlled.  Continue nebivolol and Eliquis.

## 2022-03-17 NOTE — Patient Instructions (Signed)
Medication Instructions:  Your physician recommends that you continue on your current medications as directed. Please refer to the Current Medication list given to you today.   *If you need a refill on your cardiac medications before your next appointment, please call your pharmacy*  Lab Work: NONE   Testing/Procedures: CALCIUM SCORE - THIS WILL COST YOU $99 OUT OF POCKET  Follow-Up: At [facility], you and your health needs are our priority.  As part of our continuing mission to provide you with exceptional heart care, we have created designated Provider Care Teams.  These Care Teams include your primary Cardiologist (physician) and Advanced Practice Providers (APPs -  Physician Assistants and Nurse Practitioners) who all work together to provide you with the care you need, when you need it.  We recommend signing up for the patient portal called "MyChart".  Sign up information is provided on this After Visit Summary.  MyChart is used to connect with patients for Virtual Visits (Telemedicine).  Patients are able to view lab/test results, encounter notes, upcoming appointments, etc.  Non-urgent messages can be sent to your provider as well.   To learn more about what you can do with MyChart, go to [email or web address].    Your next appointment:   6 month(s)  The format for your next appointment:   In Person  Provider:   Chilton Si, MD6   Other Instructions IF YOU START BECOMING MORE LIGHTHEADED CALL THE OFFICE AND WILL HAVE YOU WEAR A MONITOR

## 2022-03-17 NOTE — Assessment & Plan Note (Signed)
She has intermittent lightheaded episodes that are not associated with orthostasis and don't occur with exertion.  She had a negative stress test and unremarkable echo.  Though she does have PAF, rates seem to be controlled.  It seems to be occurring less frequently.  She will call if it increases in frequency and we will get an ambulatory monitor.

## 2022-03-17 NOTE — Assessment & Plan Note (Signed)
She has not tolerated CPAP. °

## 2022-03-17 NOTE — Assessment & Plan Note (Addendum)
Blood pressure is well-controlled.  Continue amlodipine, aliskiren, nebivolol, and spironolactone.  She was encouraged to keep up her exercise.

## 2022-03-18 ENCOUNTER — Telehealth (HOSPITAL_BASED_OUTPATIENT_CLINIC_OR_DEPARTMENT_OTHER): Payer: Self-pay | Admitting: Cardiovascular Disease

## 2022-03-18 NOTE — Telephone Encounter (Signed)
Left message for patient to call and discuss scheduling the Cardiac Calcium scoring ordered by Dr. Duke Salvia

## 2022-03-24 ENCOUNTER — Encounter (HOSPITAL_BASED_OUTPATIENT_CLINIC_OR_DEPARTMENT_OTHER): Payer: Self-pay

## 2022-03-28 DIAGNOSIS — I10 Essential (primary) hypertension: Secondary | ICD-10-CM | POA: Diagnosis not present

## 2022-03-28 DIAGNOSIS — M1990 Unspecified osteoarthritis, unspecified site: Secondary | ICD-10-CM | POA: Diagnosis not present

## 2022-03-28 DIAGNOSIS — I872 Venous insufficiency (chronic) (peripheral): Secondary | ICD-10-CM | POA: Diagnosis not present

## 2022-03-28 DIAGNOSIS — I480 Paroxysmal atrial fibrillation: Secondary | ICD-10-CM | POA: Diagnosis not present

## 2022-03-28 DIAGNOSIS — E669 Obesity, unspecified: Secondary | ICD-10-CM | POA: Diagnosis not present

## 2022-03-28 DIAGNOSIS — K219 Gastro-esophageal reflux disease without esophagitis: Secondary | ICD-10-CM | POA: Diagnosis not present

## 2022-03-28 DIAGNOSIS — N3281 Overactive bladder: Secondary | ICD-10-CM | POA: Diagnosis not present

## 2022-04-01 ENCOUNTER — Ambulatory Visit (HOSPITAL_BASED_OUTPATIENT_CLINIC_OR_DEPARTMENT_OTHER)
Admission: RE | Admit: 2022-04-01 | Discharge: 2022-04-01 | Disposition: A | Discharge status: Home / Self Care | Payer: Medicare PPO | Source: Ambulatory Visit | Attending: Cardiovascular Disease | Admitting: Cardiovascular Disease

## 2022-04-01 DIAGNOSIS — I10 Essential (primary) hypertension: Secondary | ICD-10-CM | POA: Insufficient documentation

## 2022-04-12 ENCOUNTER — Ambulatory Visit: Payer: Medicare PPO | Admitting: Student

## 2022-04-18 ENCOUNTER — Encounter (INDEPENDENT_AMBULATORY_CARE_PROVIDER_SITE_OTHER): Payer: Self-pay | Admitting: Family Medicine

## 2022-04-18 ENCOUNTER — Ambulatory Visit (INDEPENDENT_AMBULATORY_CARE_PROVIDER_SITE_OTHER): Payer: Medicare PPO | Admitting: Family Medicine

## 2022-04-18 VITALS — BP 115/72 | HR 58 | Temp 97.6°F | Ht 62.0 in | Wt 182.0 lb

## 2022-04-18 DIAGNOSIS — Z6833 Body mass index (BMI) 33.0-33.9, adult: Secondary | ICD-10-CM

## 2022-04-18 DIAGNOSIS — Z6836 Body mass index (BMI) 36.0-36.9, adult: Secondary | ICD-10-CM

## 2022-04-18 DIAGNOSIS — E1169 Type 2 diabetes mellitus with other specified complication: Secondary | ICD-10-CM

## 2022-04-18 DIAGNOSIS — Z7985 Long-term (current) use of injectable non-insulin antidiabetic drugs: Secondary | ICD-10-CM

## 2022-04-18 DIAGNOSIS — E669 Obesity, unspecified: Secondary | ICD-10-CM | POA: Diagnosis not present

## 2022-04-18 DIAGNOSIS — E782 Mixed hyperlipidemia: Secondary | ICD-10-CM | POA: Diagnosis not present

## 2022-04-18 DIAGNOSIS — E559 Vitamin D deficiency, unspecified: Secondary | ICD-10-CM

## 2022-04-18 MED ORDER — TIRZEPATIDE 10 MG/0.5ML ~~LOC~~ SOAJ: 10.0000 mg | SUBCUTANEOUS | 0 refills | Status: DC

## 2022-04-19 LAB — CBC WITH DIFFERENTIAL/PLATELET
Basophils Absolute: 0.1 10*3/uL (ref 0.0–0.2)
Basos: 1 %
EOS (ABSOLUTE): 0.2 10*3/uL (ref 0.0–0.4)
Eos: 4 %
Hematocrit: 39.5 % (ref 34.0–46.6)
Hemoglobin: 13.1 g/dL (ref 11.1–15.9)
Immature Grans (Abs): 0 10*3/uL (ref 0.0–0.1)
Immature Granulocytes: 1 %
Lymphocytes Absolute: 1.6 10*3/uL (ref 0.7–3.1)
Lymphs: 26 %
MCH: 30.3 pg (ref 26.6–33.0)
MCHC: 33.2 g/dL (ref 31.5–35.7)
MCV: 91 fL (ref 79–97)
Monocytes Absolute: 0.8 10*3/uL (ref 0.1–0.9)
Monocytes: 14 %
Neutrophils Absolute: 3.3 10*3/uL (ref 1.4–7.0)
Neutrophils: 54 %
Platelets: 255 10*3/uL (ref 150–450)
RBC: 4.33 x10E6/uL (ref 3.77–5.28)
RDW: 13.1 % (ref 11.7–15.4)
WBC: 5.9 10*3/uL (ref 3.4–10.8)

## 2022-04-19 LAB — CMP14+EGFR
ALT: 17 IU/L (ref 0–32)
AST: 20 IU/L (ref 0–40)
Albumin/Globulin Ratio: 1.5 (ref 1.2–2.2)
Albumin: 4.3 g/dL (ref 3.8–4.8)
Alkaline Phosphatase: 109 IU/L (ref 44–121)
BUN/Creatinine Ratio: 18 (ref 12–28)
BUN: 17 mg/dL (ref 8–27)
Bilirubin Total: 0.7 mg/dL (ref 0.0–1.2)
CO2: 20 mmol/L (ref 20–29)
Calcium: 9.8 mg/dL (ref 8.7–10.3)
Chloride: 99 mmol/L (ref 96–106)
Creatinine, Ser: 0.94 mg/dL (ref 0.57–1.00)
Globulin, Total: 2.9 g/dL (ref 1.5–4.5)
Glucose: 80 mg/dL (ref 70–99)
Potassium: 4.7 mmol/L (ref 3.5–5.2)
Sodium: 134 mmol/L (ref 134–144)
Total Protein: 7.2 g/dL (ref 6.0–8.5)
eGFR: 63 mL/min/{1.73_m2} (ref >59)

## 2022-04-19 LAB — LIPID PANEL WITH LDL/HDL RATIO
Cholesterol, Total: 166 mg/dL (ref 100–199)
HDL: 61 mg/dL (ref >39)
LDL Chol Calc (NIH): 88 mg/dL (ref 0–99)
LDL/HDL Ratio: 1.4 ratio (ref 0.0–3.2)
Triglycerides: 93 mg/dL (ref 0–149)
VLDL Cholesterol Cal: 17 mg/dL (ref 5–40)

## 2022-04-19 LAB — TSH: TSH: 2.19 u[IU]/mL (ref 0.450–4.500)

## 2022-04-19 LAB — HEMOGLOBIN A1C
Est. average glucose Bld gHb Est-mCnc: 103 mg/dL
Hgb A1c MFr Bld: 5.2 % (ref 4.8–5.6)

## 2022-04-19 LAB — VITAMIN D 25 HYDROXY (VIT D DEFICIENCY, FRACTURES): Vit D, 25-Hydroxy: 66.9 ng/mL (ref 30.0–100.0)

## 2022-04-19 LAB — VITAMIN B12: Vitamin B-12: 694 pg/mL (ref 232–1245)

## 2022-04-19 LAB — INSULIN, RANDOM: INSULIN: 10.5 u[IU]/mL (ref 2.6–24.9)

## 2022-04-19 NOTE — Progress Notes (Signed)
Chief Complaint:   OBESITY Felicia Franklin is here to discuss her progress with her obesity treatment plan along with follow-up of her obesity related diagnoses. Felicia Franklin is on the [place] and keeping a food journal and adhering to recommended goals of 400-550 calories and 35+ grams of protein at supper daily and states she is following her eating plan approximately 50% of the time. Felicia Franklin states she is doing water exercise for 105 minutes 5 times per week.  Today's visit was #: 25 Starting weight: 198 lbs Starting date: 10/08/2020 Today's weight: 182 lbs Today's date: 04/18/2022 Total lbs lost to date: 16 Total lbs lost since last in-office visit: 4  Interim History: Felicia Franklin continues to do well with weight loss.  She is on a high-protein vegetarian plan, and she is exercising regularly.  She notes her sleep is not as good however.  Subjective:   1. Mixed hyperlipidemia Felicia Franklin is on statin and she had a recent cardiac score done by her new Cardiologist.  2. Type 2 diabetes mellitus with other specified complication, unspecified whether long term insulin use ([place]) Felicia Franklin continues to do well with her diet and weight loss.  She is working on increasing protein.  3. Vitamin D deficiency Felicia Franklin has a history of vitamin D deficiency.  Assessment/Plan:   1. Mixed hyperlipidemia We will check labs today. Felicia Franklin will continue Zocor and will continue to decrease cholesterol in her diet.   - Lipid Panel With LDL/HDL Ratio - TSH  2. Type 2 diabetes mellitus with other specified complication, unspecified whether long term insulin use (HCC) We will check labs today. Felicia Franklin will continue Mounjaro 10 mg once weekly, and we will refill for 1 month.  - CMP14+EGFR - Vitamin B12 - Insulin, random - Hemoglobin A1c - CBC with Differential/Platelet - tirzepatide (MOUNJARO) 10 MG/0.5ML Pen; Inject 10 mg into the skin once a week.  Dispense: 2 mL; Refill: 0  3. Vitamin D deficiency We will check labs  today. Felicia Franklin will follow-up for routine testing of Vitamin D, at least 2-3 times per year to avoid over-replacement.  - VITAMIN D 25 Hydroxy (Vit-D Deficiency, Fractures)  4. Obesity, Current BMI 33.4 Felicia Franklin is currently in the action stage of change. As such, her goal is to continue with weight loss efforts. She has agreed to the Category 2 Plan or the [place].   Exercise goals: As is.   Behavioral modification strategies: increasing lean protein intake.  Felicia Franklin has agreed to follow-up with our clinic in 4 weeks. She was informed of the importance of frequent follow-up visits to maximize her success with intensive lifestyle modifications for her multiple health conditions.   Felicia Franklin was informed we would discuss her lab results at her next visit unless there is a critical issue that needs to be addressed sooner. Felicia Franklin agreed to keep her next visit at the agreed upon time to discuss these results.  Objective:   Blood pressure 115/72, pulse (!) 58, temperature 97.6 F (36.4 C), height _0  (1.575 m), weight 182 lb (82.6 kg), last menstrual period 10/03/1998, SpO2 98 %. Body mass index is 33.29 kg/m.  General: Cooperative, alert, well developed, in no acute distress. HEENT: Conjunctivae and lids unremarkable. Cardiovascular: Regular rhythm.  Lungs: Normal work of breathing. Neurologic: No focal deficits.   Lab Results  Component Value Date   CREATININE 0.94 04/18/2022   BUN 17 04/18/2022   NA 134 04/18/2022   K 4.7 04/18/2022   CL 99 04/18/2022  CO2 20 04/18/2022   Lab Results  Component Value Date   ALT 17 04/18/2022   AST 20 04/18/2022   ALKPHOS 109 04/18/2022   BILITOT 0.7 04/18/2022   Lab Results  Component Value Date   HGBA1C 5.2 04/18/2022   HGBA1C 5.5 07/12/2021   HGBA1C 5.6 10/08/2020   Lab Results  Component Value Date   INSULIN 10.5 04/18/2022   INSULIN 22.3 07/12/2021   INSULIN 23.3 10/08/2020   Lab Results  Component Value Date   TSH 2.190  04/18/2022   Lab Results  Component Value Date   CHOL 166 04/18/2022   HDL 61 04/18/2022   LDLCALC 88 04/18/2022   TRIG 93 04/18/2022   Lab Results  Component Value Date   VD25OH 66.9 04/18/2022   VD25OH 52.1 07/12/2021   VD25OH 66.0 10/08/2020   Lab Results  Component Value Date   WBC 5.9 04/18/2022   HGB 13.1 04/18/2022   HCT 39.5 04/18/2022   MCV 91 04/18/2022   PLT 255 04/18/2022   No results found for: "IRON", "TIBC", "FERRITIN"  Obesity Behavioral Intervention:   Approximately 15 minutes were spent on the discussion below.  ASK: We discussed the diagnosis of obesity with Felicia Franklin today and Felicia Franklin agreed to give [place] permission to discuss obesity behavioral modification therapy today.  ASSESS: Felicia Franklin has the diagnosis of obesity and her BMI today is 33.4. Felicia Franklin is in the action stage of change.   ADVISE: Felicia Franklin was educated on the multiple health risks of obesity as well as the benefit of weight loss to improve her health. She was advised of the need for long term treatment and the importance of lifestyle modifications to improve her current health and to decrease her risk of future health problems.  AGREE: Multiple dietary modification options and treatment options were discussed and Felicia Franklin agreed to follow the recommendations documented in the above note.  ARRANGE: Felicia Franklin was educated on the importance of frequent visits to treat obesity as outlined per CMS and USPSTF guidelines and agreed to schedule her next follow up appointment today.  Attestation Statements:   Reviewed by clinician on day of visit: allergies, medications, problem list, medical history, surgical history, family history, social history, and previous encounter notes.   I, Trixie Dredge, am acting as transcriptionist for Dennard Nip, MD.  I have reviewed the above documentation for accuracy and completeness, and I agree with the above. -  Dennard Nip, MD

## 2022-04-27 ENCOUNTER — Encounter (INDEPENDENT_AMBULATORY_CARE_PROVIDER_SITE_OTHER): Payer: Self-pay | Admitting: Family Medicine

## 2022-04-29 ENCOUNTER — Other Ambulatory Visit (INDEPENDENT_AMBULATORY_CARE_PROVIDER_SITE_OTHER): Payer: Self-pay | Admitting: Family Medicine

## 2022-04-29 DIAGNOSIS — F3289 Other specified depressive episodes: Secondary | ICD-10-CM

## 2022-05-11 ENCOUNTER — Encounter (INDEPENDENT_AMBULATORY_CARE_PROVIDER_SITE_OTHER): Payer: Self-pay

## 2022-05-16 ENCOUNTER — Other Ambulatory Visit (HOSPITAL_COMMUNITY): Payer: Self-pay

## 2022-05-16 ENCOUNTER — Ambulatory Visit (INDEPENDENT_AMBULATORY_CARE_PROVIDER_SITE_OTHER): Payer: Medicare PPO | Admitting: Family Medicine

## 2022-05-16 ENCOUNTER — Encounter (INDEPENDENT_AMBULATORY_CARE_PROVIDER_SITE_OTHER): Payer: Self-pay | Admitting: Family Medicine

## 2022-05-16 VITALS — BP 120/70 | HR 60 | Temp 98.2°F | Ht 62.0 in | Wt 183.0 lb

## 2022-05-16 DIAGNOSIS — Z6833 Body mass index (BMI) 33.0-33.9, adult: Secondary | ICD-10-CM

## 2022-05-16 DIAGNOSIS — E669 Obesity, unspecified: Secondary | ICD-10-CM | POA: Diagnosis not present

## 2022-05-16 DIAGNOSIS — F3289 Other specified depressive episodes: Secondary | ICD-10-CM | POA: Diagnosis not present

## 2022-05-16 DIAGNOSIS — Z7985 Long-term (current) use of injectable non-insulin antidiabetic drugs: Secondary | ICD-10-CM | POA: Diagnosis not present

## 2022-05-16 DIAGNOSIS — E1169 Type 2 diabetes mellitus with other specified complication: Secondary | ICD-10-CM

## 2022-05-16 MED ORDER — TIRZEPATIDE 10 MG/0.5ML ~~LOC~~ SOAJ
10.0000 mg | SUBCUTANEOUS | 0 refills | Status: DC
  Filled 2022-05-16: qty 2, 28d supply, fill #0

## 2022-05-16 MED ORDER — BUPROPION HCL ER (SR) 150 MG PO TB12: 150.0000 mg | ORAL_TABLET | Freq: Two times a day (BID) | ORAL | 0 refills | Status: DC

## 2022-05-20 DIAGNOSIS — H43813 Vitreous degeneration, bilateral: Secondary | ICD-10-CM | POA: Diagnosis not present

## 2022-05-20 DIAGNOSIS — H25813 Combined forms of age-related cataract, bilateral: Secondary | ICD-10-CM | POA: Diagnosis not present

## 2022-05-20 DIAGNOSIS — G35 Multiple sclerosis: Secondary | ICD-10-CM | POA: Diagnosis not present

## 2022-05-20 DIAGNOSIS — H04123 Dry eye syndrome of bilateral lacrimal glands: Secondary | ICD-10-CM | POA: Diagnosis not present

## 2022-05-23 ENCOUNTER — Other Ambulatory Visit (HOSPITAL_COMMUNITY): Payer: Self-pay

## 2022-05-24 NOTE — Progress Notes (Signed)
Chief Complaint:   OBESITY Renesmee is here to discuss her progress with her obesity treatment plan along with follow-up of her obesity related diagnoses. Anniah is on the Category 2 Plan or the Vegetarian Plan and states she is following her eating plan approximately 40% of the time. Stellah states she is swimming for 120 minutes 6 times per week.  Today's visit was #: 26 Starting weight: 198 lbs Starting date: 10/08/2020 Today's weight: 183 lbs Today's date: 05/16/2022 Total lbs lost to date: 15 Total lbs lost since last in-office visit: 0  Interim History: Wrenly is retaining a bit of water weight, but otherwise she has done well with maintaining her weight loss. She is exercising regularly but she may have to cut back for a bit due to needing to use that time for other things.   Subjective:   1. Type 2 diabetes mellitus with other specified complication, unspecified whether long term insulin use (HCC) Kynslie is doing well with her diet and exercise. She was out of [facility] for a bit due to shortages.   2. Other depression with emotional eating Leanda is stable on Wellbutrin, with no side effects noted. She is mindful of her emotional eating behaviors.   Assessment/Plan:   1. Type 2 diabetes mellitus with other specified complication, unspecified whether long term insulin use (HCC) Leonia will continue Mounjaro 10 mg once weekly, and we will refill for 1 month.  - tirzepatide (MOUNJARO) 10 MG/0.5ML Pen; Inject 10 mg into the skin once a week.  Dispense: 2 mL; Refill: 0  2. Other depression with emotional eating Emmalynn will continue Wellbutrin SR 150 mg BID, and we will refill for 1 month.  - buPROPion (WELLBUTRIN SR) 150 MG 12 hr tablet; Take 1 tablet (150 mg total) by mouth 2 (two) times daily.  Dispense: 60 tablet; Refill: 0  3. Obesity, Current BMI 33.6 Alfie is currently in the action stage of change. As such, her goal is to continue with weight loss efforts. She has agreed to  the Category 2 Plan or the Vegetarian Plan.   Exercise goals: As is.   Behavioral modification strategies: increasing lean protein intake and increasing water intake.  Velvet has agreed to follow-up with our clinic in 3 to 4 weeks. She was informed of the importance of frequent follow-up visits to maximize her success with intensive lifestyle modifications for her multiple health conditions.   Objective:   Blood pressure 120/70, pulse 60, temperature 98.2 F (36.8 C), height 5\' 2"  (1.575 m), weight 183 lb (83 kg), last menstrual period 10/03/1998, SpO2 95 %. Body mass index is 33.47 kg/m.  General: Cooperative, alert, well developed, in no acute distress. HEENT: Conjunctivae and lids unremarkable. Cardiovascular: Regular rhythm.  Lungs: Normal work of breathing. Neurologic: No focal deficits.   Lab Results  Component Value Date   CREATININE 0.94 04/18/2022   BUN 17 04/18/2022   NA 134 04/18/2022   K 4.7 04/18/2022   CL 99 04/18/2022   CO2 20 04/18/2022   Lab Results  Component Value Date   ALT 17 04/18/2022   AST 20 04/18/2022   ALKPHOS 109 04/18/2022   BILITOT 0.7 04/18/2022   Lab Results  Component Value Date   HGBA1C 5.2 04/18/2022   HGBA1C 5.5 07/12/2021   HGBA1C 5.6 10/08/2020   Lab Results  Component Value Date   INSULIN 10.5 04/18/2022   INSULIN 22.3 07/12/2021   INSULIN 23.3 10/08/2020   Lab Results  Component Value Date  TSH 2.190 04/18/2022   Lab Results  Component Value Date   CHOL 166 04/18/2022   HDL 61 04/18/2022   LDLCALC 88 04/18/2022   TRIG 93 04/18/2022   Lab Results  Component Value Date   VD25OH 66.9 04/18/2022   VD25OH 52.1 07/12/2021   VD25OH 66.0 10/08/2020   Lab Results  Component Value Date   WBC 5.9 04/18/2022   HGB 13.1 04/18/2022   HCT 39.5 04/18/2022   MCV 91 04/18/2022   PLT 255 04/18/2022   No results found for: "IRON", "TIBC", "FERRITIN"  Obesity Behavioral Intervention:   Approximately 15 minutes were spent  on the discussion below.  ASK: We discussed the diagnosis of obesity with Dali today and Meeghan agreed to give [place] permission to discuss obesity behavioral modification therapy today.  ASSESS: Kendle has the diagnosis of obesity and her BMI today is 33.6. Kana is in the action stage of change.   ADVISE: Karynn was educated on the multiple health risks of obesity as well as the benefit of weight loss to improve her health. She was advised of the need for long term treatment and the importance of lifestyle modifications to improve her current health and to decrease her risk of future health problems.  AGREE: Multiple dietary modification options and treatment options were discussed and Thera agreed to follow the recommendations documented in the above note.  ARRANGE: Sherryann was educated on the importance of frequent visits to treat obesity as outlined per CMS and USPSTF guidelines and agreed to schedule her next follow up appointment today.  Attestation Statements:   Reviewed by clinician on day of visit: allergies, medications, problem list, medical history, surgical history, family history, social history, and previous encounter notes.   I, Burt Knack, am acting as transcriptionist for Quillian Quince, MD.  I have reviewed the above documentation for accuracy and completeness, and I agree with the above. -  Quillian Quince, MD

## 2022-05-30 DIAGNOSIS — Z1231 Encounter for screening mammogram for malignant neoplasm of breast: Secondary | ICD-10-CM | POA: Diagnosis not present

## 2022-06-14 ENCOUNTER — Encounter (INDEPENDENT_AMBULATORY_CARE_PROVIDER_SITE_OTHER): Payer: Self-pay | Admitting: Family Medicine

## 2022-06-14 ENCOUNTER — Ambulatory Visit (INDEPENDENT_AMBULATORY_CARE_PROVIDER_SITE_OTHER): Payer: Medicare PPO | Admitting: Family Medicine

## 2022-06-14 VITALS — BP 117/70 | HR 68 | Temp 97.8°F | Ht 62.0 in | Wt 181.0 lb

## 2022-06-14 DIAGNOSIS — Z6833 Body mass index (BMI) 33.0-33.9, adult: Secondary | ICD-10-CM | POA: Diagnosis not present

## 2022-06-14 DIAGNOSIS — F3289 Other specified depressive episodes: Secondary | ICD-10-CM

## 2022-06-14 DIAGNOSIS — F32A Depression, unspecified: Secondary | ICD-10-CM | POA: Insufficient documentation

## 2022-06-14 DIAGNOSIS — E1169 Type 2 diabetes mellitus with other specified complication: Secondary | ICD-10-CM | POA: Diagnosis not present

## 2022-06-14 DIAGNOSIS — Z7985 Long-term (current) use of injectable non-insulin antidiabetic drugs: Secondary | ICD-10-CM

## 2022-06-14 DIAGNOSIS — E669 Obesity, unspecified: Secondary | ICD-10-CM | POA: Diagnosis not present

## 2022-06-14 MED ORDER — BUPROPION HCL ER (SR) 150 MG PO TB12: 150.0000 mg | ORAL_TABLET | Freq: Two times a day (BID) | ORAL | 0 refills | Status: DC

## 2022-06-14 MED ORDER — TIRZEPATIDE 10 MG/0.5ML ~~LOC~~ SOAJ: 10.0000 mg | SUBCUTANEOUS | 0 refills | Status: DC

## 2022-06-20 NOTE — Progress Notes (Unsigned)
Chief Complaint:   OBESITY Felicia Franklin is here to discuss her progress with her obesity treatment plan along with follow-up of her obesity related diagnoses. Felicia Franklin is on the Category 2 Plan or the [place] and states she is following her eating plan approximately 35% of the time. Felicia Franklin states she is doing water exercise for 5-10 minutes 5-6 times per week.  Today's visit was #: 27 Starting weight: 198 lbs Starting date: 10/08/2020 Today's weight: 181 lbs Today's date: 06/14/2022 Total lbs lost to date: 17 Total lbs lost since last in-office visit: 2  Interim History: Felicia Franklin continues to do well with weight loss. She is staying active and she is doing water exercise most days of the week. Her hunger is mostly controlled and she is doing well with meeting her protein goals. She still struggles with social eating and eating out.   Subjective:   1. Type 2 diabetes mellitus with other specified complication, unspecified whether long term insulin use (HCC) Felicia Franklin is doing well with her diet and weight loss. She notes minimal nausea with Mounjaro.   2. Other depression with emotional eating Felicia Franklin is doing well on her medications. She is working on decreasing emotional eating behaviors. No side effects were noted.   Assessment/Plan:   1. Type 2 diabetes mellitus with other specified complication, unspecified whether long term insulin use (HCC) Felicia Franklin will continue Mounjaro 10 mg once weekly, and we will refill for 1 month.   - tirzepatide (MOUNJARO) 10 MG/0.5ML Pen; Inject 10 mg into the skin once a week.  Dispense: 2 mL; Refill: 0  2. Other depression with emotional eating Felicia Franklin will continue Wellbutrin SR 150 mg BID, and we will refill for 1 month.  - buPROPion (WELLBUTRIN SR) 150 MG 12 hr tablet; Take 1 tablet (150 mg total) by mouth 2 (two) times daily.  Dispense: 60 tablet; Refill: 0  3. Obesity, Current BMI 33.1 Felicia Franklin is currently in the action stage of change. As such, her goal  is to continue with weight loss efforts. She has agreed to the Category 2 Plan.   Eating Out handout was given.  Exercise goals: As is.   Behavioral modification strategies: increasing lean protein intake and meal planning and cooking strategies.  Felicia Franklin has agreed to follow-up with our clinic in 4 weeks. She was informed of the importance of frequent follow-up visits to maximize her success with intensive lifestyle modifications for her multiple health conditions.   Objective:   Blood pressure 117/70, pulse 68, temperature 97.8 F (36.6 C), height 5\' 2"  (1.575 m), weight 181 lb (82.1 kg), last menstrual period 10/03/1998, SpO2 98 %. Body mass index is 33.11 kg/m.  General: Cooperative, alert, well developed, in no acute distress. HEENT: Conjunctivae and lids unremarkable. Cardiovascular: Regular rhythm.  Lungs: Normal work of breathing. Neurologic: No focal deficits.   Lab Results  Component Value Date   CREATININE 0.94 04/18/2022   BUN 17 04/18/2022   NA 134 04/18/2022   K 4.7 04/18/2022   CL 99 04/18/2022   CO2 20 04/18/2022   Lab Results  Component Value Date   ALT 17 04/18/2022   AST 20 04/18/2022   ALKPHOS 109 04/18/2022   BILITOT 0.7 04/18/2022   Lab Results  Component Value Date   HGBA1C 5.2 04/18/2022   HGBA1C 5.5 07/12/2021   HGBA1C 5.6 10/08/2020   Lab Results  Component Value Date   INSULIN 10.5 04/18/2022   INSULIN 22.3 07/12/2021   INSULIN 23.3 10/08/2020  Lab Results  Component Value Date   TSH 2.190 04/18/2022   Lab Results  Component Value Date   CHOL 166 04/18/2022   HDL 61 04/18/2022   LDLCALC 88 04/18/2022   TRIG 93 04/18/2022   Lab Results  Component Value Date   VD25OH 66.9 04/18/2022   VD25OH 52.1 07/12/2021   VD25OH 66.0 10/08/2020   Lab Results  Component Value Date   WBC 5.9 04/18/2022   HGB 13.1 04/18/2022   HCT 39.5 04/18/2022   MCV 91 04/18/2022   PLT 255 04/18/2022   No results found for: "IRON", "TIBC",  "FERRITIN"  Obesity Behavioral Intervention:   Approximately 15 minutes were spent on the discussion below.  ASK: We discussed the diagnosis of obesity with Felicia Franklin today and Felicia Franklin agreed to give [place] permission to discuss obesity behavioral modification therapy today.  ASSESS: Felicia Franklin has the diagnosis of obesity and her BMI today is 33.1. Felicia Franklin is in the action stage of change.   ADVISE: Felicia Franklin was educated on the multiple health risks of obesity as well as the benefit of weight loss to improve her health. She was advised of the need for long term treatment and the importance of lifestyle modifications to improve her current health and to decrease her risk of future health problems.  AGREE: Multiple dietary modification options and treatment options were discussed and Felicia Franklin agreed to follow the recommendations documented in the above note.  ARRANGE: Felicia Franklin was educated on the importance of frequent visits to treat obesity as outlined per CMS and USPSTF guidelines and agreed to schedule her next follow up appointment today.  Attestation Statements:   Reviewed by clinician on day of visit: allergies, medications, problem list, medical history, surgical history, family history, social history, and previous encounter notes.   I, Trixie Dredge, am acting as transcriptionist for Dennard Nip, MD.  I have reviewed the above documentation for accuracy and completeness, and I agree with the above. -  Dennard Nip, MD

## 2022-07-18 ENCOUNTER — Ambulatory Visit (INDEPENDENT_AMBULATORY_CARE_PROVIDER_SITE_OTHER): Payer: Medicare PPO | Admitting: Family Medicine

## 2022-07-18 ENCOUNTER — Encounter (INDEPENDENT_AMBULATORY_CARE_PROVIDER_SITE_OTHER): Payer: Self-pay | Admitting: Family Medicine

## 2022-07-18 VITALS — BP 118/69 | HR 60 | Temp 98.0°F | Ht 62.0 in | Wt 182.0 lb

## 2022-07-18 DIAGNOSIS — Z7985 Long-term (current) use of injectable non-insulin antidiabetic drugs: Secondary | ICD-10-CM

## 2022-07-18 DIAGNOSIS — F3289 Other specified depressive episodes: Secondary | ICD-10-CM | POA: Diagnosis not present

## 2022-07-18 DIAGNOSIS — E1169 Type 2 diabetes mellitus with other specified complication: Secondary | ICD-10-CM

## 2022-07-18 DIAGNOSIS — Z6833 Body mass index (BMI) 33.0-33.9, adult: Secondary | ICD-10-CM | POA: Diagnosis not present

## 2022-07-18 DIAGNOSIS — E669 Obesity, unspecified: Secondary | ICD-10-CM | POA: Diagnosis not present

## 2022-07-18 MED ORDER — TIRZEPATIDE 10 MG/0.5ML ~~LOC~~ SOAJ: 10.0000 mg | SUBCUTANEOUS | 0 refills | Status: DC

## 2022-07-25 NOTE — Progress Notes (Signed)
Chief Complaint:   OBESITY Felicia Franklin is here to discuss her progress with her obesity treatment plan along with follow-up of her obesity related diagnoses. Felicia Franklin is on the Category 2 Plan and states she is following her eating plan approximately 40% of the time. Felicia Franklin states she is doing water exercise for 85 minutes 5 times per week.  Today's visit was #: 28 Starting weight: 198 lbs Starting date: 10/08/2020 Today's weight: 182 lbs Today's date: 07/18/2022 Total lbs lost to date: 16 Total lbs lost since last in-office visit: 0  Interim History: Felicia Franklin has maintained her weight loss, and she is struggling with some increased snacking on ice cream. She is working to get back on track. She is taking GLP-1 without side effects. We discussed strategies for family visiting.   Subjective:   1. Type 2 diabetes mellitus with other specified complication, unspecified whether long term insulin use (HCC) Felicia Franklin is taking Mounjaro, and she is working on [employer] and exercise.  2. Other depression with emotional eating Felicia Franklin is taking Wellbutrin with no side effects noted.   Assessment/Plan:   1. Type 2 diabetes mellitus with other specified complication, unspecified whether long term insulin use (HCC) Felicia Franklin will continue Mounjaro 10 mg once weekly, and we will refill for 1 month.  - tirzepatide (MOUNJARO) 10 MG/0.5ML Pen; Inject 10 mg into the skin once a week.  Dispense: 2 mL; Refill: 0  2. Other depression with emotional eating Felicia Franklin will continue Wellbutrin, diet, and exercise.   3. Obesity, Current BMI 33.3 Felicia Franklin is currently in the action stage of change. As such, her goal is to continue with weight loss efforts. She has agreed to the Category 2 Plan.   Exercise goals: As is.   Behavioral modification strategies: increasing lean protein intake, decreasing simple carbohydrates, meal planning and cooking strategies, and planning for success.  Felicia Franklin has agreed to follow-up with  our clinic in 4 weeks. She was informed of the importance of frequent follow-up visits to maximize her success with intensive lifestyle modifications for her multiple health conditions.   Objective:   Blood pressure 118/69, pulse 60, temperature 98 F (36.7 C), height 5\' 2"  (1.575 m), weight 182 lb (82.6 kg), last menstrual period 10/03/1998, SpO2 97 %. Body mass index is 33.29 kg/m.  General: Cooperative, alert, well developed, in no acute distress. HEENT: Conjunctivae and lids unremarkable. Cardiovascular: Regular rhythm.  Lungs: Normal work of breathing. Neurologic: No focal deficits.   Lab Results  Component Value Date   CREATININE 0.94 04/18/2022   BUN 17 04/18/2022   NA 134 04/18/2022   K 4.7 04/18/2022   CL 99 04/18/2022   CO2 20 04/18/2022   Lab Results  Component Value Date   ALT 17 04/18/2022   AST 20 04/18/2022   ALKPHOS 109 04/18/2022   BILITOT 0.7 04/18/2022   Lab Results  Component Value Date   HGBA1C 5.2 04/18/2022   HGBA1C 5.5 07/12/2021   HGBA1C 5.6 10/08/2020   Lab Results  Component Value Date   INSULIN 10.5 04/18/2022   INSULIN 22.3 07/12/2021   INSULIN 23.3 10/08/2020   Lab Results  Component Value Date   TSH 2.190 04/18/2022   Lab Results  Component Value Date   CHOL 166 04/18/2022   HDL 61 04/18/2022   LDLCALC 88 04/18/2022   TRIG 93 04/18/2022   Lab Results  Component Value Date   VD25OH 66.9 04/18/2022   VD25OH 52.1 07/12/2021   VD25OH 66.0 10/08/2020  Lab Results  Component Value Date   WBC 5.9 04/18/2022   HGB 13.1 04/18/2022   HCT 39.5 04/18/2022   MCV 91 04/18/2022   PLT 255 04/18/2022   No results found for: "IRON", "TIBC", "FERRITIN"  Attestation Statements:   Reviewed by clinician on day of visit: allergies, medications, problem list, medical history, surgical history, family history, social history, and previous encounter notes.   I, Burt Knack, am acting as transcriptionist for Quillian Quince, MD.  I  have reviewed the above documentation for accuracy and completeness, and I agree with the above. -  Quillian Quince, MD

## 2022-08-11 ENCOUNTER — Encounter (INDEPENDENT_AMBULATORY_CARE_PROVIDER_SITE_OTHER): Payer: Self-pay | Admitting: Family Medicine

## 2022-08-11 ENCOUNTER — Ambulatory Visit (INDEPENDENT_AMBULATORY_CARE_PROVIDER_SITE_OTHER): Payer: Medicare PPO | Admitting: Family Medicine

## 2022-08-11 VITALS — BP 125/67 | HR 69 | Temp 97.9°F | Ht 62.0 in | Wt 180.4 lb

## 2022-08-11 DIAGNOSIS — Z6833 Body mass index (BMI) 33.0-33.9, adult: Secondary | ICD-10-CM | POA: Diagnosis not present

## 2022-08-11 DIAGNOSIS — Z7985 Long-term (current) use of injectable non-insulin antidiabetic drugs: Secondary | ICD-10-CM

## 2022-08-11 DIAGNOSIS — E1169 Type 2 diabetes mellitus with other specified complication: Secondary | ICD-10-CM

## 2022-08-11 DIAGNOSIS — E669 Obesity, unspecified: Secondary | ICD-10-CM | POA: Diagnosis not present

## 2022-08-11 DIAGNOSIS — F3289 Other specified depressive episodes: Secondary | ICD-10-CM | POA: Diagnosis not present

## 2022-08-11 MED ORDER — BUPROPION HCL ER (SR) 150 MG PO TB12: 150.0000 mg | ORAL_TABLET | Freq: Two times a day (BID) | ORAL | 0 refills | Status: DC

## 2022-08-29 NOTE — Progress Notes (Signed)
Chief Complaint:   OBESITY Felicia Franklin is here to discuss her progress with her obesity treatment plan along with follow-up of her obesity related diagnoses. Felicia Franklin is on the Category 2 Plan and states she is following her eating plan approximately 40% of the time. Felicia Franklin states she is doing water exercise for 1 hour and 45 minutes 5 times per week.  Today's visit was #: 29 Starting weight: 198 lbs Starting date: 10/08/2020 Today's weight: 180 lbs Today's date: 08/11/2022 Total lbs lost to date: 18 Total lbs lost since last in-office visit: 2  Interim History: Felicia Franklin has done well with weight loss overall.  She is taking GLP-1, but not always taking it on a weekly basis.  She is making strategies to consistently take it weekly.  No side effects were noted.  She has family coming in for a visit and we discussed strategies for dining out.  Subjective:   1. Type 2 diabetes mellitus with other specified complication, unspecified whether long term insulin use (HCC) Felicia Franklin is taking Mounjaro 10 mg.  She has not consistently remember to take it weekly, but she is getting back on track.  No side effects were noted.  2. Other depression with emotional eating Felicia Franklin is taking Wellbutrin with no side effects noted.  She reports that she feels it is helping with cravings, and she continues to work on her healthy eating plan.  Assessment/Plan:   1. Type 2 diabetes mellitus with other specified complication, unspecified whether long term insulin use (HCC) Strategies were discussed for remembering her doses. Felicia Franklin will continue Mounjaro, and will continue with her diet and exercise.  2. Other depression with emotional eating Felicia Franklin will continue Wellbutrin, and will continue with her eating plan and exercise.  We will refill Wellbutrin SR for 1 month.  - buPROPion (WELLBUTRIN SR) 150 MG 12 hr tablet; Take 1 tablet (150 mg total) by mouth 2 (two) times daily.  Dispense: 60 tablet; Refill: 0  3. Obesity,  Current BMI 33.0 Felicia Franklin is currently in the action stage of change. As such, her goal is to continue with weight loss efforts. She has agreed to the Category 2 Plan.   Exercise goals: As is.   Behavioral modification strategies: increasing lean protein intake, emotional eating strategies, and holiday eating strategies .  Felicia Franklin has agreed to follow-up with our clinic in 3 weeks. She was informed of the importance of frequent follow-up visits to maximize her success with intensive lifestyle modifications for her multiple health conditions.   Objective:   Blood pressure 125/67, pulse 69, temperature 97.9 F (36.6 C), height 5\' 2"  (1.575 m), weight 180 lb 6.4 oz (81.8 kg), last menstrual period 10/03/1998, SpO2 98 %. Body mass index is 33 kg/m.  General: Cooperative, alert, well developed, in no acute distress. HEENT: Conjunctivae and lids unremarkable. Cardiovascular: Regular rhythm.  Lungs: Normal work of breathing. Neurologic: No focal deficits.   Lab Results  Component Value Date   CREATININE 0.94 04/18/2022   BUN 17 04/18/2022   NA 134 04/18/2022   K 4.7 04/18/2022   CL 99 04/18/2022   CO2 20 04/18/2022   Lab Results  Component Value Date   ALT 17 04/18/2022   AST 20 04/18/2022   ALKPHOS 109 04/18/2022   BILITOT 0.7 04/18/2022   Lab Results  Component Value Date   HGBA1C 5.2 04/18/2022   HGBA1C 5.5 07/12/2021   HGBA1C 5.6 10/08/2020   Lab Results  Component Value Date   INSULIN 10.5  04/18/2022   INSULIN 22.3 07/12/2021   INSULIN 23.3 10/08/2020   Lab Results  Component Value Date   TSH 2.190 04/18/2022   Lab Results  Component Value Date   CHOL 166 04/18/2022   HDL 61 04/18/2022   LDLCALC 88 04/18/2022   TRIG 93 04/18/2022   Lab Results  Component Value Date   VD25OH 66.9 04/18/2022   VD25OH 52.1 07/12/2021   VD25OH 66.0 10/08/2020   Lab Results  Component Value Date   WBC 5.9 04/18/2022   HGB 13.1 04/18/2022   HCT 39.5 04/18/2022   MCV 91  04/18/2022   PLT 255 04/18/2022   No results found for: "IRON", "TIBC", "FERRITIN"  Attestation Statements:   Reviewed by clinician on day of visit: allergies, medications, problem list, medical history, surgical history, family history, social history, and previous encounter notes.   I, Trixie Dredge, am acting as transcriptionist for Dennard Nip, MD.  I have reviewed the above documentation for accuracy and completeness, and I agree with the above. -  Dennard Nip, MD

## 2022-09-06 ENCOUNTER — Ambulatory Visit (INDEPENDENT_AMBULATORY_CARE_PROVIDER_SITE_OTHER): Payer: Medicare PPO | Admitting: Physician Assistant

## 2022-09-12 ENCOUNTER — Encounter (INDEPENDENT_AMBULATORY_CARE_PROVIDER_SITE_OTHER): Payer: Self-pay | Admitting: Family Medicine

## 2022-09-12 ENCOUNTER — Ambulatory Visit (INDEPENDENT_AMBULATORY_CARE_PROVIDER_SITE_OTHER): Payer: Medicare PPO | Admitting: Family Medicine

## 2022-09-12 VITALS — BP 92/54 | HR 90 | Temp 98.6°F | Ht 62.0 in | Wt 179.0 lb

## 2022-09-12 DIAGNOSIS — F3289 Other specified depressive episodes: Secondary | ICD-10-CM

## 2022-09-12 DIAGNOSIS — Z6832 Body mass index (BMI) 32.0-32.9, adult: Secondary | ICD-10-CM

## 2022-09-12 DIAGNOSIS — E1169 Type 2 diabetes mellitus with other specified complication: Secondary | ICD-10-CM | POA: Diagnosis not present

## 2022-09-12 DIAGNOSIS — Z7985 Long-term (current) use of injectable non-insulin antidiabetic drugs: Secondary | ICD-10-CM | POA: Diagnosis not present

## 2022-09-12 DIAGNOSIS — E669 Obesity, unspecified: Secondary | ICD-10-CM

## 2022-09-12 MED ORDER — TIRZEPATIDE 10 MG/0.5ML ~~LOC~~ SOAJ: 10.0000 mg | SUBCUTANEOUS | 0 refills | Status: DC

## 2022-09-12 MED ORDER — BUPROPION HCL ER (SR) 150 MG PO TB12: 150.0000 mg | ORAL_TABLET | Freq: Two times a day (BID) | ORAL | 0 refills | Status: DC

## 2022-09-21 NOTE — Progress Notes (Signed)
Chief Complaint:   OBESITY Felicia Franklin is here to discuss her progress with her obesity treatment plan along with follow-up of her obesity related diagnoses. Felicia Franklin is on the Category 2 Plan and states she is following her eating plan approximately 30% of the time. Felicia Franklin states she is taking water classes for 95-120 minutes 5-6 times per week.  Today's visit was #: 30 Starting weight: 198 lbs Starting date: 10/08/2020 Today's weight: 179 lbs Today's date: 09/12/2022 Total lbs lost to date: 19 Total lbs lost since last in-office visit: 1  Interim History: Felicia Franklin has done well with avoiding holiday weight gain over Thanksgiving. She notes already having increased social eating around the holidays.   Subjective:   1. Type 2 diabetes mellitus with other specified complication, unspecified whether long term insulin use (HCC) Felicia Franklin continues to do very well on her Mounjaro and diet. She notes decreased polyphagia.   2. Other depression with emotional eating Felicia Franklin is stable on Wellbutrin, with no side effects noted.   Assessment/Plan:   1. Type 2 diabetes mellitus with other specified complication, unspecified whether long term insulin use (HCC) Chiyoko will continue Mounjaro 10 mg once weekly, and we will refill for 1 month.   - tirzepatide (MOUNJARO) 10 MG/0.5ML Pen; Inject 10 mg into the skin once a week.  Dispense: 2 mL; Refill: 0  2. Other depression with emotional eating Felicia Franklin will continue Wellbutrin SR 150 mg BID, and we will refill for 1 month.   - buPROPion (WELLBUTRIN SR) 150 MG 12 hr tablet; Take 1 tablet (150 mg total) by mouth 2 (two) times daily.  Dispense: 60 tablet; Refill: 0  3. Obesity, Current BMI 32.8 Felicia Franklin is currently in the action stage of change. As such, her goal is to continue with weight loss efforts. She has agreed to the Category 2 Plan.   Exercise goals: As is.   Behavioral modification strategies: increasing lean protein intake, holiday eating strategies ,  and celebration eating strategies.  Felicia Franklin has agreed to follow-up with our clinic in 4 weeks. She was informed of the importance of frequent follow-up visits to maximize her success with intensive lifestyle modifications for her multiple health conditions.   Objective:   Blood pressure (!) 92/54, pulse 90, temperature 98.6 F (37 C), height 5\' 2"  (1.575 m), weight 179 lb (81.2 kg), last menstrual period 10/03/1998, SpO2 97 %. Body mass index is 32.74 kg/m.  General: Cooperative, alert, well developed, in no acute distress. HEENT: Conjunctivae and lids unremarkable. Cardiovascular: Regular rhythm.  Lungs: Normal work of breathing. Neurologic: No focal deficits.   Lab Results  Component Value Date   CREATININE 0.94 04/18/2022   BUN 17 04/18/2022   NA 134 04/18/2022   K 4.7 04/18/2022   CL 99 04/18/2022   CO2 20 04/18/2022   Lab Results  Component Value Date   ALT 17 04/18/2022   AST 20 04/18/2022   ALKPHOS 109 04/18/2022   BILITOT 0.7 04/18/2022   Lab Results  Component Value Date   HGBA1C 5.2 04/18/2022   HGBA1C 5.5 07/12/2021   HGBA1C 5.6 10/08/2020   Lab Results  Component Value Date   INSULIN 10.5 04/18/2022   INSULIN 22.3 07/12/2021   INSULIN 23.3 10/08/2020   Lab Results  Component Value Date   TSH 2.190 04/18/2022   Lab Results  Component Value Date   CHOL 166 04/18/2022   HDL 61 04/18/2022   LDLCALC 88 04/18/2022   TRIG 93 04/18/2022   Lab  Results  Component Value Date   VD25OH 66.9 04/18/2022   VD25OH 52.1 07/12/2021   VD25OH 66.0 10/08/2020   Lab Results  Component Value Date   WBC 5.9 04/18/2022   HGB 13.1 04/18/2022   HCT 39.5 04/18/2022   MCV 91 04/18/2022   PLT 255 04/18/2022   No results found for: "IRON", "TIBC", "FERRITIN"  Attestation Statements:   Reviewed by clinician on day of visit: allergies, medications, problem list, medical history, surgical history, family history, social history, and previous encounter notes.   I,  Burt Knack, am acting as transcriptionist for Quillian Quince, MD.  I have reviewed the above documentation for accuracy and completeness, and I agree with the above. -  Quillian Quince, MD

## 2022-10-14 DIAGNOSIS — I10 Essential (primary) hypertension: Secondary | ICD-10-CM | POA: Diagnosis not present

## 2022-10-14 DIAGNOSIS — E785 Hyperlipidemia, unspecified: Secondary | ICD-10-CM | POA: Diagnosis not present

## 2022-10-18 ENCOUNTER — Telehealth: Payer: Self-pay | Admitting: Cardiovascular Disease

## 2022-10-18 DIAGNOSIS — I480 Paroxysmal atrial fibrillation: Secondary | ICD-10-CM

## 2022-10-18 MED ORDER — APIXABAN 5 MG PO TABS: ORAL_TABLET | ORAL | 1 refills | Status: DC

## 2022-10-18 NOTE — Telephone Encounter (Signed)
*  STAT* If patient is at the pharmacy, call can be transferred to refill team.   1. Which medications need to be refilled? (please list name of each medication and dose if known)  ELIQUIS 5 MG TABS tablet  2. Which pharmacy/location (including street and city if local pharmacy) is medication to be sent to? [id number] - [place], [place] [address] AT [place]  3. Do they need a 30 day or 90 day supply?  90 day supply

## 2022-10-18 NOTE — Telephone Encounter (Signed)
Please review for refill. Thank you! 

## 2022-10-18 NOTE — Telephone Encounter (Signed)
Eliquis 5mg  refill request received. Patient is 76 years old, weight-81.2kg, Crea-0.94 on 04/18/2022, Diagnosis-Afib, and last seen by Dr. Oval Linsey on 03/17/2022. Dose is appropriate based on dosing criteria. Will send in refill to requested pharmacy.

## 2022-10-19 DIAGNOSIS — I480 Paroxysmal atrial fibrillation: Secondary | ICD-10-CM | POA: Diagnosis not present

## 2022-10-19 DIAGNOSIS — I872 Venous insufficiency (chronic) (peripheral): Secondary | ICD-10-CM | POA: Diagnosis not present

## 2022-10-19 DIAGNOSIS — E785 Hyperlipidemia, unspecified: Secondary | ICD-10-CM | POA: Diagnosis not present

## 2022-10-19 DIAGNOSIS — Z1339 Encounter for screening examination for other mental health and behavioral disorders: Secondary | ICD-10-CM | POA: Diagnosis not present

## 2022-10-19 DIAGNOSIS — R82998 Other abnormal findings in urine: Secondary | ICD-10-CM | POA: Diagnosis not present

## 2022-10-19 DIAGNOSIS — E669 Obesity, unspecified: Secondary | ICD-10-CM | POA: Diagnosis not present

## 2022-10-19 DIAGNOSIS — I10 Essential (primary) hypertension: Secondary | ICD-10-CM | POA: Diagnosis not present

## 2022-10-19 DIAGNOSIS — Z1331 Encounter for screening for depression: Secondary | ICD-10-CM | POA: Diagnosis not present

## 2022-10-19 DIAGNOSIS — Z0000 Encounter for general adult medical examination without abnormal findings: Secondary | ICD-10-CM | POA: Diagnosis not present

## 2022-10-20 ENCOUNTER — Encounter (INDEPENDENT_AMBULATORY_CARE_PROVIDER_SITE_OTHER): Payer: Self-pay | Admitting: Family Medicine

## 2022-10-20 ENCOUNTER — Other Ambulatory Visit (INDEPENDENT_AMBULATORY_CARE_PROVIDER_SITE_OTHER): Payer: Self-pay | Admitting: Family Medicine

## 2022-10-20 ENCOUNTER — Ambulatory Visit (INDEPENDENT_AMBULATORY_CARE_PROVIDER_SITE_OTHER): Payer: Medicare PPO | Admitting: Family Medicine

## 2022-10-20 VITALS — BP 122/69 | HR 66 | Temp 98.0°F | Ht 62.0 in

## 2022-10-20 DIAGNOSIS — Z6832 Body mass index (BMI) 32.0-32.9, adult: Secondary | ICD-10-CM

## 2022-10-20 DIAGNOSIS — R7303 Prediabetes: Secondary | ICD-10-CM

## 2022-10-20 DIAGNOSIS — E669 Obesity, unspecified: Secondary | ICD-10-CM

## 2022-10-20 DIAGNOSIS — F3289 Other specified depressive episodes: Secondary | ICD-10-CM

## 2022-10-20 MED ORDER — METFORMIN HCL 500 MG PO TABS: 500.0000 mg | ORAL_TABLET | Freq: Every day | ORAL | 0 refills | Status: DC

## 2022-10-20 MED ORDER — BUPROPION HCL ER (SR) 150 MG PO TB12: 150.0000 mg | ORAL_TABLET | Freq: Two times a day (BID) | ORAL | 0 refills | Status: DC

## 2022-10-21 ENCOUNTER — Ambulatory Visit (HOSPITAL_BASED_OUTPATIENT_CLINIC_OR_DEPARTMENT_OTHER): Payer: Medicare PPO | Admitting: Family

## 2022-10-21 ENCOUNTER — Encounter (HOSPITAL_BASED_OUTPATIENT_CLINIC_OR_DEPARTMENT_OTHER): Payer: Self-pay | Admitting: Family

## 2022-10-21 VITALS — BP 132/72 | HR 75 | Ht 62.0 in | Wt 183.0 lb

## 2022-10-21 DIAGNOSIS — E782 Mixed hyperlipidemia: Secondary | ICD-10-CM | POA: Diagnosis not present

## 2022-10-21 DIAGNOSIS — I480 Paroxysmal atrial fibrillation: Secondary | ICD-10-CM | POA: Diagnosis not present

## 2022-10-21 DIAGNOSIS — I10 Essential (primary) hypertension: Secondary | ICD-10-CM

## 2022-10-21 DIAGNOSIS — G4733 Obstructive sleep apnea (adult) (pediatric): Secondary | ICD-10-CM | POA: Diagnosis not present

## 2022-10-21 NOTE — Progress Notes (Unsigned)
Office Visit    Patient Name: Felicia Franklin Date of Encounter: 10/26/2022  PCP:  Burnard Bunting, MD   [place]  Cardiologist:  Skeet Latch, MD  Advanced Practice Provider:  No care team member to display Electrophysiologist:  None     Chief Complaint    Felicia Franklin is a 77 y.o. female presents today for follow-up of hypertension  Past Medical History    Past Medical History:  Diagnosis Date   Anxiety    Anxiety    Arthritis    Back pain    Constipation    Essential hypertension    Hyperlipidemia    Joint pain    Lightheadedness 03/17/2022   Lower back pain    Osteoarthritis    Other fatigue    Paroxysmal atrial fibrillation (HCC)    PONV (postoperative nausea and vomiting)    severe-also can get agitated waking up   Shortness of breath on exertion    Sleep apnea    Wears glasses    Past Surgical History:  Procedure Laterality Date   BUNIONECTOMY     CARPAL TUNNEL RELEASE Left 2007   CARPAL TUNNEL RELEASE Right 1992   CARPOMETACARPAL ([place]) FUSION OF THUMB Right 11/26/2013   Procedure: CARPOMETACARPAL ([place]) FUSION OF THUMB;  Surgeon: Cammie Sickle., MD;  Location: [place];  Service: Orthopedics;  Laterality: Right;   COLONOSCOPY     FOOT SURGERY Left 2005   bunionectomy-osteotomy   HAMMER TOE SURGERY      Allergies  Allergies  Allergen Reactions   Triple Antibiotic [Bacitracin-Neomycin-Polymyxin] Rash    History of Present Illness    Felicia Franklin is a 77 y.o. female with a hx of hypertension, hyperlipidemia, paroxysmal atrial fibrillation, arthritis, sleep apnea, anxiety last seen 03/17/22.  Diagnosed with atrial fibrillation in 2014.  Initially evaluated 07/2021 with dyspnea with echocardiogram revealing LVEF 63%.  Nuclear stress test 09/2021 LVEF 67%, no ischemia.  Last seen 03/2022.  She was exercising at the [facility] 5-6 times per week.  Blood pressure at home was well-controlled less than  120/70.  She was not using her CPAP routinely because it woke her up.  She was having asymptomatic episodes of atrial fibrillation.  Intermittent episodes of lightheadedness which recommended for monitoring and if increased in frequency plan for ambulatory monitor.  Presents today for follow-up independently.  Continues to participate in water aerobics 5 times per week at the [facility].  She had labs recently with her primary care provider, detailed below.  She notes no recurrent episodes of lightheadedness since the summertime.  Feeling overall well with no chest pain, no new exertional dyspnea.  Labs 10/04/22 Total cholesterol 165, triglycerides 57, HDL 63, LDL 91 Creatitine 0.9, GFR 82 Hb 12.7, Plt 245  EKGs/Labs/Other Studies Reviewed:   The following studies were reviewed today:  EKG:  EKG is not ordered today.   Recent Labs: 04/18/2022: ALT 17; BUN 17; Creatinine, Ser 0.94; Hemoglobin 13.1; Platelets 255; Potassium 4.7; Sodium 134; TSH 2.190  Recent Lipid Panel    Component Value Date/Time   CHOL 166 04/18/2022 0846   TRIG 93 04/18/2022 0846   HDL 61 04/18/2022 0846   LDLCALC 88 04/18/2022 0846    Risk Assessment/Calculations:   CHA2DS2-VASc Score = 4   This indicates a 4.8% annual risk of stroke. The patient's score is based upon: CHF History: 0 HTN History: 1 Diabetes History: 0 Stroke History: 0 Vascular Disease History: 0  Age Score: 2 Gender Score: 1     Home Medications   Current Meds  Medication Sig   aliskiren (TEKTURNA) 300 MG tablet Take 300 mg by mouth daily.   amLODipine (NORVASC) 10 MG tablet Take 10 mg by mouth daily.   apixaban (ELIQUIS) 5 MG TABS tablet TAKE 1 TABLET(5 MG) BY MOUTH TWICE DAILY   ascorbic acid (VITAMIN C) 500 MG tablet Take 500 mg by mouth daily.   buPROPion (WELLBUTRIN SR) 150 MG 12 hr tablet Take 1 tablet (150 mg total) by mouth 2 (two) times daily.   Collagen-Boron-Hyaluronic Acid (MOVE FREE ULTRA JOINT HEALTH PO) Take 1 tablet by  mouth daily.   Docusate Sodium (COLACE PO) Take by mouth.   metFORMIN (GLUCOPHAGE) 500 MG tablet Take 1 tablet (500 mg total) by mouth daily with breakfast.   Multiple Vitamins-Minerals (CENTRUM SILVER PO) Take 1 tablet by mouth daily.   nebivolol (BYSTOLIC) 10 MG tablet Take 10 mg by mouth daily.   Polyethylene Glycol 3350 (MIRALAX PO) Take by mouth daily.    simvastatin (ZOCOR) 40 MG tablet Take 40 mg by mouth every evening.   spironolactone (ALDACTONE) 25 MG tablet TAKE 1 TABLET BY MOUTH EVERY MORNING     Review of Systems      All other systems reviewed and are otherwise negative except as noted above.  Physical Exam    VS:  BP 132/72   Pulse 75   Ht 5\' 2"  (1.575 m)   Wt 183 lb (83 kg)   LMP 10/03/1998   BMI 33.47 kg/m  , BMI Body mass index is 33.47 kg/m.  Wt Readings from Last 3 Encounters:  10/21/22 183 lb (83 kg)  09/12/22 179 lb (81.2 kg)  08/11/22 180 lb 6.4 oz (81.8 kg)    GEN: Well nourished, well developed, in no acute distress. HEENT: normal. Neck: Supple, no JVD, carotid bruits, or masses. Cardiac: RRR, no murmurs, rubs, or gallops. No clubbing, cyanosis, edema.  Radials/PT 2+ and equal bilaterally.  Respiratory:  Respirations regular and unlabored, clear to auscultation bilaterally. GI: Soft, nontender, nondistended. MS: No deformity or atrophy. Skin: Warm and dry, no rash. Neuro:  Strength and sensation are intact. Psych: Normal affect.  Assessment & Plan    PAF /hypercoagulable state = asymptomatic in regards to atrial fibrillation.  Regular by auscultation today.  Continue Eliquis 5 mg twice daily, Bystolic 10 mg daily.  Does not meet dose reduction criteria for Eliquis.  Denies bleeding complications.  HTN -BP mildly elevated in clinic but reports well-controlled at home.  Continue present antihypertensive regimen. Discussed to monitor BP at home at least 2 hours after medications and sitting for 5-10 minutes.   OSA - Has not tolerated CPAP  HLD  -continue simvastatin 40 mg daily.  Recent LDL at goal of less than 100.  LIghtheadedness - No recurrence.  No indication for further workup at this time.        Disposition: Follow up in 9 month(s) with Skeet Latch, MD or APP.  Signed, Loel Dubonnet, NP 10/26/2022, 2:49 PM [place]

## 2022-10-21 NOTE — Patient Instructions (Signed)
Medication Instructions:  Your Physician recommend you continue on your current medication as directed.    *If you need a refill on your cardiac medications before your next appointment, please call your pharmacy*   Follow-Up: At [facility], you and your health needs are our priority.  As part of our continuing mission to provide you with exceptional heart care, we have created designated Provider Care Teams.  These Care Teams include your primary Cardiologist (physician) and Advanced Practice Providers (APPs -  Physician Assistants and Nurse Practitioners) who all work together to provide you with the care you need, when you need it.  We recommend signing up for the patient portal called "MyChart".  Sign up information is provided on this After Visit Summary.  MyChart is used to connect with patients for Virtual Visits (Telemedicine).  Patients are able to view lab/test results, encounter notes, upcoming appointments, etc.  Non-urgent messages can be sent to your provider as well.   To learn more about what you can do with MyChart, go to [email or web address].    Your next appointment:   9 month(s)  Provider:   Skeet Latch, MD

## 2022-10-25 NOTE — Progress Notes (Unsigned)
     Chief Complaint:   OBESITY Felicia Franklin is here to discuss her progress with her obesity treatment plan along with follow-up of her obesity related diagnoses. Lively is on {MWMwtlossportion/plan2:23431} and states she is following her eating plan approximately ***% of the time. Beverlee states she is *** *** minutes *** times per week.  Today's visit was #: *** Starting weight: *** Starting date: *** Today's weight: *** Today's date: 10/20/2022 Total lbs lost to date: *** Total lbs lost since last in-office visit: ***  Interim History: ***  Subjective:   1. Prediabetes ***  2. Emotional Eating Behavior ***  Assessment/Plan:   1. Prediabetes *** - metFORMIN (GLUCOPHAGE) 500 MG tablet; Take 1 tablet (500 mg total) by mouth daily with breakfast.  Dispense: 30 tablet; Refill: 0  2. Emotional Eating Behavior *** - buPROPion (WELLBUTRIN SR) 150 MG 12 hr tablet; Take 1 tablet (150 mg total) by mouth 2 (two) times daily.  Dispense: 60 tablet; Refill: 0  3. BMI 32.0-32.9,adult  4. Obesity, Beginning BMI 36.21 Felicia Franklin is currently in the action stage of change. As such, her goal is to continue with weight loss efforts. She has agreed to the Category 2 Plan.   Exercise goals: As is.   Behavioral modification strategies: increasing lean protein intake.  Felicia Franklin has agreed to follow-up with our clinic in 4 weeks. She was informed of the importance of frequent follow-up visits to maximize her success with intensive lifestyle modifications for her multiple health conditions.   Objective:   Blood pressure 122/69, pulse 66, temperature 98 F (36.7 C), height 5\' 2"  (1.575 m), last menstrual period 10/03/1998, SpO2 96 %. Body mass index is 32.74 kg/m.  General: Cooperative, alert, well developed, in no acute distress. HEENT: Conjunctivae and lids unremarkable. Cardiovascular: Regular rhythm.  Lungs: Normal work of breathing. Neurologic: No focal deficits.   Lab Results  Component Value  Date   CREATININE 0.94 04/18/2022   BUN 17 04/18/2022   NA 134 04/18/2022   K 4.7 04/18/2022   CL 99 04/18/2022   CO2 20 04/18/2022   Lab Results  Component Value Date   ALT 17 04/18/2022   AST 20 04/18/2022   ALKPHOS 109 04/18/2022   BILITOT 0.7 04/18/2022   Lab Results  Component Value Date   HGBA1C 5.2 04/18/2022   HGBA1C 5.5 07/12/2021   HGBA1C 5.6 10/08/2020   Lab Results  Component Value Date   INSULIN 10.5 04/18/2022   INSULIN 22.3 07/12/2021   INSULIN 23.3 10/08/2020   Lab Results  Component Value Date   TSH 2.190 04/18/2022   Lab Results  Component Value Date   CHOL 166 04/18/2022   HDL 61 04/18/2022   LDLCALC 88 04/18/2022   TRIG 93 04/18/2022   Lab Results  Component Value Date   VD25OH 66.9 04/18/2022   VD25OH 52.1 07/12/2021   VD25OH 66.0 10/08/2020   Lab Results  Component Value Date   WBC 5.9 04/18/2022   HGB 13.1 04/18/2022   HCT 39.5 04/18/2022   MCV 91 04/18/2022   PLT 255 04/18/2022   No results found for: "IRON", "TIBC", "FERRITIN"  Attestation Statements:   Reviewed by clinician on day of visit: allergies, medications, problem list, medical history, surgical history, family history, social history, and previous encounter notes.   I, Trixie Dredge, am acting as transcriptionist for Dennard Nip, MD.  I have reviewed the above documentation for accuracy and completeness, and I agree with the above. -  ***

## 2022-10-26 ENCOUNTER — Encounter (HOSPITAL_BASED_OUTPATIENT_CLINIC_OR_DEPARTMENT_OTHER): Payer: Self-pay | Admitting: Family

## 2022-10-27 ENCOUNTER — Telehealth: Payer: Self-pay | Admitting: Cardiovascular Disease

## 2022-10-27 ENCOUNTER — Encounter: Payer: Self-pay | Admitting: *Deleted

## 2022-10-27 DIAGNOSIS — I480 Paroxysmal atrial fibrillation: Secondary | ICD-10-CM

## 2022-10-27 DIAGNOSIS — R42 Dizziness and giddiness: Secondary | ICD-10-CM

## 2022-10-27 NOTE — Progress Notes (Unsigned)
Patient enrolled for [employer] to ship a 14 day cardiac event monitor to her address on file.  Dr. Oval Linsey to read.

## 2022-10-27 NOTE — Telephone Encounter (Signed)
Spoke with patient regarding lightheaded spells she has been having.  Stated Sunday she sat down to have breakfast and she had multiple episodes of lightheadedness over 30 min time frame.  Tuesday, Wednesday, and Thursday morning happened again but only once, all while eating breakfast Each episode lasts only a few seconds She did not have her Apple watch on to see HR or if in Afib She can check her watch to see if any episodes of Afib over the last month however none of the episodes was she wearing the watch. Denies feeling like her hear was beating fast  Is not able to check blood pressure at home Takes Metformin but stated it is for diet, is not diabetic and does not check her blood sugar  Saw PCP last week, labs unremarkable  Patient mentioned discussing monitor with Overton Mam NP at her last visit   Would like Caitlin's recommendations, will forward for review

## 2022-10-27 NOTE — Telephone Encounter (Signed)
She was not having lightheadedness at our recent OV. As it has recurred, recommend mail 14 day ZIO monitor per Dr. Judeth Cornfield previous recommendations.   TY! Loel Dubonnet, NP

## 2022-10-27 NOTE — Telephone Encounter (Signed)
Advised patient of recommendations.  Patient stated she does water exercises which is her only form of exercise and does not want to go 2 weeks without doing Discussed with Overton Mam NP and ok to change to [employer] so patient can use while in pool  Order placed and mychart message sent with instructions

## 2022-10-27 NOTE — Telephone Encounter (Signed)
STAT if patient feels like he/she is going to faint   Are you dizzy now? No. Earlier this morning. States she has been waking up for the past week feeling lightheaded.   Do you feel faint or have you passed out? No.   Do you have any other symptoms? No  Have you checked your HR and BP (record if available)? No.

## 2022-11-01 ENCOUNTER — Ambulatory Visit: Payer: Medicare PPO | Attending: Family

## 2022-11-01 DIAGNOSIS — I480 Paroxysmal atrial fibrillation: Secondary | ICD-10-CM

## 2022-11-01 DIAGNOSIS — R42 Dizziness and giddiness: Secondary | ICD-10-CM

## 2022-11-09 ENCOUNTER — Telehealth: Payer: Self-pay | Admitting: Internal Medicine

## 2022-11-10 ENCOUNTER — Telehealth (HOSPITAL_BASED_OUTPATIENT_CLINIC_OR_DEPARTMENT_OTHER): Payer: Self-pay | Admitting: *Deleted

## 2022-11-10 NOTE — Telephone Encounter (Signed)
Received critical notification from [place] for 2/7 6:41 pm CT, AFib with HR of 100 6:42 pm HR 90 Patient has know PAF and is currently taking Eliquis. Strips given to Catilin W NP for review.

## 2022-11-10 NOTE — Telephone Encounter (Signed)
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 Advised patient the Afib was at 6:04 pm, didn't feel any different at that time Scheduled follow up 2/20 with Overton Mam NP to discuss results

## 2022-11-10 NOTE — Telephone Encounter (Signed)
   Cardiac Monitor Alert  Date of alert:  11/10/2022   Patient Name: Felicia Franklin  DOB: 10-15-1945  MRN: [id number]   [place] Cardiologist: Skeet Latch, MD  [facility] EP:  None    Monitor Information: Cardiac Event Monitor [[employer]]  Reason:  atrial fib Ordering provider:  Loel Dubonnet, NP    Alert Atrial Fibrillation/Flutter This is the 1st alert for this rhythm.  The patient has a hx of Atrial Fibrillation/Flutter.    Anticoagulation medication as of 11/10/2022           apixaban (ELIQUIS) 5 MG TABS tablet TAKE 1 TABLET(5 MG) BY MOUTH TWICE DAILY       Next Cardiology Appointment   Date:  07/2023  Provider:  Loel Dubonnet, NP  Can be scheduled sooner pending final results of monitor.  Arrhythmia, symptoms and history reviewed .    Plan:  Continue Eliquis 5mg  BID, Bystolic 10mg  QD. Rhythm report shows rate controlled atrial fibrillation.  Will ask nursing team to reach out to her just to confirm she is feeling well - no symptoms noted by [employer] at time of event (11/09/22 at 6:40PM).   Loel Dubonnet, NP  11/10/2022 9:50 AM

## 2022-11-10 NOTE — Telephone Encounter (Signed)
Patient was returning call. Please call back on [phone]

## 2022-11-10 NOTE — Telephone Encounter (Signed)
----   Message from Loel Dubonnet, NP sent at 11/10/2022  9:55 AM EST -----  Can we just call her to let her know monitor did show afib 2/7 at 6:40PM to confirm whether she had any symptoms at that time? No need for change in medication at this time. If she wishes to go ahead and schedule an OV for about a month from now to review monitor report we can, otherwise we can wait til monitor results to determine if OV needed.    Left message to call back

## 2022-11-10 NOTE — Telephone Encounter (Signed)
Patient is returning call. Requesting return call.  

## 2022-11-10 NOTE — Telephone Encounter (Signed)
Left message to call back  

## 2022-11-17 ENCOUNTER — Encounter (INDEPENDENT_AMBULATORY_CARE_PROVIDER_SITE_OTHER): Payer: Self-pay | Admitting: Family Medicine

## 2022-11-17 ENCOUNTER — Ambulatory Visit (INDEPENDENT_AMBULATORY_CARE_PROVIDER_SITE_OTHER): Payer: Medicare PPO | Admitting: Family Medicine

## 2022-11-17 ENCOUNTER — Other Ambulatory Visit (INDEPENDENT_AMBULATORY_CARE_PROVIDER_SITE_OTHER): Payer: Self-pay | Admitting: Family Medicine

## 2022-11-17 VITALS — BP 119/74 | HR 68 | Temp 98.2°F | Ht 62.0 in | Wt 183.0 lb

## 2022-11-17 DIAGNOSIS — R7303 Prediabetes: Secondary | ICD-10-CM | POA: Diagnosis not present

## 2022-11-17 DIAGNOSIS — E669 Obesity, unspecified: Secondary | ICD-10-CM | POA: Diagnosis not present

## 2022-11-17 DIAGNOSIS — Z6833 Body mass index (BMI) 33.0-33.9, adult: Secondary | ICD-10-CM | POA: Insufficient documentation

## 2022-11-17 DIAGNOSIS — F3289 Other specified depressive episodes: Secondary | ICD-10-CM

## 2022-11-17 DIAGNOSIS — Z6834 Body mass index (BMI) 34.0-34.9, adult: Secondary | ICD-10-CM | POA: Insufficient documentation

## 2022-11-17 MED ORDER — BUPROPION HCL ER (SR) 150 MG PO TB12: 150.0000 mg | ORAL_TABLET | Freq: Two times a day (BID) | ORAL | 0 refills | Status: DC

## 2022-11-17 MED ORDER — METFORMIN HCL 500 MG PO TABS: 500.0000 mg | ORAL_TABLET | Freq: Two times a day (BID) | ORAL | 0 refills | Status: DC

## 2022-11-22 ENCOUNTER — Ambulatory Visit (HOSPITAL_BASED_OUTPATIENT_CLINIC_OR_DEPARTMENT_OTHER): Payer: Medicare PPO | Admitting: Family

## 2022-11-22 ENCOUNTER — Encounter (HOSPITAL_BASED_OUTPATIENT_CLINIC_OR_DEPARTMENT_OTHER): Payer: Self-pay | Admitting: Family

## 2022-11-22 VITALS — BP 142/80 | HR 67 | Ht 62.0 in | Wt 188.0 lb

## 2022-11-22 DIAGNOSIS — E782 Mixed hyperlipidemia: Secondary | ICD-10-CM | POA: Diagnosis not present

## 2022-11-22 DIAGNOSIS — D6859 Other primary thrombophilia: Secondary | ICD-10-CM

## 2022-11-22 DIAGNOSIS — G4733 Obstructive sleep apnea (adult) (pediatric): Secondary | ICD-10-CM | POA: Diagnosis not present

## 2022-11-22 DIAGNOSIS — I480 Paroxysmal atrial fibrillation: Secondary | ICD-10-CM

## 2022-11-22 DIAGNOSIS — I10 Essential (primary) hypertension: Secondary | ICD-10-CM

## 2022-11-22 MED ORDER — NEBIVOLOL HCL 20 MG PO TABS: 1.0000 | ORAL_TABLET | Freq: Every day | ORAL | 1 refills | Status: DC

## 2022-11-22 NOTE — Patient Instructions (Addendum)
Medication Instructions:  Your physician has recommended you make the following change in your medication:   INCREASE Bystolic to [id number] daily  *If you need a refill on your cardiac medications before your next appointment, please call your pharmacy*   Lab Work/Testing/Procedures: Your monitor showed you were only in atrial fibrillation 21% of the time and your heart rate was well controlled. There were no dangerous heart rhythms.   Follow-Up: At [facility], you and your health needs are our priority.  As part of our continuing mission to provide you with exceptional heart care, we have created designated Provider Care Teams.  These Care Teams include your primary Cardiologist (physician) and Advanced Practice Providers (APPs -  Physician Assistants and Nurse Practitioners) who all work together to provide you with the care you need, when you need it.  We recommend signing up for the patient portal called "MyChart".  Sign up information is provided on this After Visit Summary.  MyChart is used to connect with patients for Virtual Visits (Telemedicine).  Patients are able to view lab/test results, encounter notes, upcoming appointments, etc.  Non-urgent messages can be sent to your provider as well.   To learn more about what you can do with MyChart, go to [email or web address].    Your next appointment:   In 4 months with Dr. Oval Linsey or Loel Dubonnet, NP   Other Instructions  Tips to Measure your Blood Pressure Correctly  Recommend purchase home blood pressure cuff. Upper arm Omron monitor is a good brand. It should cost around $35.  Here's what you can do to ensure a correct reading:  Don't drink a caffeinated beverage or smoke during the 30 minutes before the test.  Sit quietly for five minutes before the test begins.  During the measurement, sit in a chair with your feet on the floor and your arm supported so your elbow is at about heart level.  The inflatable part of  the cuff should completely cover at least 80% of your upper arm, and the cuff should be placed on bare skin, not over a shirt.  Don't talk during the measurement.   Blood pressure categories  Blood pressure category SYSTOLIC (upper number)  DIASTOLIC (lower number)  Normal Less than 120 mm Hg and Less than 80 mm Hg  Elevated 120-129 mm Hg and Less than 80 mm Hg  High blood pressure: Stage 1 hypertension 130-139 mm Hg or 80-89 mm Hg  High blood pressure: Stage 2 hypertension 140 mm Hg or higher or 90 mm Hg or higher  Hypertensive crisis (consult your doctor immediately) Higher than 180 mm Hg and/or Higher than 120 mm Hg  Source: American Heart Association and American Stroke Association. For more on getting your blood pressure under control, buy Controlling Your Blood Pressure, a Special Health Report from [facility].   Blood Pressure Log   Date   Time  Blood Pressure  Example: Nov 1 9 AM 124/78

## 2022-11-22 NOTE — Progress Notes (Signed)
Office Visit    Patient Name: Felicia Franklin Date of Encounter: 11/22/2022  PCP:  Burnard Bunting, MD   [place]  Cardiologist:  Skeet Latch, MD  Advanced Practice Provider:  No care team member to display Electrophysiologist:  None     Chief Complaint    [name] is a 77 y.o. female presents today for follow-up after monitor.   Past Medical History    Past Medical History:  Diagnosis Date   Anxiety    Anxiety    Arthritis    Back pain    Constipation    Essential hypertension    Hyperlipidemia    Joint pain    Lightheadedness 03/17/2022   Lower back pain    Osteoarthritis    Other fatigue    Paroxysmal atrial fibrillation (HCC)    PONV (postoperative nausea and vomiting)    severe-also can get agitated waking up   Shortness of breath on exertion    Sleep apnea    Wears glasses    Past Surgical History:  Procedure Laterality Date   BUNIONECTOMY     CARPAL TUNNEL RELEASE Left 2007   CARPAL TUNNEL RELEASE Right 1992   CARPOMETACARPAL ([place]) FUSION OF THUMB Right 11/26/2013   Procedure: CARPOMETACARPAL ([place]) FUSION OF THUMB;  Surgeon: Cammie Sickle., MD;  Location: [facility];  Service: Orthopedics;  Laterality: Right;   COLONOSCOPY     FOOT SURGERY Left 2005   bunionectomy-osteotomy   HAMMER TOE SURGERY      Allergies  Allergies  Allergen Reactions   Triple Antibiotic [Bacitracin-Neomycin-Polymyxin] Rash    History of Present Illness    Felicia Franklin is a 77 y.o. female with a hx of hypertension, hyperlipidemia, paroxysmal atrial fibrillation, arthritis, sleep apnea, anxiety last seen 10/21/22.  Diagnosed with atrial fibrillation in 2014.  Initially evaluated 07/2021 with dyspnea with echocardiogram revealing LVEF 63%.  Nuclear stress test 09/2021 LVEF 67%, no ischemia.  At visit 03/2022 noted occasional episode of lightheadedness recommended for ambulatory monitor if increased symptoms. Last  seen 10/21/22 with BP mildly elevated recommended for home monitoring. She had no recurrent lightheadedness though a week later noted recurrent episodes and ambulatory monitor ordered.  Presents today for follow up. Participating in water aerobics 5-6x/week ath the [employer]. Preliminary monitor with 21% atrial fibrillation burden which was rate controlled. One triggered episode sinus bradycardia 57 bpm with other accidental push NSR. Notes she had difficulty triggering episodes and keeping up with the diary. Since wearing the monitor reports only occasional episodes of lightheadedness/dizziness but that she describes as she is "blinking out" almost as if she's had a sip of a cold drink with a funny feeling in her chest. No exertional chest discomfort. No edema, dyspnea, orthopnea, PND, near syncope, syncope.   Labs 10/04/22 via [facility] Total cholesterol 165, triglycerides 57, HDL 63, LDL 91 Creatitine 0.9, GFR 82 Hb 12.7, Plt 245  EKGs/Labs/Other Studies Reviewed:   The following studies were reviewed today:  EKG:  EKG is not ordered today.   Recent Labs: 04/18/2022: ALT 17; BUN 17; Creatinine, Ser 0.94; Hemoglobin 13.1; Platelets 255; Potassium 4.7; Sodium 134; TSH 2.190  Recent Lipid Panel    Component Value Date/Time   CHOL 166 04/18/2022 0846   TRIG 93 04/18/2022 0846   HDL 61 04/18/2022 0846   LDLCALC 88 04/18/2022 0846    Risk Assessment/Calculations:   CHA2DS2-VASc Score = 4   This indicates a 4.8% annual  risk of stroke. The patient's score is based upon: CHF History: 0 HTN History: 1 Diabetes History: 0 Stroke History: 0 Vascular Disease History: 0 Age Score: 2 Gender Score: 1     Home Medications   Current Meds  Medication Sig   aliskiren (TEKTURNA) 300 MG tablet Take 300 mg by mouth daily.   amLODipine (NORVASC) 10 MG tablet Take 10 mg by mouth daily.   apixaban (ELIQUIS) 5 MG TABS tablet TAKE 1 TABLET(5 MG) BY MOUTH TWICE DAILY   ascorbic acid (VITAMIN C) 500 MG tablet  Take 500 mg by mouth daily.   buPROPion (WELLBUTRIN SR) 150 MG 12 hr tablet Take 1 tablet (150 mg total) by mouth 2 (two) times daily.   Collagen-Boron-Hyaluronic Acid (MOVE FREE ULTRA JOINT HEALTH PO) Take 1 tablet by mouth daily.   Docusate Sodium (COLACE PO) Take by mouth.   metFORMIN (GLUCOPHAGE) 500 MG tablet Take 1 tablet (500 mg total) by mouth 2 (two) times daily with a meal.   Multiple Vitamins-Minerals (CENTRUM SILVER PO) Take 1 tablet by mouth daily.   Nebivolol HCl (BYSTOLIC) 20 MG TABS Take 1 tablet (20 mg total) by mouth daily.   Polyethylene Glycol 3350 (MIRALAX PO) Take by mouth daily.    simvastatin (ZOCOR) 40 MG tablet Take 40 mg by mouth every evening.   spironolactone (ALDACTONE) 25 MG tablet TAKE 1 TABLET BY MOUTH EVERY MORNING   [DISCONTINUED] nebivolol (BYSTOLIC) 10 MG tablet Take 10 mg by mouth daily.     Review of Systems      All other systems reviewed and are otherwise negative except as noted above.  Physical Exam    VS:  BP (!) 142/80   Pulse 67   Ht 5' 2"$  (1.575 m)   Wt 188 lb (85.3 kg)   LMP 10/03/1998   BMI 34.39 kg/m  , BMI Body mass index is 34.39 kg/m.  Wt Readings from Last 3 Encounters:  11/22/22 188 lb (85.3 kg)  11/17/22 183 lb (83 kg)  10/21/22 183 lb (83 kg)    GEN: Well nourished, well developed, in no acute distress. HEENT: normal. Neck: Supple, no JVD, carotid bruits, or masses. Cardiac: RRR, no murmurs, rubs, or gallops. No clubbing, cyanosis, edema.  Radials/PT 2+ and equal bilaterally.  Respiratory:  Respirations regular and unlabored, clear to auscultation bilaterally. GI: Soft, nontender, nondistended. MS: No deformity or atrophy. Skin: Warm and dry, no rash. Neuro:  Strength and sensation are intact. Psych: Normal affect.  Assessment & Plan    PAF /hypercoagulable state - Preliminary monitor report with 21% burden which is overall rate controlled. Regular by auscultation today.  Continue Eliquis 5 mg twice daily. She  cannot necessarily distinguish when she is atrial fib vs NSR. She has brief periodic episodes of lightheadedness. Concern this could reflect her occasionally elevated rates, will increase Bystolic to [id number] QD.Marland Kitchen  Does not meet dose reduction criteria for Eliquis.  Denies bleeding complications.  HTN -BP mildly elevated in clinic. Increase Bystolic from [id number] to [id number]. Discussed to monitor BP at home at least 2 hours after medications and sitting for 5-10 minutes.   OSA - Has not tolerated CPAP previously.  HLD -continue simvastatin 40 mg daily.  Recent LDL at goal of less than 100.    Disposition: Follow up  07/2023  with Skeet Latch, MD or APP.  Signed, Loel Dubonnet, NP 11/22/2022, 7:51 PM [place]

## 2022-12-05 NOTE — Progress Notes (Signed)
Chief Complaint:   OBESITY Felicia Franklin is here to discuss her progress with her obesity treatment plan along with follow-up of her obesity related diagnoses. Felicia Franklin is on the Category 2 Plan and states she is following her eating plan approximately 30% of the time. Felicia Franklin states she is doing water exercises for 60-95 minutes 5-6 times per week.  Today's visit was #: 64 Starting weight: 198 lbs Starting date: 10/08/2020 Today's weight: 183 lbs Today's date: 11/17/2022 Total lbs lost to date: 15 Total lbs lost since last in-office visit: 4  Interim History: Felicia Franklin has struggled to stay on track with her eating plan. She notes increased hunger off her GLP-1 and this has made following her plan more difficult.   Subjective:   1. Prediabetes Felicia Franklin was changed from GLP-1 to metformin due to lack of insurance coverage.  She still notes significant polyphagia.  2. Emotional Eating Behavior Felicia Franklin is working on decreasing emotional eating behaviors.  She is doing well on Wellbutrin with no side effects noted.  Assessment/Plan:   1. Prediabetes Felicia Franklin agreed to increase metformin to 500 mg twice daily, and we will refill for 1 month.  - metFORMIN (GLUCOPHAGE) 500 MG tablet; Take 1 tablet (500 mg total) by mouth 2 (two) times daily with a meal.  Dispense: 60 tablet; Refill: 0  2. Emotional Eating Behavior Felicia Franklin will continue Wellbutrin SR, and we will refill for 1 month.  - buPROPion (WELLBUTRIN SR) 150 MG 12 hr tablet; Take 1 tablet (150 mg total) by mouth 2 (two) times daily.  Dispense: 60 tablet; Refill: 0  3. BMI 33.0-33.9,adult  4. Obesity, Beginning BMI 36.21 Felicia Franklin is currently in the action stage of change. As such, her goal is to continue with weight loss efforts. She has agreed to the Category 2 Plan.   Exercise goals: As is.  Behavioral modification strategies: increasing lean protein intake and no skipping meals.  Felicia Franklin has agreed to follow-up with our clinic in 3 to 4 weeks.  She was informed of the importance of frequent follow-up visits to maximize her success with intensive lifestyle modifications for her multiple health conditions.   Objective:   Blood pressure 119/74, pulse 68, temperature 98.2 F (36.8 C), height '5\' 2"'$  (1.575 m), weight 183 lb (83 kg), last menstrual period 10/03/1998, SpO2 95 %. Body mass index is 33.47 kg/m.  General: Cooperative, alert, well developed, in no acute distress. HEENT: Conjunctivae and lids unremarkable. Cardiovascular: Regular rhythm.  Lungs: Normal work of breathing. Neurologic: No focal deficits.   Lab Results  Component Value Date   CREATININE 0.94 04/18/2022   BUN 17 04/18/2022   NA 134 04/18/2022   K 4.7 04/18/2022   CL 99 04/18/2022   CO2 20 04/18/2022   Lab Results  Component Value Date   ALT 17 04/18/2022   AST 20 04/18/2022   ALKPHOS 109 04/18/2022   BILITOT 0.7 04/18/2022   Lab Results  Component Value Date   HGBA1C 5.2 04/18/2022   HGBA1C 5.5 07/12/2021   HGBA1C 5.6 10/08/2020   Lab Results  Component Value Date   INSULIN 10.5 04/18/2022   INSULIN 22.3 07/12/2021   INSULIN 23.3 10/08/2020   Lab Results  Component Value Date   TSH 2.190 04/18/2022   Lab Results  Component Value Date   CHOL 166 04/18/2022   HDL 61 04/18/2022   LDLCALC 88 04/18/2022   TRIG 93 04/18/2022   Lab Results  Component Value Date   VD25OH 66.9 04/18/2022  VD25OH 52.1 07/12/2021   VD25OH 66.0 10/08/2020   Lab Results  Component Value Date   WBC 5.9 04/18/2022   HGB 13.1 04/18/2022   HCT 39.5 04/18/2022   MCV 91 04/18/2022   PLT 255 04/18/2022   No results found for: "IRON", "TIBC", "FERRITIN"  Attestation Statements:   Reviewed by clinician on day of visit: allergies, medications, problem list, medical history, surgical history, family history, social history, and previous encounter notes.   I, Trixie Dredge, am acting as transcriptionist for Dennard Nip, MD.  I have reviewed the above  documentation for accuracy and completeness, and I agree with the above. -  Dennard Nip, MD

## 2022-12-06 ENCOUNTER — Encounter (HOSPITAL_BASED_OUTPATIENT_CLINIC_OR_DEPARTMENT_OTHER): Payer: Self-pay

## 2022-12-06 DIAGNOSIS — I10 Essential (primary) hypertension: Secondary | ICD-10-CM

## 2022-12-15 DIAGNOSIS — M25562 Pain in left knee: Secondary | ICD-10-CM | POA: Diagnosis not present

## 2022-12-21 ENCOUNTER — Ambulatory Visit (INDEPENDENT_AMBULATORY_CARE_PROVIDER_SITE_OTHER): Payer: Medicare PPO | Admitting: Family Medicine

## 2022-12-21 ENCOUNTER — Encounter (INDEPENDENT_AMBULATORY_CARE_PROVIDER_SITE_OTHER): Payer: Self-pay | Admitting: Family Medicine

## 2022-12-21 ENCOUNTER — Other Ambulatory Visit (INDEPENDENT_AMBULATORY_CARE_PROVIDER_SITE_OTHER): Payer: Self-pay | Admitting: Family Medicine

## 2022-12-21 VITALS — BP 117/71 | HR 51 | Temp 97.8°F | Ht 62.0 in | Wt 181.0 lb

## 2022-12-21 DIAGNOSIS — F3289 Other specified depressive episodes: Secondary | ICD-10-CM

## 2022-12-21 DIAGNOSIS — E669 Obesity, unspecified: Secondary | ICD-10-CM

## 2022-12-21 DIAGNOSIS — R7303 Prediabetes: Secondary | ICD-10-CM

## 2022-12-21 DIAGNOSIS — Z6833 Body mass index (BMI) 33.0-33.9, adult: Secondary | ICD-10-CM | POA: Diagnosis not present

## 2022-12-21 MED ORDER — TOPIRAMATE 50 MG PO TABS: 50.0000 mg | ORAL_TABLET | Freq: Every morning | ORAL | 0 refills | Status: DC

## 2022-12-21 MED ORDER — SPIRONOLACTONE 25 MG PO TABS: 25.0000 mg | ORAL_TABLET | Freq: Every morning | ORAL | 3 refills | Status: DC

## 2022-12-21 MED ORDER — BUPROPION HCL ER (SR) 150 MG PO TB12: 150.0000 mg | ORAL_TABLET | Freq: Two times a day (BID) | ORAL | 0 refills | Status: DC

## 2022-12-21 MED ORDER — METFORMIN HCL 500 MG PO TABS: 500.0000 mg | ORAL_TABLET | Freq: Two times a day (BID) | ORAL | 0 refills | Status: DC

## 2022-12-26 NOTE — Progress Notes (Unsigned)
Chief Complaint:   OBESITY Felicia Franklin is here to discuss her progress with her obesity treatment plan along with follow-up of her obesity related diagnoses. Felicia Franklin is on the Category 2 Plan and states she is following her eating plan approximately 20% of the time. Felicia Franklin states she is doing water exercise for 30-90 minutes 5-6 times per week.  Today's visit was #: 47 Starting weight: 198 lbs Starting date: 10/08/2020 Today's weight: 181 lbs Today's date: 12/21/2022 Total lbs lost to date: 17 Total lbs lost since last in-office visit: 2  Interim History: Felicia Franklin continues to work on her diet and weight loss, but hasn't been able to follow her plan closely. She has been doing some celebration eating for her birthday, but she has remained mindful of her food choices.   Subjective:   1. Prediabetes Felicia Franklin is on metformin and  she is working on decreasing simple carbohydrates.   2. Emotional Eating Behavior Felicia Franklin is on Wellbutrin to help decrease emotional eating behavior. She is still struggling and she is open to looking at other options.   Assessment/Plan:   1. Prediabetes Felicia Franklin will continue metformin, and we will refill for 1 month.   - metFORMIN (GLUCOPHAGE) 500 MG tablet; Take 1 tablet (500 mg total) by mouth 2 (two) times daily with a meal.  Dispense: 60 tablet; Refill: 0  2. Emotional Eating Behavior Felicia Franklin agreed to start Topamax 50 mg qhs with no refills. We will refill Wellbutrin SR for 1 month.    - buPROPion (WELLBUTRIN SR) 150 MG 12 hr tablet; Take 1 tablet (150 mg total) by mouth 2 (two) times daily.  Dispense: 60 tablet; Refill: 0 - topiramate (TOPAMAX) 50 MG tablet; Take 1 tablet (50 mg total) by mouth every morning.  Dispense: 30 tablet; Refill: 0  3. BMI 33.0-33.9,adult  4. Obesity, Beginning BMI 36.21 Felicia Franklin is currently in the action stage of change. As such, her goal is to continue with weight loss efforts. She has agreed to the Category 2 Plan.   Exercise goals:  As is.   Behavioral modification strategies: decreasing simple carbohydrates and travel eating strategies.  Felicia Franklin has agreed to follow-up with our clinic in 4 weeks. She was informed of the importance of frequent follow-up visits to maximize her success with intensive lifestyle modifications for her multiple health conditions.   Objective:   Blood pressure 117/71, pulse (!) 51, temperature 97.8 F (36.6 C), height 5\' 2"  (1.575 m), weight 181 lb (82.1 kg), last menstrual period 10/03/1998, SpO2 97 %. Body mass index is 33.11 kg/m.  Lab Results  Component Value Date   CREATININE 0.94 04/18/2022   BUN 17 04/18/2022   NA 134 04/18/2022   K 4.7 04/18/2022   CL 99 04/18/2022   CO2 20 04/18/2022   Lab Results  Component Value Date   ALT 17 04/18/2022   AST 20 04/18/2022   ALKPHOS 109 04/18/2022   BILITOT 0.7 04/18/2022   Lab Results  Component Value Date   HGBA1C 5.2 04/18/2022   HGBA1C 5.5 07/12/2021   HGBA1C 5.6 10/08/2020   Lab Results  Component Value Date   INSULIN 10.5 04/18/2022   INSULIN 22.3 07/12/2021   INSULIN 23.3 10/08/2020   Lab Results  Component Value Date   TSH 2.190 04/18/2022   Lab Results  Component Value Date   CHOL 166 04/18/2022   HDL 61 04/18/2022   LDLCALC 88 04/18/2022   TRIG 93 04/18/2022   Lab Results  Component Value Date  VD25OH 66.9 04/18/2022   VD25OH 52.1 07/12/2021   VD25OH 66.0 10/08/2020   Lab Results  Component Value Date   WBC 5.9 04/18/2022   HGB 13.1 04/18/2022   HCT 39.5 04/18/2022   MCV 91 04/18/2022   PLT 255 04/18/2022   No results found for: "IRON", "TIBC", "FERRITIN"  Attestation Statements:   Reviewed by clinician on day of visit: allergies, medications, problem list, medical history, surgical history, family history, social history, and previous encounter notes.   I, Trixie Dredge, am acting as transcriptionist for Dennard Nip, MD.  I have reviewed the above documentation for accuracy and  completeness, and I agree with the above. -  Dennard Nip, MD

## 2023-01-24 ENCOUNTER — Encounter (INDEPENDENT_AMBULATORY_CARE_PROVIDER_SITE_OTHER): Payer: Self-pay | Admitting: Family Medicine

## 2023-01-24 ENCOUNTER — Other Ambulatory Visit (INDEPENDENT_AMBULATORY_CARE_PROVIDER_SITE_OTHER): Payer: Self-pay | Admitting: Family Medicine

## 2023-01-24 ENCOUNTER — Ambulatory Visit (INDEPENDENT_AMBULATORY_CARE_PROVIDER_SITE_OTHER): Payer: Medicare PPO | Admitting: Family Medicine

## 2023-01-24 VITALS — BP 118/70 | HR 57 | Temp 98.1°F | Ht 62.0 in | Wt 187.0 lb

## 2023-01-24 DIAGNOSIS — R7303 Prediabetes: Secondary | ICD-10-CM

## 2023-01-24 DIAGNOSIS — Z6834 Body mass index (BMI) 34.0-34.9, adult: Secondary | ICD-10-CM

## 2023-01-24 DIAGNOSIS — E669 Obesity, unspecified: Secondary | ICD-10-CM | POA: Diagnosis not present

## 2023-01-24 DIAGNOSIS — F3289 Other specified depressive episodes: Secondary | ICD-10-CM

## 2023-01-24 MED ORDER — TOPIRAMATE 50 MG PO TABS
50.0000 mg | ORAL_TABLET | Freq: Every day | ORAL | 0 refills | Status: DC
Start: 2023-01-24 — End: 2023-02-22

## 2023-01-24 MED ORDER — METFORMIN HCL 500 MG PO TABS
500.0000 mg | ORAL_TABLET | Freq: Two times a day (BID) | ORAL | 0 refills | Status: DC
Start: 2023-01-24 — End: 2023-05-23

## 2023-01-25 NOTE — Progress Notes (Signed)
Chief Complaint:   OBESITY Felicia Franklin is here to discuss her progress with her obesity treatment plan along with follow-up of her obesity related diagnoses. Felicia Franklin is on the Category 2 Plan and states she is following her eating plan approximately 0% of the time. Felicia Franklin states she is doing water exercise for 105 minutes 5 times per week.  Today's visit was #: 34 Starting weight: 198 lbs Starting date: 10/08/2020 Today's weight: 187 lbs Today's date: 01/24/2023 Total lbs lost to date: 11 Total lbs lost since last in-office visit: 0  Interim History: Felicia Franklin was traveling and she did some celebration eating. She is working on getting back on track and would like to restart Category 2 and vegetarian.  Subjective:   1. Prediabetes Felicia Franklin stopped metformin as it was causing some increased GI upset, and she didn't want to deal with this while traveling.   2. Emotional Eating Behavior Felicia Franklin has not started topiramate yet, but she is ready to start now that her traveling is due for now.   Assessment/Plan:   1. Prediabetes We will refill metformin for 1 month. Felicia Franklin is ok to restart at 1 tablet PO daily for now.   - metFORMIN (GLUCOPHAGE) 500 MG tablet; Take 1 tablet (500 mg total) by mouth 2 (two) times daily with a meal.  Dispense: 60 tablet; Refill: 0  2. Emotional Eating Behavior Felicia Franklin will continue topiramate 50 mg once daily, and we will refill for 1 month.   - topiramate (TOPAMAX) 50 MG tablet; Take 1 tablet (50 mg total) by mouth at bedtime.  Dispense: 30 tablet; Refill: 0  3. BMI 34.0-34.9,adult  4. Obesity, Beginning BMI 36.21 Felicia Franklin is currently in the action stage of change. As such, her goal is to continue with weight loss efforts. She has agreed to the Category 2 Plan or the Vegetarian Plan.   Exercise goals: As is.   Behavioral modification strategies: increasing lean protein intake and increasing vegetables.  Felicia Franklin has agreed to follow-up with our clinic in 4 weeks. She  was informed of the importance of frequent follow-up visits to maximize her success with intensive lifestyle modifications for her multiple health conditions.   Objective:   Blood pressure 118/70, pulse (!) 57, temperature 98.1 F (36.7 C), height  (1.575 m), weight 187 lb (84.8 kg), last menstrual period 10/03/1998, SpO2 97 %. Body mass index is 34.2 kg/m.  Lab Results  Component Value Date   CREATININE 0.94 04/18/2022   BUN 17 04/18/2022   NA 134 04/18/2022   K 4.7 04/18/2022   CL 99 04/18/2022   CO2 20 04/18/2022   Lab Results  Component Value Date   ALT 17 04/18/2022   AST 20 04/18/2022   ALKPHOS 109 04/18/2022   BILITOT 0.7 04/18/2022   Lab Results  Component Value Date   HGBA1C 5.2 04/18/2022   HGBA1C 5.5 07/12/2021   HGBA1C 5.6 10/08/2020   Lab Results  Component Value Date   INSULIN 10.5 04/18/2022   INSULIN 22.3 07/12/2021   INSULIN 23.3 10/08/2020   Lab Results  Component Value Date   TSH 2.190 04/18/2022   Lab Results  Component Value Date   CHOL 166 04/18/2022   HDL 61 04/18/2022   LDLCALC 88 04/18/2022   TRIG 93 04/18/2022   Lab Results  Component Value Date   VD25OH 66.9 04/18/2022   VD25OH 52.1 07/12/2021   VD25OH 66.0 10/08/2020   Lab Results  Component Value Date   WBC 5.9 04/18/2022  HGB 13.1 04/18/2022   HCT 39.5 04/18/2022   MCV 91 04/18/2022   PLT 255 04/18/2022   No results found for: "IRON", "TIBC", "FERRITIN"  Attestation Statements:   Reviewed by clinician on day of visit: allergies, medications, problem list, medical history, surgical history, family history, social history, and previous encounter notes.   I, Burt Knack, am acting as transcriptionist for Quillian Quince, MD.  I have reviewed the above documentation for accuracy and completeness, and I agree with the above. -  Quillian Quince, MD

## 2023-02-02 DIAGNOSIS — M545 Low back pain, unspecified: Secondary | ICD-10-CM | POA: Diagnosis not present

## 2023-02-02 DIAGNOSIS — M25552 Pain in left hip: Secondary | ICD-10-CM | POA: Diagnosis not present

## 2023-02-13 DIAGNOSIS — M25552 Pain in left hip: Secondary | ICD-10-CM | POA: Diagnosis not present

## 2023-02-13 DIAGNOSIS — M545 Low back pain, unspecified: Secondary | ICD-10-CM | POA: Diagnosis not present

## 2023-02-22 ENCOUNTER — Encounter (INDEPENDENT_AMBULATORY_CARE_PROVIDER_SITE_OTHER): Payer: Self-pay | Admitting: Family Medicine

## 2023-02-22 ENCOUNTER — Ambulatory Visit (INDEPENDENT_AMBULATORY_CARE_PROVIDER_SITE_OTHER): Payer: Medicare PPO | Admitting: Family Medicine

## 2023-02-22 ENCOUNTER — Other Ambulatory Visit (INDEPENDENT_AMBULATORY_CARE_PROVIDER_SITE_OTHER): Payer: Self-pay | Admitting: Family Medicine

## 2023-02-22 VITALS — BP 119/70 | HR 65 | Temp 98.0°F | Ht 62.0 in | Wt 184.0 lb

## 2023-02-22 DIAGNOSIS — E669 Obesity, unspecified: Secondary | ICD-10-CM

## 2023-02-22 DIAGNOSIS — Z6833 Body mass index (BMI) 33.0-33.9, adult: Secondary | ICD-10-CM | POA: Diagnosis not present

## 2023-02-22 DIAGNOSIS — F3289 Other specified depressive episodes: Secondary | ICD-10-CM

## 2023-02-22 DIAGNOSIS — R7303 Prediabetes: Secondary | ICD-10-CM

## 2023-02-22 MED ORDER — BUPROPION HCL ER (SR) 150 MG PO TB12
150.0000 mg | ORAL_TABLET | Freq: Two times a day (BID) | ORAL | 0 refills | Status: DC
Start: 2023-02-22 — End: 2023-03-21

## 2023-02-22 MED ORDER — TOPIRAMATE 50 MG PO TABS: 50.0000 mg | ORAL_TABLET | Freq: Every day | ORAL | 0 refills | Status: DC

## 2023-02-28 NOTE — Progress Notes (Signed)
Chief Complaint:   OBESITY Felicia Franklin is here to discuss her progress with her obesity treatment plan along with follow-up of her obesity related diagnoses. Felicia Franklin is on the Category 2 Plan and the Vegetarian Plan and states she is following her eating plan approximately 30% of the time. Felicia Franklin states she is doing water exercise for 60 minutes 5 times per week.  Today's visit was #: 35 Starting weight: 198 lbs Starting date: 10/08/2022 Today's weight: 184 lbs Today's date: 02/22/2023 Total lbs lost to date: 14 Total lbs lost since last in-office visit: 3  Interim History: Patient has been working on getting back on track with her weight loss efforts. She is working on Field seismologist exercises, but she has to limit how much she can exercise.   Subjective:   1. Prediabetes Patient has done better with her diet and weight loss.  She has no problems with metformin.  2. Emotional Eating Behavior Patient continues to be mindful of emotional eating behaviors and triggers, which increases cravings.  She is doing well overall on her medications.  Assessment/Plan:   1. Prediabetes Patient will continue metformin, and we will keep working on decreasing simple carbohydrates.  2. Emotional Eating Behavior Patient will continue her medications, and we will refill Wellbutrin SR and Topamax for 1 month.  - buPROPion (WELLBUTRIN SR) 150 MG 12 hr tablet; Take 1 tablet (150 mg total) by mouth 2 (two) times daily.  Dispense: 60 tablet; Refill: 0 - topiramate (TOPAMAX) 50 MG tablet; Take 1 tablet (50 mg total) by mouth at bedtime.  Dispense: 30 tablet; Refill: 0  3. BMI 33.0-33.9,adult  4. Obesity, Beginning BMI 36.21 Felicia Franklin is currently in the action stage of change. As such, her goal is to continue with weight loss efforts. She has agreed to the Category 2 Plan or the Vegetarian Plan.   Exercise goals: As tolerated.   Behavioral modification strategies: increasing lean protein intake and  emotional eating strategies.  Felicia Franklin has agreed to follow-up with our clinic in 4 weeks. She was informed of the importance of frequent follow-up visits to maximize her success with intensive lifestyle modifications for her multiple health conditions.   Objective:   Blood pressure 119/70, pulse 65, temperature 98 F (36.7 C), height 5\' 2"  (1.575 m), weight 184 lb (83.5 kg), last menstrual period 10/03/1998, SpO2 96 %. Body mass index is 33.65 kg/m.  Lab Results  Component Value Date   CREATININE 0.94 04/18/2022   BUN 17 04/18/2022   NA 134 04/18/2022   K 4.7 04/18/2022   CL 99 04/18/2022   CO2 20 04/18/2022   Lab Results  Component Value Date   ALT 17 04/18/2022   AST 20 04/18/2022   ALKPHOS 109 04/18/2022   BILITOT 0.7 04/18/2022   Lab Results  Component Value Date   HGBA1C 5.2 04/18/2022   HGBA1C 5.5 07/12/2021   HGBA1C 5.6 10/08/2020   Lab Results  Component Value Date   INSULIN 10.5 04/18/2022   INSULIN 22.3 07/12/2021   INSULIN 23.3 10/08/2020   Lab Results  Component Value Date   TSH 2.190 04/18/2022   Lab Results  Component Value Date   CHOL 166 04/18/2022   HDL 61 04/18/2022   LDLCALC 88 04/18/2022   TRIG 93 04/18/2022   Lab Results  Component Value Date   VD25OH 66.9 04/18/2022   VD25OH 52.1 07/12/2021   VD25OH 66.0 10/08/2020   Lab Results  Component Value Date   WBC 5.9 04/18/2022  HGB 13.1 04/18/2022   HCT 39.5 04/18/2022   MCV 91 04/18/2022   PLT 255 04/18/2022   No results found for: "IRON", "TIBC", "FERRITIN"  Attestation Statements:   Reviewed by clinician on day of visit: allergies, medications, problem list, medical history, surgical history, family history, social history, and previous encounter notes.   I, Burt Knack, am acting as transcriptionist for Quillian Quince, MD.  I have reviewed the above documentation for accuracy and completeness, and I agree with the above. -  Quillian Quince, MD

## 2023-03-05 DIAGNOSIS — M79671 Pain in right foot: Secondary | ICD-10-CM | POA: Diagnosis not present

## 2023-03-07 DIAGNOSIS — M13871 Other specified arthritis, right ankle and foot: Secondary | ICD-10-CM | POA: Diagnosis not present

## 2023-03-07 DIAGNOSIS — M79671 Pain in right foot: Secondary | ICD-10-CM | POA: Diagnosis not present

## 2023-03-08 DIAGNOSIS — M79671 Pain in right foot: Secondary | ICD-10-CM | POA: Diagnosis not present

## 2023-03-14 DIAGNOSIS — M25571 Pain in right ankle and joints of right foot: Secondary | ICD-10-CM | POA: Diagnosis not present

## 2023-03-20 ENCOUNTER — Other Ambulatory Visit (INDEPENDENT_AMBULATORY_CARE_PROVIDER_SITE_OTHER): Payer: Self-pay | Admitting: Family Medicine

## 2023-03-20 DIAGNOSIS — R7303 Prediabetes: Secondary | ICD-10-CM

## 2023-03-20 DIAGNOSIS — F3289 Other specified depressive episodes: Secondary | ICD-10-CM

## 2023-03-21 ENCOUNTER — Encounter (INDEPENDENT_AMBULATORY_CARE_PROVIDER_SITE_OTHER): Payer: Self-pay | Admitting: Family Medicine

## 2023-03-21 ENCOUNTER — Ambulatory Visit (INDEPENDENT_AMBULATORY_CARE_PROVIDER_SITE_OTHER): Payer: Medicare PPO | Admitting: Family Medicine

## 2023-03-21 VITALS — BP 108/63 | HR 63 | Temp 98.2°F | Ht 62.0 in | Wt 186.0 lb

## 2023-03-21 DIAGNOSIS — R7303 Prediabetes: Secondary | ICD-10-CM | POA: Diagnosis not present

## 2023-03-21 DIAGNOSIS — F3289 Other specified depressive episodes: Secondary | ICD-10-CM | POA: Diagnosis not present

## 2023-03-21 DIAGNOSIS — Z6834 Body mass index (BMI) 34.0-34.9, adult: Secondary | ICD-10-CM | POA: Diagnosis not present

## 2023-03-21 DIAGNOSIS — E669 Obesity, unspecified: Secondary | ICD-10-CM | POA: Diagnosis not present

## 2023-03-21 MED ORDER — TOPIRAMATE 50 MG PO TABS: 50.0000 mg | ORAL_TABLET | Freq: Every day | ORAL | 0 refills | Status: DC

## 2023-03-21 MED ORDER — BUPROPION HCL ER (SR) 150 MG PO TB12: 150.0000 mg | ORAL_TABLET | Freq: Two times a day (BID) | ORAL | 0 refills | Status: DC

## 2023-03-21 NOTE — Progress Notes (Unsigned)
Chief Complaint:   OBESITY Felicia Franklin is here to discuss her progress with her obesity treatment plan along with follow-up of her obesity related diagnoses. Felicia Franklin is on the Category 2 Plan or the Vegetarian Plan and states she is following her eating plan approximately 10% of the time. Felicia Franklin states she is doing 0 minutes 0 times per week.  Today's visit was #: 36 Starting weight: 198 lbs Starting date: 10/08/2020 Today's weight: 186 lbs Today's date: 03/21/2023 Total lbs lost to date: 12 Total lbs lost since last in-office visit: 0  Interim History: Patient has had limited activity due to increased joint pain.  She was informed that she is at risk of rheumatoid arthritis.  She is retaining a small amount of water weight.  Subjective:   1. Prediabetes Patient notes nausea on metformin, and she asks if she needs to continue to take this  2. Emotional Eating Behavior Patient is struggling with some increased sweet cravings recently.  No side effects of her medications were noted.  Assessment/Plan:   1. Prediabetes Patient agreed to decrease metformin to 1/2 tablet, and she will continue with her diet. We will follow-up in 1 month. She is ok to stop if nausea continues even on a lower dose.   2. Emotional Eating Behavior We will refill Wellbutrin SR and Topamax for 1 month. Patient will continue to work on decreasing emotional eating behavior, and strategies were discussed.   - buPROPion (WELLBUTRIN SR) 150 MG 12 hr tablet; Take 1 tablet (150 mg total) by mouth 2 (two) times daily.  Dispense: 60 tablet; Refill: 0 - topiramate (TOPAMAX) 50 MG tablet; Take 1 tablet (50 mg total) by mouth at bedtime.  Dispense: 30 tablet; Refill: 0  3. BMI 34.0-34.9,adult  4. Obesity, Beginning BMI 36.21 Felicia Franklin is currently in the action stage of change. As such, her goal is to continue with weight loss efforts. She has agreed to the Category 2 Plan or the Vegetarian Plan.   Exercise goals: All adults  should avoid inactivity. Some physical activity is better than none, and adults who participate in any amount of physical activity gain some health benefits. As tolerated.  Behavioral modification strategies: increasing lean protein intake and travel eating strategies.  Felicia Franklin has agreed to follow-up with our clinic in 4 weeks. She was informed of the importance of frequent follow-up visits to maximize her success with intensive lifestyle modifications for her multiple health conditions.   Objective:   Blood pressure 108/63, pulse 63, temperature 98.2 F (36.8 C), height 5\' 2"  (1.575 m), weight 186 lb (84.4 kg), last menstrual period 10/03/1998, SpO2 96 %. Body mass index is 34.02 kg/m.  Lab Results  Component Value Date   CREATININE 0.94 04/18/2022   BUN 17 04/18/2022   NA 134 04/18/2022   K 4.7 04/18/2022   CL 99 04/18/2022   CO2 20 04/18/2022   Lab Results  Component Value Date   ALT 17 04/18/2022   AST 20 04/18/2022   ALKPHOS 109 04/18/2022   BILITOT 0.7 04/18/2022   Lab Results  Component Value Date   HGBA1C 5.2 04/18/2022   HGBA1C 5.5 07/12/2021   HGBA1C 5.6 10/08/2020   Lab Results  Component Value Date   INSULIN 10.5 04/18/2022   INSULIN 22.3 07/12/2021   INSULIN 23.3 10/08/2020   Lab Results  Component Value Date   TSH 2.190 04/18/2022   Lab Results  Component Value Date   CHOL 166 04/18/2022   HDL 61 04/18/2022  LDLCALC 88 04/18/2022   TRIG 93 04/18/2022   Lab Results  Component Value Date   VD25OH 66.9 04/18/2022   VD25OH 52.1 07/12/2021   VD25OH 66.0 10/08/2020   Lab Results  Component Value Date   WBC 5.9 04/18/2022   HGB 13.1 04/18/2022   HCT 39.5 04/18/2022   MCV 91 04/18/2022   PLT 255 04/18/2022   No results found for: "IRON", "TIBC", "FERRITIN"  Attestation Statements:   Reviewed by clinician on day of visit: allergies, medications, problem list, medical history, surgical history, family history, social history, and previous  encounter notes.   I, Burt Knack, am acting as transcriptionist for Quillian Quince, MD.  I have reviewed the above documentation for accuracy and completeness, and I agree with the above. -  Quillian Quince, MD

## 2023-03-27 ENCOUNTER — Ambulatory Visit (HOSPITAL_BASED_OUTPATIENT_CLINIC_OR_DEPARTMENT_OTHER): Payer: Medicare PPO | Admitting: Family

## 2023-03-27 ENCOUNTER — Encounter (HOSPITAL_BASED_OUTPATIENT_CLINIC_OR_DEPARTMENT_OTHER): Payer: Self-pay | Admitting: Family

## 2023-03-27 VITALS — BP 122/68 | HR 58 | Ht 62.0 in | Wt 187.0 lb

## 2023-03-27 DIAGNOSIS — I480 Paroxysmal atrial fibrillation: Secondary | ICD-10-CM | POA: Diagnosis not present

## 2023-03-27 DIAGNOSIS — I10 Essential (primary) hypertension: Secondary | ICD-10-CM

## 2023-03-27 DIAGNOSIS — R0600 Dyspnea, unspecified: Secondary | ICD-10-CM | POA: Diagnosis not present

## 2023-03-27 DIAGNOSIS — E782 Mixed hyperlipidemia: Secondary | ICD-10-CM | POA: Diagnosis not present

## 2023-03-27 DIAGNOSIS — D6859 Other primary thrombophilia: Secondary | ICD-10-CM | POA: Diagnosis not present

## 2023-03-27 NOTE — Progress Notes (Signed)
Cardiology Office Note:  .   Date:  03/27/2023  ID:  Felicia Franklin, DOB December 12, 1945, MRN [id number] PCP: Geoffry Paradise, MD  [facility] Providers Cardiologist:  Chilton Si, MD    History of Present Illness: .   Felicia Franklin is a 77 y.o. female hx of hypertension, hyperlipidemia, paroxysmal atrial fibrillation, arthritis, sleep apnea, anxiety last seen 11/22/2022   Diagnosed with atrial fibrillation in 2014.  Initially evaluated 07/2021 with dyspnea with echocardiogram revealing LVEF 63%.  Nuclear stress test 09/2021 LVEF 67%, no ischemia.   At visit 03/2022 noted occasional episode of lightheadedness recommended for ambulatory monitor if increased symptoms. Seen 10/21/22 with BP mildly elevated recommended for home monitoring. She had no recurrent lightheadedness though a week later noted recurrent episodes and ambulatory monitor ordered. Monitor with 21% atrial fibrillation burden which was rate controlled.  At follow-up 11/22/2022 for optimal rate control and improvement in blood pressure Bystolic was increased to 20 mg daily.  Presents today for follow up. Orthopedics sent her for blood work and is awaiting possible referral to rheumatology as CRP, sed rate, rheumatoid factor were elevated. She has had multiple orthopedic issues over the last 6 months including neck, hip, ankle. Uric acid and ANA were normal. Reports no shortness of breath at rest though does note dyspnea on exertion. Recently required wheelchair assistance through airport while visiting family in [place], [place]. Reports no chest pain, pressure, or tightness. No edema, orthopnea, PND. Reports no palpitations.  Exercising a couple days per week with water aerobics at [facility].  03/08/23 Uric acid 5.7 WNL Rheumatoid factor 18 (normal <14) CRP 168 (normal <8) Sed rerate 39 (normal <30) Wbc 11.1 Hb 11.4   Labs 10/04/22 via [facility] Total cholesterol 165, triglycerides 57, HDL 63, LDL 91 Creatitine 0.9, GFR 82 Hb  12.7, Plt 245  ROS: Please see the history of present illness.    All other systems reviewed and are negative.   Studies Reviewed: .        Cardiac Studies & Procedures       ECHOCARDIOGRAM  PCV ECHOCARDIOGRAM COMPLETE 08/03/2021  Narrative Echocardiogram 08/02/2021: Left ventricle cavity is normal in size and wall thickness. Normal global wall motion. Normal LV systolic function with EF 63%. Normal diastolic filling pattern. Left atrial cavity is mildly dilated. Mild to moderate mitral regurgitation. Mild tricuspid regurgitation. Estimated pulmonary artery systolic pressure 24 mmHg. Mild pulmonic regurgitation. Previous study in 2014 reported grade II diastolic dysfunction, trace MR, no TR/PI.    MONITORS  CARDIAC EVENT MONITOR 12/16/2022  Narrative 14 Day Event Monitor  Quality: Fair.  Baseline artifact. Predominant rhythm: Sinus rhythm.  21% atrial fibrillation. Average heart rate: 65 bpm Max heart rate: 111 bpm Min heart rate: 46 bpm Pauses >2.5 seconds: none  Rare (<1%) PACs and PVCs  Tiffany C. Duke Salvia, MD, [facility] 12/16/2022 10:52 PM   CT SCANS  CT CARDIAC SCORING (SELF PAY ONLY) 04/03/2022  Addendum 04/03/2022  4:50 PM ADDENDUM REPORT: 04/03/2022 16:48  CLINICAL DATA:  Cardiovascular Disease Risk stratification  EXAM: Coronary Calcium Score  TECHNIQUE: A gated, non-contrast computed tomography scan of the heart was performed using 3mm slice thickness. Axial images were analyzed on a dedicated workstation. Calcium scoring of the coronary arteries was performed using the Agatston method.  FINDINGS: Coronary arteries: Normal origins.  Coronary Calcium Score:  Left main:  Left anterior descending artery:  Left circumflex artery:  Right coronary artery:  Total: 0  Percentile:  Pericardium: Normal.  Ascending Aorta:  Normal caliber.  Non-cardiac: See separate report from [facility].  IMPRESSION: Coronary calcium score of  0.  RECOMMENDATIONS: Coronary artery calcium (CAC) score is a strong predictor of incident coronary heart disease (CHD) and provides predictive information beyond traditional risk factors. CAC scoring is reasonable to use in the decision to withhold, postpone, or initiate statin therapy in intermediate-risk or selected borderline-risk asymptomatic adults (age 60-75 years and LDL-C >=70 to <190 mg/dL) who do not have diabetes or established atherosclerotic cardiovascular disease (ASCVD).* In intermediate-risk (10-year ASCVD risk >=7.5% to <20%) adults or selected borderline-risk (10-year ASCVD risk >=5% to <7.5%) adults in whom a CAC score is measured for the purpose of making a treatment decision the following recommendations have been made:  If CAC=0, it is reasonable to withhold statin therapy and reassess in 5 to 10 years, as long as higher risk conditions are absent (diabetes mellitus, family history of premature CHD in first degree relatives (males <55 years; females <65 years), cigarette smoking, or LDL >=190 mg/dL).  If CAC is 1 to 99, it is reasonable to initiate statin therapy for patients >=103 years of age.  If CAC is >=100 or >=75th percentile, it is reasonable to initiate statin therapy at any age.  Cardiology referral should be considered for patients with CAC scores >=400 or >=75th percentile.  *2018 AHA/ACC/AACVPR/AAPA/ABC/ACPM/ADA/AGS/APhA/ASPC/NLA/PCNA Guideline on the Management of Blood Cholesterol: A Report of the American College of Cardiology/American Heart Association Task Force on Clinical Practice Guidelines. J Am Coll Cardiol. 2019;73(24):3168-3209.  Epifanio Lesches, MD   Electronically Signed By: Epifanio Lesches M.D. On: 04/03/2022 16:48  Narrative EXAM: OVER-READ INTERPRETATION  CT CHEST  The following report is a limited chest CT over-read performed by radiologist Dr. Trudie Reed of [facility] on 04/01/2022.  The coronary calcium score interpretation by the cardiologist is attached.  COMPARISON:  None Available.  FINDINGS: Atherosclerotic calcifications in the thoracic aorta. Small hiatal hernia. Calcifications calcified granulomas throughout the spleen and liver. Calcified granulomas in the right lower lobe with extensive calcified right hilar and subcarinal lymph nodes. Within the visualized portions of the thorax there are no suspicious appearing pulmonary nodules or masses, there is no acute consolidative airspace disease, no pleural effusions, no pneumothorax and no lymphadenopathy. Visualized portions of the upper abdomen are unremarkable. There are no aggressive appearing lytic or blastic lesions noted in the visualized portions of the skeleton.  IMPRESSION: 1. Old granulomatous disease, as above. 2.  Aortic Atherosclerosis (ICD10-I70.0).  Electronically Signed: By: Trudie Reed M.D. On: 04/02/2022 07:10          Risk Assessment/Calculations:    CHA2DS2-VASc Score = 4   This indicates a 4.8% annual risk of stroke. The patient's score is based upon: CHF History: 0 HTN History: 1 Diabetes History: 0 Stroke History: 0 Vascular Disease History: 0 Age Score: 2 Gender Score: 1            Physical Exam:   VS:  BP 122/68   Pulse (!) 58   Ht 5\' 2"  (1.575 m)   Wt 187 lb (84.8 kg)   LMP 10/03/1998   BMI 34.20 kg/m    Wt Readings from Last 3 Encounters:  03/27/23 187 lb (84.8 kg)  03/21/23 186 lb (84.4 kg)  02/22/23 184 lb (83.5 kg)    GEN: Well nourished, overweight, well developed in no acute distress NECK: No JVD; No carotid bruits CARDIAC: RRR, no murmurs, rubs, gallops RESPIRATORY:  Clear to auscultation without rales, wheezing or rhonchi  ABDOMEN: Soft, non-tender, non-distended EXTREMITIES:  No edema; No deformity   ASSESSMENT AND PLAN: .    Exertional dyspnea - Noted significant exertional dyspnea while walking through airport recently. No edema,  orthopnea, PND, chest pain. 03/2022 coronary calcium score of 0. Update echo to rule out heart failure. Consider etiology deconditioning, encouraged to continue to increase physical activity. If echo unremarkable and persistent dyspnea consider referral to pulmonary for PFTs.   PAF /hypercoagulable state - Monitor 11/2022 with 21% burden which is overall rate controlled.  Continue Eliquis 5 mg twice daily,  Bystolic 20mg  every day. She cannot necessarily distinguish when she is atrial fib vs NSR.   Does not meet dose reduction criteria for Eliquis.  Denies bleeding complications. CHA2DS2-VASc Score = 4 [CHF History: 0, HTN History: 1, Diabetes History: 0, Stroke History: 0, Vascular Disease History: 0, Age Score: 2, Gender Score: 1].  Therefore, the patient's annual risk of stroke is 4.8 %.       HTN - BP well controlled. Continue current antihypertensive regimen.   Discussed to monitor BP at home at least 2 hours after medications and sitting for 5-10 minutes.    OSA - Has not tolerated CPAP previously.   HLD -continue simvastatin 40 mg daily.  10/2022 LDL at goal of less than 100.       Dispo: follow up in 4-6 months  Signed, Alver Sorrow, NP

## 2023-03-27 NOTE — Patient Instructions (Signed)
Medication Instructions:  Your physician recommends that you continue on your current medications as directed. Please refer to the Current Medication list given to you today.  *If you need a refill on your cardiac medications before your next appointment, please call your pharmacy*   Testing/Procedures: Your physician has requested that you have an echocardiogram. Echocardiography is a painless test that uses sound waves to create images of your heart. It provides your doctor with information about the size and shape of your heart and how well your heart's chambers and valves are working. This procedure takes approximately one hour. There are no restrictions for this procedure. Please do NOT wear cologne, perfume, aftershave, or lotions (deodorant is allowed). Please arrive 15 minutes prior to your appointment time.  Follow-Up: At [facility], you and your health needs are our priority.  As part of our continuing mission to provide you with exceptional heart care, we have created designated Provider Care Teams.  These Care Teams include your primary Cardiologist (physician) and Advanced Practice Providers (APPs -  Physician Assistants and Nurse Practitioners) who all work together to provide you with the care you need, when you need it.  We recommend signing up for the patient portal called "MyChart".  Sign up information is provided on this After Visit Summary.  MyChart is used to connect with patients for Virtual Visits (Telemedicine).  Patients are able to view lab/test results, encounter notes, upcoming appointments, etc.  Non-urgent messages can be sent to your provider as well.   To learn more about what you can do with MyChart, go to [email or web address].    Your next appointment:   4-6 months with Dr. Duke Salvia or Gillian Shields, NP

## 2023-04-03 ENCOUNTER — Telehealth (HOSPITAL_BASED_OUTPATIENT_CLINIC_OR_DEPARTMENT_OTHER): Payer: Self-pay | Admitting: *Deleted

## 2023-04-03 NOTE — Telephone Encounter (Signed)
Left message regarding time change of the echocardiogram appointment on Friday 04/28/23---time changed from 3:00pm to 12:00pm---requested return confirmation call from patient

## 2023-04-04 ENCOUNTER — Encounter (HOSPITAL_BASED_OUTPATIENT_CLINIC_OR_DEPARTMENT_OTHER): Payer: Self-pay | Admitting: *Deleted

## 2023-04-04 ENCOUNTER — Encounter (HOSPITAL_BASED_OUTPATIENT_CLINIC_OR_DEPARTMENT_OTHER): Payer: Self-pay

## 2023-04-04 NOTE — Telephone Encounter (Signed)
Left message regarding Echocardiogram appointment time change on 04/28/23---new time is 12:00 pm.  Will mail letter to patient as well and send My Chart message

## 2023-04-17 DIAGNOSIS — M25571 Pain in right ankle and joints of right foot: Secondary | ICD-10-CM | POA: Diagnosis not present

## 2023-04-17 DIAGNOSIS — M1991 Primary osteoarthritis, unspecified site: Secondary | ICD-10-CM | POA: Diagnosis not present

## 2023-04-17 DIAGNOSIS — Z6835 Body mass index (BMI) 35.0-35.9, adult: Secondary | ICD-10-CM | POA: Diagnosis not present

## 2023-04-17 DIAGNOSIS — R7982 Elevated C-reactive protein (CRP): Secondary | ICD-10-CM | POA: Diagnosis not present

## 2023-04-17 DIAGNOSIS — E669 Obesity, unspecified: Secondary | ICD-10-CM | POA: Diagnosis not present

## 2023-04-17 DIAGNOSIS — R5383 Other fatigue: Secondary | ICD-10-CM | POA: Diagnosis not present

## 2023-04-17 DIAGNOSIS — M25562 Pain in left knee: Secondary | ICD-10-CM | POA: Diagnosis not present

## 2023-04-17 DIAGNOSIS — R768 Other specified abnormal immunological findings in serum: Secondary | ICD-10-CM | POA: Diagnosis not present

## 2023-04-17 DIAGNOSIS — M064 Inflammatory polyarthropathy: Secondary | ICD-10-CM | POA: Diagnosis not present

## 2023-04-19 ENCOUNTER — Other Ambulatory Visit (INDEPENDENT_AMBULATORY_CARE_PROVIDER_SITE_OTHER): Payer: Self-pay | Admitting: Family Medicine

## 2023-04-19 ENCOUNTER — Other Ambulatory Visit: Payer: Self-pay | Admitting: Cardiovascular Disease

## 2023-04-19 DIAGNOSIS — F3289 Other specified depressive episodes: Secondary | ICD-10-CM

## 2023-04-19 DIAGNOSIS — I480 Paroxysmal atrial fibrillation: Secondary | ICD-10-CM

## 2023-04-19 NOTE — Telephone Encounter (Signed)
Prescription refill request for Eliquis received. Indication: PAF Last office visit: 03/27/23  Brunetta Genera NP Scr: 0.94 on 04/18/22 Age: 77 Weight: 84.8kg  Based on above findings Eliquis 5mg  twice daily is the appropriate dose.  Refill approved.  Requested repeat labs at next OV.

## 2023-04-25 ENCOUNTER — Ambulatory Visit (INDEPENDENT_AMBULATORY_CARE_PROVIDER_SITE_OTHER): Payer: Medicare PPO | Admitting: Family Medicine

## 2023-04-25 ENCOUNTER — Encounter (INDEPENDENT_AMBULATORY_CARE_PROVIDER_SITE_OTHER): Payer: Self-pay | Admitting: Family Medicine

## 2023-04-25 VITALS — BP 115/68 | HR 61 | Temp 98.2°F | Ht 62.0 in | Wt 185.0 lb

## 2023-04-25 DIAGNOSIS — F3289 Other specified depressive episodes: Secondary | ICD-10-CM | POA: Diagnosis not present

## 2023-04-25 DIAGNOSIS — E669 Obesity, unspecified: Secondary | ICD-10-CM

## 2023-04-25 DIAGNOSIS — R7303 Prediabetes: Secondary | ICD-10-CM

## 2023-04-25 DIAGNOSIS — Z6833 Body mass index (BMI) 33.0-33.9, adult: Secondary | ICD-10-CM

## 2023-04-25 MED ORDER — BUPROPION HCL ER (SR) 150 MG PO TB12: 150.0000 mg | ORAL_TABLET | Freq: Two times a day (BID) | ORAL | 0 refills | Status: DC

## 2023-04-25 MED ORDER — TOPIRAMATE 50 MG PO TABS: 50.0000 mg | ORAL_TABLET | Freq: Every day | ORAL | 0 refills | Status: DC

## 2023-04-25 NOTE — Progress Notes (Unsigned)
Chief Complaint:   OBESITY Felicia Franklin is here to discuss her progress with her obesity treatment plan along with follow-up of her obesity related diagnoses. Floyce is on the Category 2 Plan or the Vegetarian Plan and states she is following her eating plan approximately 30% of the time. [name] states she is doing water exercise for 90 minutes 5 times per week.  Today's visit was #: 37 Starting weight: 198 lbs Starting date: 10/08/2020 Today's weight: 185 lbs Today's date: 04/25/2023 Total lbs lost to date: 13 Total lbs lost since last in-office visit: 1  Interim History: Patient has done well with her exercise especially these last few weeks.  She would like to look at other eating plans that are especially good for cardiac health.  Subjective:   1. Prediabetes Patient tried to cut metformin 500 mg in half, but she was unable to cut it due to the pill being very hard.  2. Emotional Eating Behavior Patient continues to work on decreasing emotional eating behavior.  She is doing well on her medications.  Assessment/Plan:   1. Prediabetes Patient is okay to crush and take one half dose of metformin.  We will see how this works and patient will continue with her diet.  2. Emotional Eating Behavior Patient will continue her medications, and we will refill Wellbutrin SR 150 mg twice daily #60 and Topamax 50 mg nightly #30 for 1 month.  3. BMI 33.0-33.9,adult  4. Obesity, Beginning BMI 36.21 Felicia Franklin is currently in the action stage of change. As such, her goal is to continue with weight loss efforts. She has agreed to change to the [employer].   Exercise goals: As is.  Behavioral modification strategies: increasing lean protein intake.  Phallon has agreed to follow-up with our clinic in 4 weeks. She was informed of the importance of frequent follow-up visits to maximize her success with intensive lifestyle modifications for her multiple health conditions.   Objective:   Blood  pressure 115/68, pulse 61, temperature 98.2 F (36.8 C), height 5\' 2"  (1.575 m), weight 185 lb (83.9 kg), last menstrual period 10/03/1998, SpO2 93%. Body mass index is 33.84 kg/m.  Lab Results  Component Value Date   CREATININE 0.94 04/18/2022   BUN 17 04/18/2022   NA 134 04/18/2022   K 4.7 04/18/2022   CL 99 04/18/2022   CO2 20 04/18/2022   Lab Results  Component Value Date   ALT 17 04/18/2022   AST 20 04/18/2022   ALKPHOS 109 04/18/2022   BILITOT 0.7 04/18/2022   Lab Results  Component Value Date   HGBA1C 5.2 04/18/2022   HGBA1C 5.5 07/12/2021   HGBA1C 5.6 10/08/2020   Lab Results  Component Value Date   INSULIN 10.5 04/18/2022   INSULIN 22.3 07/12/2021   INSULIN 23.3 10/08/2020   Lab Results  Component Value Date   TSH 2.190 04/18/2022   Lab Results  Component Value Date   CHOL 166 04/18/2022   HDL 61 04/18/2022   LDLCALC 88 04/18/2022   TRIG 93 04/18/2022   Lab Results  Component Value Date   VD25OH 66.9 04/18/2022   VD25OH 52.1 07/12/2021   VD25OH 66.0 10/08/2020   Lab Results  Component Value Date   WBC 5.9 04/18/2022   HGB 13.1 04/18/2022   HCT 39.5 04/18/2022   MCV 91 04/18/2022   PLT 255 04/18/2022   No results found for: "IRON", "TIBC", "FERRITIN"  Attestation Statements:   Reviewed by clinician on day of visit:  allergies, medications, problem list, medical history, surgical history, family history, social history, and previous encounter notes.   I, Burt Knack, am acting as transcriptionist for Quillian Quince, MD.  I have reviewed the above documentation for accuracy and completeness, and I agree with the above. -  Quillian Quince, MD

## 2023-04-26 DIAGNOSIS — I10 Essential (primary) hypertension: Secondary | ICD-10-CM | POA: Diagnosis not present

## 2023-04-26 DIAGNOSIS — E669 Obesity, unspecified: Secondary | ICD-10-CM | POA: Diagnosis not present

## 2023-04-26 DIAGNOSIS — I872 Venous insufficiency (chronic) (peripheral): Secondary | ICD-10-CM | POA: Diagnosis not present

## 2023-04-26 DIAGNOSIS — I480 Paroxysmal atrial fibrillation: Secondary | ICD-10-CM | POA: Diagnosis not present

## 2023-04-26 DIAGNOSIS — M1990 Unspecified osteoarthritis, unspecified site: Secondary | ICD-10-CM | POA: Diagnosis not present

## 2023-04-28 ENCOUNTER — Other Ambulatory Visit (HOSPITAL_BASED_OUTPATIENT_CLINIC_OR_DEPARTMENT_OTHER): Payer: Medicare PPO

## 2023-04-28 ENCOUNTER — Ambulatory Visit (HOSPITAL_BASED_OUTPATIENT_CLINIC_OR_DEPARTMENT_OTHER): Payer: Medicare PPO

## 2023-04-28 DIAGNOSIS — R0600 Dyspnea, unspecified: Secondary | ICD-10-CM | POA: Diagnosis not present

## 2023-04-28 LAB — ECHOCARDIOGRAM COMPLETE
AR max vel: 1.49 cm2
AV Area VTI: 1.51 cm2
AV Area mean vel: 1.49 cm2
AV Mean grad: 4 mmHg
AV Peak grad: 8.5 mmHg
Ao pk vel: 1.46 m/s
Area-P 1/2: 4.6 cm2
S' Lateral: 2.61 cm

## 2023-05-01 ENCOUNTER — Telehealth (HOSPITAL_BASED_OUTPATIENT_CLINIC_OR_DEPARTMENT_OTHER): Payer: Self-pay

## 2023-05-01 NOTE — Telephone Encounter (Signed)
Seen by patient Felicia Franklin on 05/01/2023 11:42 AM; follow up mychart message sent to patient.

## 2023-05-01 NOTE — Telephone Encounter (Addendum)
Left message for patient to call back     ----- Message from Alver Sorrow sent at 04/28/2023  4:44 PM EDT ----- Echocardiogram with normal heart pumping function. Mitral valve mildly leaky which is not of concern - we prevent this from worsening by keeping blood pressure well controlled. No significant abnormality that would contribute to shortness of breath.   If still significantly short of breath, recommend referral to pulmonology.

## 2023-05-09 DIAGNOSIS — E669 Obesity, unspecified: Secondary | ICD-10-CM | POA: Diagnosis not present

## 2023-05-09 DIAGNOSIS — M79642 Pain in left hand: Secondary | ICD-10-CM | POA: Diagnosis not present

## 2023-05-09 DIAGNOSIS — Z6835 Body mass index (BMI) 35.0-35.9, adult: Secondary | ICD-10-CM | POA: Diagnosis not present

## 2023-05-09 DIAGNOSIS — M25571 Pain in right ankle and joints of right foot: Secondary | ICD-10-CM | POA: Diagnosis not present

## 2023-05-09 DIAGNOSIS — R768 Other specified abnormal immunological findings in serum: Secondary | ICD-10-CM | POA: Diagnosis not present

## 2023-05-09 DIAGNOSIS — M25562 Pain in left knee: Secondary | ICD-10-CM | POA: Diagnosis not present

## 2023-05-09 DIAGNOSIS — M059 Rheumatoid arthritis with rheumatoid factor, unspecified: Secondary | ICD-10-CM | POA: Diagnosis not present

## 2023-05-09 DIAGNOSIS — M79641 Pain in right hand: Secondary | ICD-10-CM | POA: Diagnosis not present

## 2023-05-09 DIAGNOSIS — M154 Erosive (osteo)arthritis: Secondary | ICD-10-CM | POA: Diagnosis not present

## 2023-05-11 ENCOUNTER — Telehealth: Payer: Self-pay | Admitting: Family

## 2023-05-11 DIAGNOSIS — R0609 Other forms of dyspnea: Secondary | ICD-10-CM

## 2023-05-11 NOTE — Telephone Encounter (Signed)
Patient would like to discuss a medication another provider put her on. Patient declined discussing in detail with me. She requested to speak directly with Gillian Shields if at all possible.

## 2023-05-11 NOTE — Telephone Encounter (Signed)
Spoke with [facility], Dr. Nickola Major, added Hydroxychloroquine and Miss Pyron wanted to ensure was safe given PAF. Discussed rare instance of AV block, SSS. Will set  up for nurse visit for EKG about 4 weeks after starting medication to ensure stability.  She is interested in previously discussed pulmonology referral. She was concerned she would just being told she needed to lose weight as she has been working with [facility] on weight loss. Reassured that pulmonology provides thorough workup. As recent echo unremarkable with no cause of persistent exertional dyspnea, will refer to pulmonary. She refers [employer], materials for nurse visit September 13th at 1pm for EKG and BP check.   Alver Sorrow, NP

## 2023-05-19 ENCOUNTER — Other Ambulatory Visit (INDEPENDENT_AMBULATORY_CARE_PROVIDER_SITE_OTHER): Payer: Self-pay | Admitting: Family Medicine

## 2023-05-19 DIAGNOSIS — F3289 Other specified depressive episodes: Secondary | ICD-10-CM

## 2023-05-22 DIAGNOSIS — H35363 Drusen (degenerative) of macula, bilateral: Secondary | ICD-10-CM | POA: Diagnosis not present

## 2023-05-22 DIAGNOSIS — G35 Multiple sclerosis: Secondary | ICD-10-CM | POA: Diagnosis not present

## 2023-05-22 DIAGNOSIS — H04123 Dry eye syndrome of bilateral lacrimal glands: Secondary | ICD-10-CM | POA: Diagnosis not present

## 2023-05-22 DIAGNOSIS — H25813 Combined forms of age-related cataract, bilateral: Secondary | ICD-10-CM | POA: Diagnosis not present

## 2023-05-23 ENCOUNTER — Encounter (INDEPENDENT_AMBULATORY_CARE_PROVIDER_SITE_OTHER): Payer: Self-pay | Admitting: Family Medicine

## 2023-05-23 ENCOUNTER — Ambulatory Visit (INDEPENDENT_AMBULATORY_CARE_PROVIDER_SITE_OTHER): Payer: Medicare PPO | Admitting: Family Medicine

## 2023-05-23 VITALS — BP 133/73 | HR 64 | Temp 98.0°F | Ht 62.0 in | Wt 183.0 lb

## 2023-05-23 DIAGNOSIS — R7303 Prediabetes: Secondary | ICD-10-CM | POA: Diagnosis not present

## 2023-05-23 DIAGNOSIS — F3289 Other specified depressive episodes: Secondary | ICD-10-CM

## 2023-05-23 DIAGNOSIS — E669 Obesity, unspecified: Secondary | ICD-10-CM | POA: Diagnosis not present

## 2023-05-23 DIAGNOSIS — Z6833 Body mass index (BMI) 33.0-33.9, adult: Secondary | ICD-10-CM | POA: Diagnosis not present

## 2023-05-23 MED ORDER — TOPIRAMATE 50 MG PO TABS
50.0000 mg | ORAL_TABLET | Freq: Every day | ORAL | 0 refills | Status: DC
Start: 2023-05-23 — End: 2023-06-26

## 2023-05-24 NOTE — Progress Notes (Signed)
Chief Complaint:   OBESITY Felicia Franklin is here to discuss her progress with her obesity treatment plan along with follow-up of her obesity related diagnoses. Felicia Franklin is on the [employer] and states she is following her eating plan approximately 30% of the time. Felicia Franklin states she is doing water exercises for 75-120 minutes 5-6 times per week.  Today's visit was #: 38 Starting weight: 198 lbs Starting date: 10/08/2020 Today's weight: 183 lbs Today's date: 05/23/2023 Total lbs lost to date: 15 Total lbs lost since last in-office visit: 2  Interim History: Patient has done well with her weight loss.  She dislikes planning meals.  She is doing well with increasing her protein, and she is staying active with exercise.  Subjective:   1. Prediabetes Patient had GI upset with 500 mg of metformin.  She has been unable to cut it in half.  2. Emotional Eating Behavior Patient is stable on her medications, and she notes mild dysgeusia with diet Coke.  Assessment/Plan:   1. Prediabetes Patient agreed to discontinue metformin, and she will continue with her diet and exercise.  2. Emotional Eating Behavior Patient will continue Topamax 50 mg nightly, and we will refill for 1 month.  - topiramate (TOPAMAX) 50 MG tablet; Take 1 tablet (50 mg total) by mouth at bedtime.  Dispense: 30 tablet; Refill: 0  3. BMI 33.0-33.9,adult  4. Obesity, Beginning BMI 36.21 Felicia Franklin is currently in the action stage of change. As such, her goal is to continue with weight loss efforts. She has agreed to practicing portion control and making smarter food choices, such as increasing vegetables and decreasing simple carbohydrates.   Exercise goals: As is.   Behavioral modification strategies: increasing lean protein intake.  Felicia Franklin has agreed to follow-up with our clinic in 4 weeks. She was informed of the importance of frequent follow-up visits to maximize her success with intensive lifestyle modifications for her  multiple health conditions.   Objective:   Blood pressure 133/73, pulse 64, temperature 98 F (36.7 C), height 5\' 2"  (1.575 m), weight 183 lb (83 kg), last menstrual period 10/03/1998, SpO2 97%. Body mass index is 33.47 kg/m.  Lab Results  Component Value Date   CREATININE 0.94 04/18/2022   BUN 17 04/18/2022   NA 134 04/18/2022   K 4.7 04/18/2022   CL 99 04/18/2022   CO2 20 04/18/2022   Lab Results  Component Value Date   ALT 17 04/18/2022   AST 20 04/18/2022   ALKPHOS 109 04/18/2022   BILITOT 0.7 04/18/2022   Lab Results  Component Value Date   HGBA1C 5.2 04/18/2022   HGBA1C 5.5 07/12/2021   HGBA1C 5.6 10/08/2020   Lab Results  Component Value Date   INSULIN 10.5 04/18/2022   INSULIN 22.3 07/12/2021   INSULIN 23.3 10/08/2020   Lab Results  Component Value Date   TSH 2.190 04/18/2022   Lab Results  Component Value Date   CHOL 166 04/18/2022   HDL 61 04/18/2022   LDLCALC 88 04/18/2022   TRIG 93 04/18/2022   Lab Results  Component Value Date   VD25OH 66.9 04/18/2022   VD25OH 52.1 07/12/2021   VD25OH 66.0 10/08/2020   Lab Results  Component Value Date   WBC 5.9 04/18/2022   HGB 13.1 04/18/2022   HCT 39.5 04/18/2022   MCV 91 04/18/2022   PLT 255 04/18/2022   No results found for: "IRON", "TIBC", "FERRITIN"  Attestation Statements:   Reviewed by clinician on day of visit:  allergies, medications, problem list, medical history, surgical history, family history, social history, and previous encounter notes.   I, Burt Knack, am acting as transcriptionist for Quillian Quince, MD.  I have reviewed the above documentation for accuracy and completeness, and I agree with the above. -  Quillian Quince, MD

## 2023-05-26 DIAGNOSIS — L282 Other prurigo: Secondary | ICD-10-CM | POA: Diagnosis not present

## 2023-05-26 DIAGNOSIS — M1990 Unspecified osteoarthritis, unspecified site: Secondary | ICD-10-CM | POA: Diagnosis not present

## 2023-06-09 DIAGNOSIS — D1801 Hemangioma of skin and subcutaneous tissue: Secondary | ICD-10-CM | POA: Diagnosis not present

## 2023-06-09 DIAGNOSIS — Z85828 Personal history of other malignant neoplasm of skin: Secondary | ICD-10-CM | POA: Diagnosis not present

## 2023-06-09 DIAGNOSIS — L270 Generalized skin eruption due to drugs and medicaments taken internally: Secondary | ICD-10-CM | POA: Diagnosis not present

## 2023-06-09 DIAGNOSIS — C4441 Basal cell carcinoma of skin of scalp and neck: Secondary | ICD-10-CM | POA: Diagnosis not present

## 2023-06-16 ENCOUNTER — Telehealth (HOSPITAL_BASED_OUTPATIENT_CLINIC_OR_DEPARTMENT_OTHER): Payer: Self-pay | Admitting: *Deleted

## 2023-06-16 ENCOUNTER — Ambulatory Visit (HOSPITAL_BASED_OUTPATIENT_CLINIC_OR_DEPARTMENT_OTHER): Payer: Medicare PPO | Admitting: *Deleted

## 2023-06-16 ENCOUNTER — Encounter (HOSPITAL_BASED_OUTPATIENT_CLINIC_OR_DEPARTMENT_OTHER): Payer: Self-pay

## 2023-06-16 VITALS — Wt 191.8 lb

## 2023-06-16 NOTE — Telephone Encounter (Signed)
Patient came in for nurse visit, EKG & blood pressure check  Was scheduled to follow up on the Hydroxychloroquine  Patient has not been taking secondary to rash, no need for visit Advised patient, verbalized understanding

## 2023-06-20 ENCOUNTER — Other Ambulatory Visit (INDEPENDENT_AMBULATORY_CARE_PROVIDER_SITE_OTHER): Payer: Self-pay | Admitting: Family Medicine

## 2023-06-20 DIAGNOSIS — F3289 Other specified depressive episodes: Secondary | ICD-10-CM

## 2023-06-21 ENCOUNTER — Ambulatory Visit (INDEPENDENT_AMBULATORY_CARE_PROVIDER_SITE_OTHER): Payer: Medicare PPO | Admitting: Family Medicine

## 2023-06-26 ENCOUNTER — Ambulatory Visit (INDEPENDENT_AMBULATORY_CARE_PROVIDER_SITE_OTHER): Payer: Medicare PPO | Admitting: Family Medicine

## 2023-06-26 ENCOUNTER — Encounter (INDEPENDENT_AMBULATORY_CARE_PROVIDER_SITE_OTHER): Payer: Self-pay | Admitting: Family Medicine

## 2023-06-26 VITALS — BP 120/69 | HR 52 | Temp 97.7°F | Ht 62.0 in | Wt 187.0 lb

## 2023-06-26 DIAGNOSIS — Z6834 Body mass index (BMI) 34.0-34.9, adult: Secondary | ICD-10-CM

## 2023-06-26 DIAGNOSIS — I10 Essential (primary) hypertension: Secondary | ICD-10-CM

## 2023-06-26 DIAGNOSIS — F3289 Other specified depressive episodes: Secondary | ICD-10-CM

## 2023-06-26 DIAGNOSIS — F5089 Other specified eating disorder: Secondary | ICD-10-CM

## 2023-06-26 DIAGNOSIS — E669 Obesity, unspecified: Secondary | ICD-10-CM | POA: Diagnosis not present

## 2023-06-26 MED ORDER — TOPIRAMATE 50 MG PO TABS
50.0000 mg | ORAL_TABLET | Freq: Every day | ORAL | 0 refills | Status: DC
Start: 2023-06-26 — End: 2023-07-24

## 2023-06-26 MED ORDER — BUPROPION HCL ER (SR) 150 MG PO TB12
150.0000 mg | ORAL_TABLET | Freq: Two times a day (BID) | ORAL | 0 refills | Status: DC
Start: 2023-06-26 — End: 2023-11-01

## 2023-06-26 NOTE — Progress Notes (Signed)
Chief Complaint:   OBESITY Felicia Franklin is here to discuss her progress with her obesity treatment plan along with follow-up of her obesity related diagnoses. Ritisha is on practicing portion control and making smarter food choices, such as increasing vegetables and decreasing simple carbohydrates and states she is following her eating plan approximately 20% of the time. Nyazia states she is doing water exercise for 105 minutes 5 times per week.  Today's visit was #: 39 Starting weight: 198 lbs Starting date: 10/08/2020 Today's weight: 187 lbs Today's date: 06/26/2023 Total lbs lost to date: 11 Total lbs lost since last in-office visit: 0  Interim History: Patient has struggled with weight loss recently.  She was unable to swim while having a drug rash.  She is getting back on track.  Subjective:   1. Essential hypertension Patient's blood pressure is well-controlled on her medications, and she is working on her diet but she has struggled more this last month.  2. Emotional Eating Behavior Patient had increased stressors and increased stress eating over the last month.  No medication side effects were mentioned on Topamax and Wellbutrin.  She is working on getting back on track.  Assessment/Plan:   1. Essential hypertension Patient will work on getting back to her diet and weight loss to help control her blood pressure.  2. Emotional Eating Behavior Patient will continue her medications, and we will refill Topamax 50 mg and Wellbutrin SR 150 mg twice daily for 90 days.  - topiramate (TOPAMAX) 50 MG tablet; Take 1 tablet (50 mg total) by mouth at bedtime.  Dispense: 90 tablet; Refill: 0 - buPROPion (WELLBUTRIN SR) 150 MG 12 hr tablet; Take 1 tablet (150 mg total) by mouth 2 (two) times daily.  Dispense: 180 tablet; Refill: 0  3. BMI 34.0-34.9,adult  4. Obesity, Beginning BMI 36.21 Felicia Franklin is currently in the action stage of change. As such, her goal is to continue with weight loss  efforts. She has agreed to practicing portion control and making smarter food choices, such as increasing vegetables and decreasing simple carbohydrates.   Exercise goals: As is.   Behavioral modification strategies: increasing lean protein intake and meal planning and cooking strategies.  Felicia Franklin has agreed to follow-up with our clinic in 4 weeks. She was informed of the importance of frequent follow-up visits to maximize her success with intensive lifestyle modifications for her multiple health conditions.   Objective:   Blood pressure 120/69, pulse (!) 52, temperature 97.7 F (36.5 C), height 5\' 2"  (1.575 m), weight 187 lb (84.8 kg), last menstrual period 10/03/1998, SpO2 97%. Body mass index is 34.2 kg/m.  Lab Results  Component Value Date   CREATININE 0.94 04/18/2022   BUN 17 04/18/2022   NA 134 04/18/2022   K 4.7 04/18/2022   CL 99 04/18/2022   CO2 20 04/18/2022   Lab Results  Component Value Date   ALT 17 04/18/2022   AST 20 04/18/2022   ALKPHOS 109 04/18/2022   BILITOT 0.7 04/18/2022   Lab Results  Component Value Date   HGBA1C 5.2 04/18/2022   HGBA1C 5.5 07/12/2021   HGBA1C 5.6 10/08/2020   Lab Results  Component Value Date   INSULIN 10.5 04/18/2022   INSULIN 22.3 07/12/2021   INSULIN 23.3 10/08/2020   Lab Results  Component Value Date   TSH 2.190 04/18/2022   Lab Results  Component Value Date   CHOL 166 04/18/2022   HDL 61 04/18/2022   LDLCALC 88 04/18/2022   TRIG  93 04/18/2022   Lab Results  Component Value Date   VD25OH 66.9 04/18/2022   VD25OH 52.1 07/12/2021   VD25OH 66.0 10/08/2020   Lab Results  Component Value Date   WBC 5.9 04/18/2022   HGB 13.1 04/18/2022   HCT 39.5 04/18/2022   MCV 91 04/18/2022   PLT 255 04/18/2022   No results found for: "IRON", "TIBC", "FERRITIN"  Attestation Statements:   Reviewed by clinician on day of visit: allergies, medications, problem list, medical history, surgical history, family history, social  history, and previous encounter notes.   I, Burt Knack, am acting as transcriptionist for Quillian Quince, MD.  I have reviewed the above documentation for accuracy and completeness, and I agree with the above. -  Quillian Quince, MD

## 2023-07-05 DIAGNOSIS — Z1231 Encounter for screening mammogram for malignant neoplasm of breast: Secondary | ICD-10-CM | POA: Diagnosis not present

## 2023-07-13 DIAGNOSIS — R922 Inconclusive mammogram: Secondary | ICD-10-CM | POA: Diagnosis not present

## 2023-07-24 ENCOUNTER — Ambulatory Visit (INDEPENDENT_AMBULATORY_CARE_PROVIDER_SITE_OTHER): Payer: Medicare PPO | Admitting: Family Medicine

## 2023-07-24 ENCOUNTER — Encounter (INDEPENDENT_AMBULATORY_CARE_PROVIDER_SITE_OTHER): Payer: Self-pay | Admitting: Family Medicine

## 2023-07-24 VITALS — BP 125/70 | HR 54 | Temp 98.2°F | Ht 62.0 in | Wt 188.0 lb

## 2023-07-24 DIAGNOSIS — R7303 Prediabetes: Secondary | ICD-10-CM

## 2023-07-24 DIAGNOSIS — E669 Obesity, unspecified: Secondary | ICD-10-CM | POA: Diagnosis not present

## 2023-07-24 DIAGNOSIS — Z6834 Body mass index (BMI) 34.0-34.9, adult: Secondary | ICD-10-CM

## 2023-07-24 DIAGNOSIS — I10 Essential (primary) hypertension: Secondary | ICD-10-CM

## 2023-07-24 DIAGNOSIS — F5089 Other specified eating disorder: Secondary | ICD-10-CM

## 2023-07-24 DIAGNOSIS — F3289 Other specified depressive episodes: Secondary | ICD-10-CM

## 2023-07-24 MED ORDER — TOPIRAMATE 100 MG PO TABS
100.0000 mg | ORAL_TABLET | Freq: Every day | ORAL | 0 refills | Status: DC
Start: 2023-07-24 — End: 2023-11-01

## 2023-07-24 NOTE — Progress Notes (Signed)
.smr  Office: [phone]  /  Fax: [phone]  WEIGHT SUMMARY AND BIOMETRICS  Anthropometric Measurements Height: 5\' 2"  (1.575 m) Weight: 188 lb (85.3 kg) BMI (Calculated): 34.38 Weight at Last Visit: 187 lb Weight Lost Since Last Visit: 0 Weight Gained Since Last Visit: 1 lb Starting Weight: 198 lb Total Weight Loss (lbs): 10 lb (4.536 kg)   Body Composition  Body Fat %: 48.6 % Fat Mass (lbs): 91.4 lbs Muscle Mass (lbs): 91.8 lbs Total Body Water (lbs): 69.4 lbs Visceral Fat Rating : 15   Other Clinical Data Fasting: No Labs: No Today's Visit #: 40 Starting Date: 10/08/20    Chief Complaint: OBESITY  History of Present Illness   The patient, with a history of hypertension, prediabetes, depression with emotional eating behaviors, and obesity, presents for a routine follow-up. Her hypertension is well-controlled on amlodipine, with a recent reading of 125/70. She has been actively working on diet and exercise to further manage her blood pressure.  For her depression and emotional eating behaviors, she is on Wellbutrin and topiramate. Despite adherence to these medications, she reports an increase in hunger and potential worsening of emotional eating behaviors. She has gained one pound in the last month and reports following her prescribed eating plan approximately 20% of the time. She is engaging in water exercises for approximately 90 minutes, five times per week.  The patient has prediabetes and is not currently on any medications for this. She has been experiencing challenges with her diet, particularly in restaurant settings, where she struggles to order salads due to temperature preferences. She reports a preference for protein-based salads but finds it difficult to order these consistently.  She also reports a recent increase in social eating occasions, which has led to some dietary challenges, including inadequate protein intake. She expresses a desire to improve  her dietary habits, particularly with the upcoming holiday season.  The patient also reports a potential issue with medication adherence, specifically with remembering the second dose of her medications. She expresses willingness to try an increased dose of topiramate to manage her emotional eating behaviors. She has not experienced any significant side effects from her current medications.          PHYSICAL EXAM:  Blood pressure 125/70, pulse (!) 54, temperature 98.2 F (36.8 C), height 5\' 2"  (1.575 m), weight 188 lb (85.3 kg), last menstrual period 10/03/1998, SpO2 96%. Body mass index is 34.39 kg/m.  DIAGNOSTIC DATA REVIEWED:  BMET    Component Value Date/Time   NA 134 04/18/2022 0846   K 4.7 04/18/2022 0846   CL 99 04/18/2022 0846   CO2 20 04/18/2022 0846   GLUCOSE 80 04/18/2022 0846   GLUCOSE 91 11/21/2013 1310   BUN 17 04/18/2022 0846   CREATININE 0.94 04/18/2022 0846   CALCIUM 9.8 04/18/2022 0846   GFRNONAA 71 10/08/2020 0923   GFRAA 82 10/08/2020 0923   Lab Results  Component Value Date   HGBA1C 5.2 04/18/2022   HGBA1C 5.6 10/08/2020   Lab Results  Component Value Date   INSULIN 10.5 04/18/2022   INSULIN 23.3 10/08/2020   Lab Results  Component Value Date   TSH 2.190 04/18/2022   CBC    Component Value Date/Time   WBC 5.9 04/18/2022 0846   RBC 4.33 04/18/2022 0846   HGB 13.1 04/18/2022 0846   HCT 39.5 04/18/2022 0846   PLT 255 04/18/2022 0846   MCV 91 04/18/2022 0846   MCH 30.3 04/18/2022 0846   MCHC 33.2 04/18/2022  0846   RDW 13.1 04/18/2022 0846   Iron Studies No results found for: "IRON", "TIBC", "FERRITIN", "IRONPCTSAT" Lipid Panel     Component Value Date/Time   CHOL 166 04/18/2022 0846   TRIG 93 04/18/2022 0846   HDL 61 04/18/2022 0846   LDLCALC 88 04/18/2022 0846   Hepatic Function Panel     Component Value Date/Time   PROT 7.2 04/18/2022 0846   ALBUMIN 4.3 04/18/2022 0846   AST 20 04/18/2022 0846   ALT 17 04/18/2022 0846    ALKPHOS 109 04/18/2022 0846   BILITOT 0.7 04/18/2022 0846      Component Value Date/Time   TSH 2.190 04/18/2022 0846   Nutritional Lab Results  Component Value Date   VD25OH 66.9 04/18/2022   VD25OH 52.1 07/12/2021   VD25OH 66.0 10/08/2020     Assessment and Plan    Hypertension Well controlled on Amlodipine. Blood pressure today is 125/70. -Continue Amlodipine and maintain current diet and exercise regimen.    Emotional Eating Behaviors Patient reports increased hunger and difficulty with consistent healthy eating. Currently on Wellbutrin and Topiramate. -Increase Topiramate to 100mg  daily. -Continue Wellbutrin. -Encourage continued focus on protein intake and mindful eating.  Prediabetes No current medications. Patient is working on diet, exercise, and weight loss. -Continue current lifestyle modifications.  Obesity Patient has gained one pound in the last month. She is following the category two eating plan approximately 20% of the time and doing water exercises approximately 90 minutes five times per week. -Continue current exercise regimen. -Encourage increased adherence to [place] eating plan -Check weight at next visit.  Follow-up in 1 month.       She was informed of the importance of frequent follow up visits to maximize her success with intensive lifestyle modifications for her multiple health conditions.    Quillian Quince, MD

## 2023-07-28 ENCOUNTER — Encounter (HOSPITAL_BASED_OUTPATIENT_CLINIC_OR_DEPARTMENT_OTHER): Payer: Self-pay | Admitting: Family

## 2023-07-28 ENCOUNTER — Ambulatory Visit (HOSPITAL_BASED_OUTPATIENT_CLINIC_OR_DEPARTMENT_OTHER): Payer: Medicare PPO | Admitting: Family

## 2023-07-28 VITALS — BP 112/74 | HR 55 | Ht 61.75 in | Wt 191.8 lb

## 2023-07-28 DIAGNOSIS — D6859 Other primary thrombophilia: Secondary | ICD-10-CM | POA: Diagnosis not present

## 2023-07-28 DIAGNOSIS — I10 Essential (primary) hypertension: Secondary | ICD-10-CM | POA: Diagnosis not present

## 2023-07-28 DIAGNOSIS — E782 Mixed hyperlipidemia: Secondary | ICD-10-CM

## 2023-07-28 DIAGNOSIS — G4733 Obstructive sleep apnea (adult) (pediatric): Secondary | ICD-10-CM | POA: Diagnosis not present

## 2023-07-28 DIAGNOSIS — I480 Paroxysmal atrial fibrillation: Secondary | ICD-10-CM | POA: Diagnosis not present

## 2023-07-28 LAB — BASIC METABOLIC PANEL
BUN/Creatinine Ratio: 24 (ref 12–28)
BUN: 24 mg/dL (ref 8–27)
CO2: 21 mmol/L (ref 20–29)
Calcium: 10 mg/dL (ref 8.7–10.3)
Chloride: 101 mmol/L (ref 96–106)
Creatinine, Ser: 1.01 mg/dL — ABNORMAL HIGH (ref 0.57–1.00)
Glucose: 97 mg/dL (ref 70–99)
Potassium: 4.4 mmol/L (ref 3.5–5.2)
Sodium: 137 mmol/L (ref 134–144)
eGFR: 58 mL/min/{1.73_m2} — ABNORMAL LOW (ref >59)

## 2023-07-28 LAB — CBC
Hematocrit: 41.1 % (ref 34.0–46.6)
Hemoglobin: 13.3 g/dL (ref 11.1–15.9)
MCH: 30 pg (ref 26.6–33.0)
MCHC: 32.4 g/dL (ref 31.5–35.7)
MCV: 93 fL (ref 79–97)
Platelets: 268 10*3/uL (ref 150–450)
RBC: 4.43 x10E6/uL (ref 3.77–5.28)
RDW: 13.3 % (ref 11.7–15.4)
WBC: 6 10*3/uL (ref 3.4–10.8)

## 2023-07-28 NOTE — Patient Instructions (Signed)
Medication Instructions:  Your physician recommends that you continue on your current medications as directed. Please refer to the Current Medication list given to you today.  *If you need a refill on your cardiac medications before your next appointment, please call your pharmacy*  Lab Work:  CBC and BMP   If you have labs (blood work) drawn today and your tests are completely normal, you will receive your results only by: MyChart Message (if you have MyChart) OR A paper copy in the mail If you have any lab test that is abnormal or we need to change your treatment, we will call you to review the results.  Follow-Up: At [facility], you and your health needs are our priority.  As part of our continuing mission to provide you with exceptional heart care, we have created designated Provider Care Teams.  These Care Teams include your primary Cardiologist (physician) and Advanced Practice Providers (APPs -  Physician Assistants and Nurse Practitioners) who all work together to provide you with the care you need, when you need it.  We recommend signing up for the patient portal called "MyChart".  Sign up information is provided on this After Visit Summary.  MyChart is used to connect with patients for Virtual Visits (Telemedicine).  Patients are able to view lab/test results, encounter notes, upcoming appointments, etc.  Non-urgent messages can be sent to your provider as well.   To learn more about what you can do with MyChart, go to [email or web address].    Your next appointment:   6 month(s)  Provider:   Gillian Shields, NP

## 2023-07-28 NOTE — Progress Notes (Signed)
Cardiology Office Note:  .   Date:  07/28/2023  ID:  Felicia Franklin, DOB 07-06-1946, MRN [id number] PCP: Geoffry Paradise, MD  [facility] Providers Cardiologist:  Chilton Si, MD    History of Present Illness: .   Felicia Franklin is a 77 y.o. female hx of hypertension, hyperlipidemia, paroxysmal atrial fibrillation, arthritis, sleep apnea, anxiety.   Diagnosed with atrial fibrillation in 2014.  Initially evaluated 07/2021 with dyspnea with echocardiogram revealing LVEF 63%.  Nuclear stress test 09/2021 LVEF 67%, no ischemia.   At visit 03/2022 noted occasional episode of lightheadedness recommended for ambulatory monitor if increased symptoms. Seen 10/21/22 with BP mildly elevated recommended for home monitoring. She had no recurrent lightheadedness though a week later noted recurrent episodes and ambulatory monitor ordered. Monitor with 21% atrial fibrillation burden which was rate controlled.  At follow-up 11/22/2022 for optimal rate control and improvement in blood pressure Bystolic was increased to 20 mg daily.  Last seen 03/27/23. Due to dyspnea, updated echo 04/28/23 normal LVEF 60-65%, mild MR.    Presents today for follow up.  Exercising a couple days per week with water aerobics at [facility]  Reports no shortness of breath nor dyspnea on exertion. Reports no chest pain, pressure, or tightness. No edema, orthopnea, PND. Reports no palpitations.     ROS: Please see the history of present illness.    All other systems reviewed and are negative.   Studies Reviewed: .        Cardiac Studies & Procedures       ECHOCARDIOGRAM  ECHOCARDIOGRAM COMPLETE 04/28/2023  Narrative ECHOCARDIOGRAM REPORT    Patient Name:   Felicia Franklin Date of Exam: 04/28/2023 Medical Rec #:  [id number]       Height:       62.0 in Accession #:    [id number]      Weight:       185.0 lb Date of Birth:  Feb 23, 1946       BSA:          1.849 m Patient Age:    77 years        BP:            122/68 mmHg Patient Gender: F               HR:           63 bpm. Exam Location:  Outpatient  Procedure: 2D Echo, Cardiac Doppler, Color Doppler and 3D Echo  Indications:    R06.9 DOE; R60.0 Lower extremity edema  History:        Patient has prior history of Echocardiogram examinations, most recent 08/02/2021. Arrythmias:Atrial Fibrillation, Signs/Symptoms:Dyspnea and Edema; Risk Factors:Hypertension, Sleep Apnea, Dyslipidemia and Non-Smoker. Patient denies chest pain. She does have DOE and leg edema.  Sonographer:    Carlos American RVT, RDCS (AE), RDMS Referring Phys: [id number] Alver Sorrow   Sonographer Comments: Patient is obese. Image acquisition challenging due to patient body habitus. Global longitudinal strain was attempted. IMPRESSIONS   1. Left ventricular ejection fraction, by estimation, is 60 to 65%. The left ventricle has normal function. The left ventricle has no regional wall motion abnormalities. Left ventricular diastolic parameters are consistent with Grade I diastolic dysfunction (impaired relaxation). 2. Right ventricular systolic function is normal. The right ventricular size is normal. 3. Left atrial size was moderately dilated. 4. The mitral valve is normal in structure. Mild mitral valve regurgitation. No evidence of mitral stenosis. 5. The aortic valve  is tricuspid. Aortic valve regurgitation is not visualized. Aortic valve sclerosis is present, with no evidence of aortic valve stenosis. 6. The inferior vena cava is normal in size with greater than 50% respiratory variability, suggesting right atrial pressure of 3 mmHg.  Comparison(s): No prior Echocardiogram. EF 63%.  FINDINGS Left Ventricle: Left ventricular ejection fraction, by estimation, is 60 to 65%. The left ventricle has normal function. The left ventricle has no regional wall motion abnormalities. The left ventricular internal cavity size was normal in size. There is no left ventricular  hypertrophy. Left ventricular diastolic parameters are consistent with Grade I diastolic dysfunction (impaired relaxation).  Right Ventricle: The right ventricular size is normal. Right ventricular systolic function is normal.  Left Atrium: Left atrial size was moderately dilated.  Right Atrium: Right atrial size was normal in size.  Pericardium: There is no evidence of pericardial effusion.  Mitral Valve: The mitral valve is normal in structure. Mild mitral annular calcification. Mild mitral valve regurgitation. No evidence of mitral valve stenosis.  Tricuspid Valve: The tricuspid valve is normal in structure. Tricuspid valve regurgitation is mild . No evidence of tricuspid stenosis.  Aortic Valve: The aortic valve is tricuspid. Aortic valve regurgitation is not visualized. Aortic valve sclerosis is present, with no evidence of aortic valve stenosis. Aortic valve mean gradient measures 4.0 mmHg. Aortic valve peak gradient measures 8.5 mmHg.  Pulmonic Valve: The pulmonic valve was normal in structure. Pulmonic valve regurgitation is trivial. No evidence of pulmonic stenosis.  Aorta: The aortic root is normal in size and structure.  Venous: The inferior vena cava is normal in size with greater than 50% respiratory variability, suggesting right atrial pressure of 3 mmHg.  IAS/Shunts: No atrial level shunt detected by color flow Doppler.   LEFT VENTRICLE PLAX 2D LVIDd:         4.28 cm   Diastology LVIDs:         2.61 cm   LV e' medial:    6.85 cm/s LV PW:         1.07 cm   LV E/e' medial:  13.8 LV IVS:        1.05 cm   LV e' lateral:   7.29 cm/s LVOT diam:     1.60 cm   LV E/e' lateral: 12.9 LV SV:         56 LV SV Index:   31 LVOT Area:     2.01 cm  3D Volume EF: 3D EF:        72 % LV EDV:       112 ml LV ESV:       32 ml LV SV:        80 ml  RIGHT VENTRICLE RV S prime:     21.60 cm/s TAPSE (M-mode): 3.9 cm  LEFT ATRIUM             Index        RIGHT ATRIUM            Index LA diam:        4.10 cm 2.22 cm/m   RA Area:     14.00 cm LA Vol (A2C):   80.6 ml 43.58 ml/m  RA Volume:   31.90 ml  17.25 ml/m LA Vol (A4C):   83.6 ml 45.20 ml/m LA Biplane Vol: 82.9 ml 44.83 ml/m AORTIC VALVE                    PULMONIC VALVE  AV Area (Vmax):    1.49 cm     PV Vmax:          0.92 m/s AV Area (Vmean):   1.49 cm     PV Peak grad:     3.4 mmHg AV Area (VTI):     1.51 cm     PR End Diast Vel: 3.06 msec AV Vmax:           146.00 cm/s AV Vmean:          94.900 cm/s AV VTI:            0.374 m AV Peak Grad:      8.5 mmHg AV Mean Grad:      4.0 mmHg LVOT Vmax:         108.00 cm/s LVOT Vmean:        70.100 cm/s LVOT VTI:          0.281 m LVOT/AV VTI ratio: 0.75  AORTA Ao Root diam: 2.70 cm Ao Asc diam:  2.90 cm Ao Arch diam: 3.2 cm  MITRAL VALVE               TRICUSPID VALVE MV Area (PHT): 4.60 cm    TR Peak grad:   30.2 mmHg MV Decel Time: 165 msec    TR Vmax:        275.00 cm/s MV E velocity: 94.40 cm/s MV A velocity: 89.50 cm/s  SHUNTS MV E/A ratio:  1.05        Systemic VTI:  0.28 m Systemic Diam: 1.60 cm  Olga Millers MD Electronically signed by Olga Millers MD Signature Date/Time: 04/28/2023/3:10:42 PM    Final    MONITORS  CARDIAC EVENT MONITOR 11/21/2022  Narrative 14 Day Event Monitor  Quality: Fair.  Baseline artifact. Predominant rhythm: Sinus rhythm.  21% atrial fibrillation. Average heart rate: 65 bpm Max heart rate: 111 bpm Min heart rate: 46 bpm Pauses >2.5 seconds: none  Rare (<1%) PACs and PVCs  Tiffany C. Duke Salvia, MD, [facility] 12/16/2022 10:52 PM   CT SCANS  CT CARDIAC SCORING (SELF PAY ONLY) 04/01/2022  Addendum 04/03/2022  4:50 PM ADDENDUM REPORT: 04/03/2022 16:48  CLINICAL DATA:  Cardiovascular Disease Risk stratification  EXAM: Coronary Calcium Score  TECHNIQUE: A gated, non-contrast computed tomography scan of the heart was performed using 3mm slice thickness. Axial images were analyzed on a dedicated  workstation. Calcium scoring of the coronary arteries was performed using the Agatston method.  FINDINGS: Coronary arteries: Normal origins.  Coronary Calcium Score:  Left main:  Left anterior descending artery:  Left circumflex artery:  Right coronary artery:  Total: 0  Percentile:  Pericardium: Normal.  Ascending Aorta: Normal caliber.  Non-cardiac: See separate report from [facility].  IMPRESSION: Coronary calcium score of 0.  RECOMMENDATIONS: Coronary artery calcium (CAC) score is a strong predictor of incident coronary heart disease (CHD) and provides predictive information beyond traditional risk factors. CAC scoring is reasonable to use in the decision to withhold, postpone, or initiate statin therapy in intermediate-risk or selected borderline-risk asymptomatic adults (age 12-75 years and LDL-C >=70 to <190 mg/dL) who do not have diabetes or established atherosclerotic cardiovascular disease (ASCVD).* In intermediate-risk (10-year ASCVD risk >=7.5% to <20%) adults or selected borderline-risk (10-year ASCVD risk >=5% to <7.5%) adults in whom a CAC score is measured for the purpose of making a treatment decision the following recommendations have been made:  If CAC=0, it is reasonable to withhold statin therapy and reassess in 5  to 10 years, as long as higher risk conditions are absent (diabetes mellitus, family history of premature CHD in first degree relatives (males <55 years; females <65 years), cigarette smoking, or LDL >=190 mg/dL).  If CAC is 1 to 99, it is reasonable to initiate statin therapy for patients >=60 years of age.  If CAC is >=100 or >=75th percentile, it is reasonable to initiate statin therapy at any age.  Cardiology referral should be considered for patients with CAC scores >=400 or >=75th percentile.  *2018 AHA/ACC/AACVPR/AAPA/ABC/ACPM/ADA/AGS/APhA/ASPC/NLA/PCNA Guideline on the Management of Blood Cholesterol: A Report  of the American College of Cardiology/American Heart Association Task Force on Clinical Practice Guidelines. J Am Coll Cardiol. 2019;73(24):3168-3209.  Epifanio Lesches, MD   Electronically Signed By: Epifanio Lesches M.D. On: 04/03/2022 16:48  Narrative EXAM: OVER-READ INTERPRETATION  CT CHEST  The following report is a limited chest CT over-read performed by radiologist Dr. Trudie Reed of [facility] on 04/01/2022. The coronary calcium score interpretation by the cardiologist is attached.  COMPARISON:  None Available.  FINDINGS: Atherosclerotic calcifications in the thoracic aorta. Small hiatal hernia. Calcifications calcified granulomas throughout the spleen and liver. Calcified granulomas in the right lower lobe with extensive calcified right hilar and subcarinal lymph nodes. Within the visualized portions of the thorax there are no suspicious appearing pulmonary nodules or masses, there is no acute consolidative airspace disease, no pleural effusions, no pneumothorax and no lymphadenopathy. Visualized portions of the upper abdomen are unremarkable. There are no aggressive appearing lytic or blastic lesions noted in the visualized portions of the skeleton.  IMPRESSION: 1. Old granulomatous disease, as above. 2.  Aortic Atherosclerosis (ICD10-I70.0).  Electronically Signed: By: Trudie Reed M.D. On: 04/02/2022 07:10          Risk Assessment/Calculations:    CHA2DS2-VASc Score = 4   This indicates a 4.8% annual risk of stroke. The patient's score is based upon: CHF History: 0 HTN History: 1 Diabetes History: 0 Stroke History: 0 Vascular Disease History: 0 Age Score: 2 Gender Score: 1            Physical Exam:   VS:  BP 112/74   Pulse (!) 55   Ht 5' 1.75" (1.568 m)   Wt 191 lb 12.8 oz (87 kg)   LMP 10/03/1998   SpO2 98%   BMI 35.37 kg/m    Wt Readings from Last 3 Encounters:  07/28/23 191 lb 12.8 oz (87 kg)   07/24/23 188 lb (85.3 kg)  06/26/23 187 lb (84.8 kg)    GEN: Well nourished, overweight, well developed in no acute distress NECK: No JVD; No carotid bruits CARDIAC: RRR, no murmurs, rubs, gallops RESPIRATORY:  Clear to auscultation without rales, wheezing or rhonchi  ABDOMEN: Soft, non-tender, non-distended EXTREMITIES:  No edema; No deformity   ASSESSMENT AND PLAN: .    Exertional dyspnea - 03/2022 coronary calcium score of 0. Echo 04/2022 normal LVEF, mild MR. Has upcoming appt to establish with pulmonology.    PAF /hypercoagulable state - Monitor 11/2022 with 21% burden which is overall rate controlled.  Continue Eliquis 5 mg twice daily,  Bystolic 20mg  every day. She cannot necessarily distinguish when she is atrial fib vs NSR.   Does not meet dose reduction criteria for Eliquis.  Denies bleeding complications. CHA2DS2-VASc Score = 4 [CHF History: 0, HTN History: 1, Diabetes History: 0, Stroke History: 0, Vascular Disease History: 0, Age Score: 2, Gender Score: 1].  Therefore, the patient's annual risk of stroke is  4.8 %.   Update CBC, BMP today for monitoring on Eliquis.   HTN - BP well controlled. Continue current antihypertensive regimen.   Discussed to monitor BP at home at least 2 hours after medications and sitting for 5-10 minutes.    OSA - Has not tolerated CPAP previously.   HLD -continue simvastatin 40 mg daily.  10/2022 LDL at goal of less than 100.        Dispo: follow up in 6 months   Signed, Alver Sorrow, NP

## 2023-08-02 ENCOUNTER — Encounter: Payer: Self-pay | Admitting: Pulmonary Disease

## 2023-08-02 ENCOUNTER — Ambulatory Visit: Payer: Medicare PPO | Admitting: Pulmonary Disease

## 2023-08-02 VITALS — BP 138/74 | HR 54 | Ht 61.5 in | Wt 190.6 lb

## 2023-08-02 DIAGNOSIS — G4733 Obstructive sleep apnea (adult) (pediatric): Secondary | ICD-10-CM | POA: Diagnosis not present

## 2023-08-02 DIAGNOSIS — R0602 Shortness of breath: Secondary | ICD-10-CM | POA: Diagnosis not present

## 2023-08-02 MED ORDER — ALBUTEROL SULFATE HFA 108 (90 BASE) MCG/ACT IN AERS: 2.0000 | INHALATION_SPRAY | Freq: Four times a day (QID) | RESPIRATORY_TRACT | 6 refills | Status: AC | PRN

## 2023-08-02 NOTE — Progress Notes (Signed)
Felicia Franklin    [id number]    06-12-1946  Primary Care Physician:Aronson, Gerlene Burdock, MD  Referring Physician: Geoffry Paradise, MD [address] [place],  [place] [address]  Chief complaint:   Patient being seen for shortness of breath  HPI:  Shortness of breath with activity  Minimal activity has been making her short of breath recently Less than a block activity tolerance  She does have an occasional productive cough  History of hypertension, cardiac rhythm problems, hypercholesterolemia, atrial fibrillation  Past history of obstructive sleep apnea -Moderate obstructive sleep apnea -Use CPAP for few years   Never smoker, was exposed to secondhand smoke  Retired Comptroller No pertinent family history  Outpatient Encounter Medications as of 08/02/2023  Medication Sig   aliskiren (TEKTURNA) 300 MG tablet Take 300 mg by mouth daily.   amLODipine (NORVASC) 10 MG tablet Take 10 mg by mouth daily.   ascorbic acid (VITAMIN C) 500 MG tablet Take 500 mg by mouth daily.   buPROPion (WELLBUTRIN SR) 150 MG 12 hr tablet Take 1 tablet (150 mg total) by mouth 2 (two) times daily.   Collagen-Boron-Hyaluronic Acid (MOVE FREE ULTRA JOINT HEALTH PO) Take 1 tablet by mouth daily.   Docusate Sodium (COLACE PO) Take by mouth.   ELIQUIS 5 MG TABS tablet TAKE 1 TABLET(5 MG) BY MOUTH TWICE DAILY   Multiple Vitamins-Minerals (CENTRUM SILVER PO) Take 1 tablet by mouth daily.   Nebivolol HCl (BYSTOLIC) 20 MG TABS Take 1 tablet (20 mg total) by mouth daily.   Polyethylene Glycol 3350 (MIRALAX PO) Take by mouth daily.    simvastatin (ZOCOR) 40 MG tablet Take 40 mg by mouth every evening.   spironolactone (ALDACTONE) 25 MG tablet Take 1 tablet (25 mg total) by mouth every morning.   topiramate (TOPAMAX) 100 MG tablet Take 1 tablet (100 mg total) by mouth at bedtime.   No facility-administered encounter medications on file as of 08/02/2023.    Allergies as of 08/02/2023 - Review Complete  08/02/2023  Allergen Reaction Noted   Triple antibiotic [bacitracin-neomycin-polymyxin] Rash 10/17/2013    Past Medical History:  Diagnosis Date   Anxiety    Anxiety    Arthritis    Back pain    Constipation    Essential hypertension    Hyperlipidemia    Joint pain    Lightheadedness 03/17/2022   Lower back pain    Osteoarthritis    Other fatigue    Paroxysmal atrial fibrillation (HCC)    PONV (postoperative nausea and vomiting)    severe-also can get agitated waking up   Shortness of breath on exertion    Sleep apnea    Wears glasses     Past Surgical History:  Procedure Laterality Date   BUNIONECTOMY     CARPAL TUNNEL RELEASE Left 2007   CARPAL TUNNEL RELEASE Right 1992   CARPOMETACARPAL (CMC) FUSION OF THUMB Right 11/26/2013   Procedure: CARPOMETACARPAL (CMC) FUSION OF THUMB;  Surgeon: Wyn Forster., MD;  Location: [facility];  Service: Orthopedics;  Laterality: Right;   COLONOSCOPY     FOOT SURGERY Left 2005   bunionectomy-osteotomy   HAMMER TOE SURGERY      Family History  Problem Relation Age of Onset   Leukemia Father    Colon cancer Father    Diabetes Father    Prostate cancer Father    Hypertension Father    Heart disease Father    Obesity Father  Breast cancer Mother 75   Hypertension Mother    Anxiety disorder Mother    Cancer Mother    Hyperlipidemia Other    COPD Other    Cancer Other    Obesity Other     Social History   Socioeconomic History   Marital status: Divorced    Spouse name: Not on file   Number of children: 1   Years of education: Not on file   Highest education level: Not on file  Occupational History   Occupation: retired  Tobacco Use   Smoking status: Never   Smokeless tobacco: Never  Vaping Use   Vaping status: Never Used  Substance and Sexual Activity   Alcohol use: No    Alcohol/week: 0.0 standard drinks of alcohol   Drug use: No   Sexual activity: Not Currently    Partners: Male     Birth control/protection: Post-menopausal  Other Topics Concern   Not on file  Social History Narrative   Not on file   Social Determinants of Health   Financial Resource Strain: Low Risk  (03/17/2022)   Overall Financial Resource Strain (CARDIA)    Difficulty of Paying Living Expenses: Not hard at all  Food Insecurity: No Food Insecurity (03/17/2022)   Hunger Vital Sign    Worried About Running Out of Food in the Last Year: Never true    Ran Out of Food in the Last Year: Never true  Transportation Needs: No Transportation Needs (03/17/2022)   PRAPARE - Administrator, Civil Service (Medical): No    Lack of Transportation (Non-Medical): No  Physical Activity: Sufficiently Active (03/17/2022)   Exercise Vital Sign    Days of Exercise per Week: 5 days    Minutes of Exercise per Session: 120 min  Stress: Not on file  Social Connections: Not on file  Intimate Partner Violence: Not on file    Review of Systems  Respiratory:  Positive for cough and shortness of breath.     Vitals:   08/02/23 1407  BP: 138/74  Pulse: (!) 54  SpO2: 97%     Physical Exam Constitutional:      Appearance: She is obese.  HENT:     Head: Normocephalic.     Mouth/Throat:     Mouth: Mucous membranes are moist.     Pharynx: Oropharynx is clear.  Eyes:     General: No scleral icterus.    Pupils: Pupils are equal, round, and reactive to light.  Cardiovascular:     Rate and Rhythm: Normal rate and regular rhythm.     Heart sounds: No murmur heard.    No friction rub.  Pulmonary:     Effort: No respiratory distress.     Breath sounds: No stridor. No wheezing or rhonchi.  Musculoskeletal:     Cervical back: No rigidity or tenderness.  Neurological:     Mental Status: She is alert.  Psychiatric:        Mood and Affect: Mood normal.    Data Reviewed: Echocardiogram July 2024 reviewed showing diastolic dysfunction  Previous sleep study showing moderate obstructive sleep  apnea  Assessment:  Untreated obstructive sleep apnea -Unsure whether she will be able to tolerate CPAP therapy but willing to reconsider treatment  Shortness of breath on exertion -Likely multifactorial -Diastolic dysfunction on echo  Deconditioning may be playing a role  Class II obesity  Plan/Recommendations: Schedule pulmonary function test  Schedule for home sleep test  Albuterol for shortness of breath  and for chest tightness  Encourage graded activities as tolerated  Follow-up in 8 to 12 weeks   Virl Diamond MD [facility] 08/02/2023, 2:11 PM  CC: Geoffry Paradise, MD

## 2023-08-02 NOTE — Patient Instructions (Addendum)
Schedule for pulmonary function test  Schedule for home sleep test  Albuterol for shortness of breath/chest tightness  Graded activities as tolerated  Follow-up in about 8 to 12 weeks

## 2023-08-15 DIAGNOSIS — M059 Rheumatoid arthritis with rheumatoid factor, unspecified: Secondary | ICD-10-CM | POA: Diagnosis not present

## 2023-08-15 DIAGNOSIS — Z6835 Body mass index (BMI) 35.0-35.9, adult: Secondary | ICD-10-CM | POA: Diagnosis not present

## 2023-08-15 DIAGNOSIS — E669 Obesity, unspecified: Secondary | ICD-10-CM | POA: Diagnosis not present

## 2023-08-15 DIAGNOSIS — M1991 Primary osteoarthritis, unspecified site: Secondary | ICD-10-CM | POA: Diagnosis not present

## 2023-08-15 DIAGNOSIS — M79642 Pain in left hand: Secondary | ICD-10-CM | POA: Diagnosis not present

## 2023-08-15 DIAGNOSIS — M79641 Pain in right hand: Secondary | ICD-10-CM | POA: Diagnosis not present

## 2023-08-16 ENCOUNTER — Other Ambulatory Visit (HOSPITAL_BASED_OUTPATIENT_CLINIC_OR_DEPARTMENT_OTHER): Payer: Self-pay | Admitting: Family

## 2023-08-22 ENCOUNTER — Encounter (INDEPENDENT_AMBULATORY_CARE_PROVIDER_SITE_OTHER): Payer: Self-pay | Admitting: Family Medicine

## 2023-08-22 ENCOUNTER — Other Ambulatory Visit (INDEPENDENT_AMBULATORY_CARE_PROVIDER_SITE_OTHER): Payer: Self-pay | Admitting: Family Medicine

## 2023-08-22 ENCOUNTER — Ambulatory Visit (INDEPENDENT_AMBULATORY_CARE_PROVIDER_SITE_OTHER): Payer: Medicare PPO | Admitting: Family Medicine

## 2023-08-22 VITALS — BP 120/56 | HR 72 | Temp 98.5°F | Ht 62.0 in | Wt 186.0 lb

## 2023-08-22 DIAGNOSIS — M069 Rheumatoid arthritis, unspecified: Secondary | ICD-10-CM

## 2023-08-22 DIAGNOSIS — F5089 Other specified eating disorder: Secondary | ICD-10-CM | POA: Diagnosis not present

## 2023-08-22 DIAGNOSIS — I10 Essential (primary) hypertension: Secondary | ICD-10-CM | POA: Diagnosis not present

## 2023-08-22 DIAGNOSIS — Z6834 Body mass index (BMI) 34.0-34.9, adult: Secondary | ICD-10-CM

## 2023-08-22 DIAGNOSIS — E669 Obesity, unspecified: Secondary | ICD-10-CM | POA: Diagnosis not present

## 2023-08-22 DIAGNOSIS — F3289 Other specified depressive episodes: Secondary | ICD-10-CM

## 2023-08-22 NOTE — Progress Notes (Signed)
.smr  Office: [phone]  /  Fax: [phone]  WEIGHT SUMMARY AND BIOMETRICS  Anthropometric Measurements Height: 5\' 2"  (1.575 m) Weight: 186 lb (84.4 kg) BMI (Calculated): 34.01 Weight at Last Visit: 188 lb Weight Lost Since Last Visit: 2 lb Weight Gained Since Last Visit: 0 Starting Weight: 198 lb Total Weight Loss (lbs): 12 lb (5.443 kg)   Body Composition  Body Fat %: 48.3 % Fat Mass (lbs): 90.2 lbs Muscle Mass (lbs): 91.4 lbs Total Body Water (lbs): 69.2 lbs Visceral Fat Rating : 15   Other Clinical Data Fasting: No Labs: No Today's Visit #: 41 Starting Date: 10/08/20    Chief Complaint: OBESITY    History of Present Illness   The patient presents today for a follow-up visit regarding her hypertension, emotional eating behaviors, and obesity. Her blood pressure is well controlled on a regimen of amlodipine, nebivolol, and spironolactone. She is also on Wellbutrin and topiramate for emotional eating behaviors and has requested a refill for these medications. She has lost two pounds in the last month and attributes this to portion control and smart food choices rather than a structured eating plan. She continues to engage in water exercises five times per week for over an hour.  The patient recently experienced a stressful event involving her pet dog's sudden illness, which led to increased comfort eating. She has since tried to compensate for this. She also had a follow-up visit with her rheumatologist, who provided a list of five potential medications for her to research. All these medications have significant side effects, and the patient has expressed anxiety about starting a new medication due to a previous adverse reaction to Plaquenil. She has decided to start on Leflunomide, an older medication, and will begin this regimen tomorrow.  The patient is also dealing with the stress of upcoming holiday plans, which may impact her eating behaviors. She has expressed a  desire to continue with regular appointments for accountability and support.          PHYSICAL EXAM:  Blood pressure (!) 120/56, pulse 72, temperature 98.5 F (36.9 C), height 5\' 2"  (1.575 m), weight 186 lb (84.4 kg), last menstrual period 10/03/1998, SpO2 99%. Body mass index is 34.02 kg/m.  DIAGNOSTIC DATA REVIEWED:  BMET    Component Value Date/Time   NA 137 07/28/2023 1414   K 4.4 07/28/2023 1414   CL 101 07/28/2023 1414   CO2 21 07/28/2023 1414   GLUCOSE 97 07/28/2023 1414   GLUCOSE 91 11/21/2013 1310   BUN 24 07/28/2023 1414   CREATININE 1.01 (H) 07/28/2023 1414   CALCIUM 10.0 07/28/2023 1414   GFRNONAA 71 10/08/2020 0923   GFRAA 82 10/08/2020 0923   Lab Results  Component Value Date   HGBA1C 5.2 04/18/2022   HGBA1C 5.6 10/08/2020   Lab Results  Component Value Date   INSULIN 10.5 04/18/2022   INSULIN 23.3 10/08/2020   Lab Results  Component Value Date   TSH 2.190 04/18/2022   CBC    Component Value Date/Time   WBC 6.0 07/28/2023 1414   RBC 4.43 07/28/2023 1414   HGB 13.3 07/28/2023 1414   HCT 41.1 07/28/2023 1414   PLT 268 07/28/2023 1414   MCV 93 07/28/2023 1414   MCH 30.0 07/28/2023 1414   MCHC 32.4 07/28/2023 1414   RDW 13.3 07/28/2023 1414   Iron Studies No results found for: "IRON", "TIBC", "FERRITIN", "IRONPCTSAT" Lipid Panel     Component Value Date/Time   CHOL 166 04/18/2022 0846  TRIG 93 04/18/2022 0846   HDL 61 04/18/2022 0846   LDLCALC 88 04/18/2022 0846   Hepatic Function Panel     Component Value Date/Time   PROT 7.2 04/18/2022 0846   ALBUMIN 4.3 04/18/2022 0846   AST 20 04/18/2022 0846   ALT 17 04/18/2022 0846   ALKPHOS 109 04/18/2022 0846   BILITOT 0.7 04/18/2022 0846      Component Value Date/Time   TSH 2.190 04/18/2022 0846   Nutritional Lab Results  Component Value Date   VD25OH 66.9 04/18/2022   VD25OH 52.1 07/12/2021   VD25OH 66.0 10/08/2020     Assessment and Plan    Rheumatoid  Arthritis Prescribed leflunomide after discussing options with rheumatologist. Chose leflunomide due to side effect profile and administration method. No flare-ups since April. - Start leflunomide. Monitor for side effects and interactions with simvastatin. Ensure Plaquenil is listed as an allergy.  Hypertension Hypertension is well controlled with current medications (amlodipine, nebivolol, and spironolactone). Blood pressure today is 120/56 mmHg. - Continue current medications: amlodipine, nebivolol, and spironolactone. -Continue to work on weight loss to improve BP  Emotional Eating On Wellbutrin and topiramate for emotional eating behaviors. Requests refill for these medications. Experienced stress due to dog's illness, leading to some comfort eating. - Refill Wellbutrin and topiramate.  Obesity Has lost 2 pounds in the last month. Practicing portion control and making smart food choices. Continues water exercises five times per week for over an hour. - Continue current dietary practices and exercise regimen.  General Health Maintenance Maintaining a healthy lifestyle with regular exercise and portion control. - Continue regular follow-ups to monitor blood pressure and overall health.  Follow-up - Schedule follow-up appointments in December and January.        She was informed of the importance of frequent follow up visits to maximize her success with intensive lifestyle modifications for her multiple health conditions.    Quillian Quince, MD

## 2023-09-19 DIAGNOSIS — G4733 Obstructive sleep apnea (adult) (pediatric): Secondary | ICD-10-CM

## 2023-09-19 DIAGNOSIS — Z79899 Other long term (current) drug therapy: Secondary | ICD-10-CM | POA: Diagnosis not present

## 2023-09-19 DIAGNOSIS — G4730 Sleep apnea, unspecified: Secondary | ICD-10-CM | POA: Diagnosis not present

## 2023-09-19 DIAGNOSIS — M059 Rheumatoid arthritis with rheumatoid factor, unspecified: Secondary | ICD-10-CM | POA: Diagnosis not present

## 2023-09-21 ENCOUNTER — Ambulatory Visit (INDEPENDENT_AMBULATORY_CARE_PROVIDER_SITE_OTHER): Payer: Medicare PPO | Admitting: Adult Health

## 2023-09-21 ENCOUNTER — Encounter (INDEPENDENT_AMBULATORY_CARE_PROVIDER_SITE_OTHER): Payer: Self-pay | Admitting: Adult Health

## 2023-09-21 VITALS — BP 128/74 | HR 51 | Temp 97.4°F | Ht 62.0 in | Wt 185.0 lb

## 2023-09-21 DIAGNOSIS — R7303 Prediabetes: Secondary | ICD-10-CM | POA: Diagnosis not present

## 2023-09-21 DIAGNOSIS — Z6833 Body mass index (BMI) 33.0-33.9, adult: Secondary | ICD-10-CM | POA: Diagnosis not present

## 2023-09-21 DIAGNOSIS — E669 Obesity, unspecified: Secondary | ICD-10-CM | POA: Diagnosis not present

## 2023-09-21 DIAGNOSIS — F3289 Other specified depressive episodes: Secondary | ICD-10-CM

## 2023-09-21 DIAGNOSIS — M069 Rheumatoid arthritis, unspecified: Secondary | ICD-10-CM

## 2023-09-21 DIAGNOSIS — F5089 Other specified eating disorder: Secondary | ICD-10-CM

## 2023-09-21 NOTE — Progress Notes (Signed)
WEIGHT SUMMARY AND BIOMETRICS  Vitals Temp: (!) 97.4 F (36.3 C) BP: 128/74 Pulse Rate: (!) 51 SpO2: 97 %   Anthropometric Measurements Height: 5\' 2"  (1.575 m) Weight: 185 lb (83.9 kg) BMI (Calculated): 33.83 Weight at Last Visit: 186lb Weight Lost Since Last Visit: 1lb Weight Gained Since Last Visit: 0 Starting Weight: 198lb Total Weight Loss (lbs): 13 lb (5.897 kg)   Body Composition  Body Fat %: 48.2 % Fat Mass (lbs): 89.2 lbs Muscle Mass (lbs): 91 lbs Total Body Water (lbs): 67.8 lbs Visceral Fat Rating : 15   Other Clinical Data Fasting: no Labs: no Today's Visit #: 42 Starting Date: 10/08/20    Chief Complaint:   OBESITY Felicia Franklin is here to discuss her progress with her obesity treatment plan. She is on the the Category 2 Plan and states she is following her eating plan approximately 20 % of the time.  She states she is exercising Water Aerobics 60+ minutes 5 times per week.   Interim History:  Felicia Franklin has been loosely following prescribed Cat 2 Meal Plan. She reports increased baking for holiday season.  She has continued to exercise very regularly- 2 aquatic classes every morning Monday- Friday at [facility]  She lives alone with her dog.  Subjective:   1. Rheumatoid arthritis, involving unspecified site, unspecified whether rheumatoid factor present (HCC) Followed by Rheumatology/Dr. Andres Ege to tolerate Hydroxychloroquin, re: diffuse rash over truck- posterior/anterior  She is currently on leflunomide (ARAVA) 20 MG tablet daily- been tolerating well the last month. She reports chronic back pain as the most bothersome source of discomfort at present.  2. Prediabetes She has never been diabetic - records review did not demonstrate either A1c or CBG meeting the diabetic criteria. She was previously on Ozempic (2022) and Mounjaro (2023) She stopped Metformin therapy Spring 2024 due to GI upset She is not currently on any  antidiabetic medications  3. Emotional Eating Behavior She is prescribed Wellbutrin SR 150mg  BID and nightly Topamax 100mg . She is able to to consistently take morning Wellbutrin SR 150 mg and nightly Topamax100mg . Afternoon dose of Wellbutrin SR 150mg  she rarely takes due to difficulty remembering second dose.  Assessment/Plan:   1. Rheumatoid arthritis, involving unspecified site, unspecified whether rheumatoid factor present (HCC) Continue leflunomide (ARAVA) 20 MG tablet daily and regular f/u with Dr. Nickola Major  2. Prediabetes Increase Cat 2 Plan compliance to at least 80%  3. Emotional Eating Behavior Set alarm for afternoon Wellbutrin XR 150mg  dose Continue Wellbutrin SR 150mg  BID and nightly Topamax 100mg    4. BMI 33.0-33.9,adult, Current BMI 33.83  Felicia Franklin is currently in the action stage of change. As such, her goal is to get back to weightloss efforts . She has agreed to the Category 2 Plan.   Exercise goals: Older adults should follow the adult guidelines. When older adults cannot meet the adult guidelines, they should be as physically active as their abilities and conditions will allow.  Older adults should do exercises that maintain or improve balance if they are at risk of falling.  Older adults should determine their level of effort for physical activity relative to their level of fitness.  Older adults with chronic conditions should understand whether and how their conditions affect their ability to do regular physical activity safely.  Behavioral modification strategies: increasing lean protein intake, decreasing simple carbohydrates, increasing vegetables, increasing water intake, keeping healthy foods in the home, ways to avoid boredom eating, ways to avoid night time  snacking, and planning for success.  Felicia Franklin has agreed to follow-up with our clinic in 4 weeks. She was informed of the importance of frequent follow-up visits to maximize her success with intensive lifestyle  modifications for her multiple health conditions.   Objective:   Blood pressure 128/74, pulse (!) 51, temperature (!) 97.4 F (36.3 C), height 5\' 2"  (1.575 m), weight 185 lb (83.9 kg), last menstrual period 10/03/1998, SpO2 97%. Body mass index is 33.84 kg/m.  General: Cooperative, alert, well developed, in no acute distress. HEENT: Conjunctivae and lids unremarkable. Cardiovascular: Regular rhythm.  Lungs: Normal work of breathing. Neurologic: No focal deficits.   Lab Results  Component Value Date   CREATININE 1.01 (H) 07/28/2023   BUN 24 07/28/2023   NA 137 07/28/2023   K 4.4 07/28/2023   CL 101 07/28/2023   CO2 21 07/28/2023   Lab Results  Component Value Date   ALT 17 04/18/2022   AST 20 04/18/2022   ALKPHOS 109 04/18/2022   BILITOT 0.7 04/18/2022   Lab Results  Component Value Date   HGBA1C 5.2 04/18/2022   HGBA1C 5.5 07/12/2021   HGBA1C 5.6 10/08/2020   Lab Results  Component Value Date   INSULIN 10.5 04/18/2022   INSULIN 22.3 07/12/2021   INSULIN 23.3 10/08/2020   Lab Results  Component Value Date   TSH 2.190 04/18/2022   Lab Results  Component Value Date   CHOL 166 04/18/2022   HDL 61 04/18/2022   LDLCALC 88 04/18/2022   TRIG 93 04/18/2022   Lab Results  Component Value Date   VD25OH 66.9 04/18/2022   VD25OH 52.1 07/12/2021   VD25OH 66.0 10/08/2020   Lab Results  Component Value Date   WBC 6.0 07/28/2023   HGB 13.3 07/28/2023   HCT 41.1 07/28/2023   MCV 93 07/28/2023   PLT 268 07/28/2023   No results found for: "IRON", "TIBC", "FERRITIN"  Attestation Statements:   Reviewed by clinician on day of visit: allergies, medications, problem list, medical history, surgical history, family history, social history, and previous encounter notes.  Time spent on visit including pre-visit chart review and post-visit care and charting was 28 minutes.   I have reviewed the above documentation for accuracy and completeness, and I agree with the  above. -  Anders Hohmann d. Felicia Flener, NP-C

## 2023-10-08 ENCOUNTER — Telehealth: Payer: Self-pay | Admitting: Pulmonary Disease

## 2023-10-08 NOTE — Telephone Encounter (Signed)
 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

## 2023-10-09 NOTE — Telephone Encounter (Signed)
 Atc pt no answer, lvm for pt to give the office a call back.

## 2023-10-10 ENCOUNTER — Other Ambulatory Visit: Payer: Self-pay | Admitting: Cardiovascular Disease

## 2023-10-10 DIAGNOSIS — I480 Paroxysmal atrial fibrillation: Secondary | ICD-10-CM

## 2023-10-10 NOTE — Telephone Encounter (Signed)
 Prescription refill request for Eliquis received. Indication:afib Last office visit:10/24 Scr:1.01  10/24 Age: 78 Weight:83.9  kg  Prescription refilled

## 2023-10-12 NOTE — Telephone Encounter (Signed)
 PT ret call. Please call again.

## 2023-10-13 NOTE — Telephone Encounter (Signed)
 Atc pt no answer, lvmm for pt to give the office a call back

## 2023-10-16 ENCOUNTER — Telehealth: Payer: Self-pay | Admitting: Pulmonary Disease

## 2023-10-16 DIAGNOSIS — G4733 Obstructive sleep apnea (adult) (pediatric): Secondary | ICD-10-CM

## 2023-10-16 NOTE — Telephone Encounter (Signed)
 Patient is returning missed call in regards to sleep study results. Call back number [phone]

## 2023-10-16 NOTE — Telephone Encounter (Signed)
 Attempted to call pt but unable to reach. Left message to return call.   Due to multiple attempts trying to call pt without being able to reach, per protocol encounter will be closed.

## 2023-10-16 NOTE — Telephone Encounter (Signed)
 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 Pt was a little hesitant on beginning therapy due to being on it years ago which she stated interfered with her trying to sleep. Pt said she would give cpap another try to see how things would do with her sleeping. Stated to pt to call the office once she received machine/equipment so we could schedule f/u after receiving machine and she verbalized understanding.

## 2023-10-24 DIAGNOSIS — M79641 Pain in right hand: Secondary | ICD-10-CM | POA: Diagnosis not present

## 2023-10-24 DIAGNOSIS — E669 Obesity, unspecified: Secondary | ICD-10-CM | POA: Diagnosis not present

## 2023-10-24 DIAGNOSIS — Z79899 Other long term (current) drug therapy: Secondary | ICD-10-CM | POA: Diagnosis not present

## 2023-10-24 DIAGNOSIS — M059 Rheumatoid arthritis with rheumatoid factor, unspecified: Secondary | ICD-10-CM | POA: Diagnosis not present

## 2023-10-24 DIAGNOSIS — Z6836 Body mass index (BMI) 36.0-36.9, adult: Secondary | ICD-10-CM | POA: Diagnosis not present

## 2023-10-24 DIAGNOSIS — M1991 Primary osteoarthritis, unspecified site: Secondary | ICD-10-CM | POA: Diagnosis not present

## 2023-10-26 DIAGNOSIS — Z1212 Encounter for screening for malignant neoplasm of rectum: Secondary | ICD-10-CM | POA: Diagnosis not present

## 2023-10-26 DIAGNOSIS — I10 Essential (primary) hypertension: Secondary | ICD-10-CM | POA: Diagnosis not present

## 2023-10-26 DIAGNOSIS — E785 Hyperlipidemia, unspecified: Secondary | ICD-10-CM | POA: Diagnosis not present

## 2023-10-30 ENCOUNTER — Telehealth: Payer: Self-pay

## 2023-10-30 DIAGNOSIS — Z1331 Encounter for screening for depression: Secondary | ICD-10-CM | POA: Diagnosis not present

## 2023-10-30 DIAGNOSIS — M1990 Unspecified osteoarthritis, unspecified site: Secondary | ICD-10-CM | POA: Diagnosis not present

## 2023-10-30 DIAGNOSIS — I10 Essential (primary) hypertension: Secondary | ICD-10-CM | POA: Diagnosis not present

## 2023-10-30 DIAGNOSIS — E785 Hyperlipidemia, unspecified: Secondary | ICD-10-CM | POA: Diagnosis not present

## 2023-10-30 DIAGNOSIS — R82998 Other abnormal findings in urine: Secondary | ICD-10-CM | POA: Diagnosis not present

## 2023-10-30 DIAGNOSIS — Z0000 Encounter for general adult medical examination without abnormal findings: Secondary | ICD-10-CM | POA: Diagnosis not present

## 2023-10-30 DIAGNOSIS — Z1339 Encounter for screening examination for other mental health and behavioral disorders: Secondary | ICD-10-CM | POA: Diagnosis not present

## 2023-10-30 DIAGNOSIS — E669 Obesity, unspecified: Secondary | ICD-10-CM | POA: Diagnosis not present

## 2023-10-30 DIAGNOSIS — I480 Paroxysmal atrial fibrillation: Secondary | ICD-10-CM | POA: Diagnosis not present

## 2023-10-30 DIAGNOSIS — I872 Venous insufficiency (chronic) (peripheral): Secondary | ICD-10-CM | POA: Diagnosis not present

## 2023-10-30 NOTE — Telephone Encounter (Signed)
ATC pt X1 Lvm asking for call back. Pt is scheduled to see Waynetta Sandy, NP tomorrow and I was unable to print a DL for the pt and a DME company is not listed.

## 2023-10-31 ENCOUNTER — Ambulatory Visit: Payer: Medicare PPO | Admitting: Pulmonary Disease

## 2023-10-31 ENCOUNTER — Ambulatory Visit: Payer: Medicare PPO | Admitting: Primary Care

## 2023-10-31 VITALS — BP 127/76 | HR 70 | Temp 97.6°F | Ht 62.0 in | Wt 192.0 lb

## 2023-10-31 DIAGNOSIS — R0602 Shortness of breath: Secondary | ICD-10-CM | POA: Diagnosis not present

## 2023-10-31 DIAGNOSIS — G4733 Obstructive sleep apnea (adult) (pediatric): Secondary | ICD-10-CM

## 2023-10-31 DIAGNOSIS — R0600 Dyspnea, unspecified: Secondary | ICD-10-CM | POA: Diagnosis not present

## 2023-10-31 LAB — PULMONARY FUNCTION TEST
DL/VA % pred: 137 %
DL/VA: 5.74 ml/min/mmHg/L
DLCO cor % pred: 102 %
DLCO cor: 18.3 ml/min/mmHg
DLCO unc % pred: 102 %
DLCO unc: 18.3 ml/min/mmHg
FEF 25-75 Post: 2.25 L/s
FEF 25-75 Pre: 1.91 L/s
FEF2575-%Change-Post: 17 %
FEF2575-%Pred-Post: 150 %
FEF2575-%Pred-Pre: 127 %
FEV1-%Change-Post: 2 %
FEV1-%Pred-Post: 87 %
FEV1-%Pred-Pre: 85 %
FEV1-Post: 1.66 L
FEV1-Pre: 1.62 L
FEV1FVC-%Change-Post: 3 %
FEV1FVC-%Pred-Pre: 112 %
FEV6-%Change-Post: 0 %
FEV6-%Pred-Post: 79 %
FEV6-%Pred-Pre: 80 %
FEV6-Post: 1.91 L
FEV6-Pre: 1.93 L
FEV6FVC-%Pred-Post: 105 %
FEV6FVC-%Pred-Pre: 105 %
FVC-%Change-Post: 0 %
FVC-%Pred-Post: 75 %
FVC-%Pred-Pre: 76 %
FVC-Post: 1.91 L
FVC-Pre: 1.93 L
Post FEV1/FVC ratio: 87 %
Post FEV6/FVC ratio: 100 %
Pre FEV1/FVC ratio: 84 %
Pre FEV6/FVC Ratio: 100 %
RV % pred: 87 %
RV: 1.92 L
TLC % pred: 80 %
TLC: 3.84 L

## 2023-10-31 NOTE — Patient Instructions (Addendum)
-OBSTRUCTIVE SLEEP APNEA: Obstructive sleep apnea is a condition where your breathing repeatedly stops and starts during sleep due to blocked airways. We will restart CPAP therapy, which helps keep your airways open. Please use the CPAP machine for at least 4 hours each night and during naps if needed. We will have a follow-up appointment next week and another in 6-8 weeks to check on your progress.  -SHORTNESS OF BREATH: Your shortness of breath does not appear to be caused by lung or heart issues based on recent tests. Continue your water aerobics five days a week, weight loss efforts and monitor your symptoms  -MITRAL REGURGITATION: Mitral regurgitation is a condition where the heart's mitral valve doesn't close tightly, allowing blood to flow backward in the heart. Your case is mild and not currently causing symptoms. Continue managing your blood pressure to prevent it from worsening.  Follow-up: Follow-up with Dr. Wynona Neat or [facility] NP in 6-8 weeks to evaluate your CPAP compliance and effectiveness.   Sleep Apnea  Sleep apnea is a condition that affects your breathing while you are sleeping. Your tongue or soft tissue in your throat may block the flow of air while you sleep. You may have shallow breathing or stop breathing for short periods of time. People with sleep apnea may snore loudly. There are three kinds of sleep apnea: Obstructive sleep apnea. This kind is caused by a blocked or collapsed airway. This is the most common. Central sleep apnea. This kind happens when the part of the brain that controls breathing does not send the correct signals to the muscles that control breathing. Mixed sleep apnea. This is a combination of obstructive and central sleep apnea. What are the causes? The most common cause of sleep apnea is a collapsed or blocked airway. What increases the risk? Being very overweight. Having family members with sleep apnea. Having a tongue or tonsils that are larger  than normal. Having a small airway or jaw problems. Being older. What are the signs or symptoms? Loud snoring. Restless sleep. Trouble staying asleep. Being sleepy or tired during the day. Waking up gasping or choking. Having a headache in the morning. Mood swings. Having a hard time remembering things and concentrating. How is this diagnosed? A medical history. A physical exam. A sleep study. This is also called a polysomnography test. This test is done at a sleep lab or in your home while you are sleeping. How is this treated? Treatment may include: Sleeping on your side. Losing weight if you're overweight. Wearing an oral appliance. This is a mouthpiece that moves your lower jaw forward. Using a positive airway pressure (PAP) device to keep your airways open while you sleep, such as: A continuous positive airway pressure (CPAP) device. This device gives forced air through a mask when you breathe out. This keeps your airways open. A bilevel positive airway pressure (BIPAP) device. This device gives forced air through a mask when you breathe in and when you breathe out to keep your airways open. Having surgery if other treatments do not work. If your sleep apnea is not treated, you may be at risk for: Heart failure. Heart attack. Stroke. Type 2 diabetes or a problem with your blood sugar called insulin resistance. Follow these instructions at home: Medicines Take your medicines only as told by your health care provider. Avoid alcohol, medicines to help you relax, and certain pain medicines. These may make sleep apnea worse. General instructions Do not smoke, vape, or use products with nicotine  or tobacco in them. If you need help quitting, talk with your provider. If you were given a PAP device to open your airway while you sleep, use it as told by your provider. If you're having surgery, make sure to tell your provider you have sleep apnea. You may need to bring your PAP device  with you. Contact a health care provider if: The PAP device that you were given to use during sleep bothers you or does not seem to be working. You do not feel better or you feel worse. Get help right away if: You have trouble breathing. You have chest pain. You have trouble talking. One side of your body feels weak. A part of your face is hanging down. These symptoms may be an emergency. Call 911 right away. Do not wait to see if the symptoms will go away. Do not drive yourself to the hospital. This information is not intended to replace advice given to you by your health care provider. Make sure you discuss any questions you have with your health care provider. Document Revised: 06/22/2023 Document Reviewed: 11/24/2022 Elsevier Patient Education  2024 [employer].

## 2023-10-31 NOTE — Patient Instructions (Signed)
Full PFT performed today.

## 2023-10-31 NOTE — Progress Notes (Signed)
@Patient  ID: Felicia Franklin, female    DOB: 1946/06/11, 78 y.o.   MRN: [id number]  No chief complaint on file.   Referring provider: Geoffry Paradise, MD  HPI: 78 year old female, never smoked.  Past medical history significant for hypertension, paroxysmal A-fib, OSA, mixed dyslipidemia, prediabetes, vitamin D deficiency, obesity. Patient of Dr. Wynona Neat, last seen in office October 2024.  10/31/2023 Patient was last seen in October 2024 due to untreated obstructive sleep apnea, unsure whether patient will be able to tolerate CPAP but during last overview she was willing to reconsider treatment. She was ordered for home sleep study as well as pulmonary function testing and echocardiogram due to shortness of breath on exertion, underlying cause felt to be likely multifactorial potentially related to deconditioning  Sleep study on 09/19/2023 showed moderate obstructive sleep apnea, AHI 19.9 an hour with SpO2 low 79%.  Patient spent 12 minutes with oxygen level less than 88%.   Discussed the use of AI scribe software for clinical note transcription with the patient, who gave verbal consent to proceed.  History of Present Illness   The patient, with a history of sleep apnea, presents with sleep apnea and shortness of breath.  She has a history of sleep apnea, confirmed by a sleep study in December showing moderate sleep apnea with an average of nineteen apneic events per hour and oxygen desaturation to seventy-nine percent, spending about twelve minutes below eighty-eight percent. She previously had difficulty tolerating CPAP therapy but is open to retrying it.  She experiences exertional shortness of breath, noting that minimal activity can provoke it. Despite not engaging in strenuous activities, she can perform household tasks like making the bed without difficulty. She participates in water exercises five days a week, taking two classes every morning and sometimes attending on Sundays for  fun. No significant change in her breathing pattern since her last visit has been noted. An echocardiogram in July showed normal heart pump function with mild mitral valve regurgitation. A breathing test indicated normal lung function with no evidence of COPD, and a CT chest for cardiac calcium scoring was unremarkable for pulmonary issues.  Her mother had COPD. She is a retired Comptroller, has never smoked but was exposed to secondhand smoke. No wheezing or leg swelling.    10/31/2023 PFT>> FVC 1.91 (75%), FEV1 1.66 (87%), ratio 87, TLC 88%, DLCO 18.30 (102%) Mild restriction noted on flow-volume loop     Allergies  Allergen Reactions   Plaquenil [Hydroxychloroquine]    Triple Antibiotic [Bacitracin-Neomycin-Polymyxin] Rash    Immunization History  Administered Date(s) Administered   Influenza Split 07/08/2013   Influenza Whole 07/03/2012   PFIZER(Purple Top)SARS-COV-2 Vaccination 11/17/2019, 12/09/2019   Tdap 10/23/2014    Past Medical History:  Diagnosis Date   Anxiety    Anxiety    Arthritis    Back pain    Constipation    Essential hypertension    Hyperlipidemia    Joint pain    Lightheadedness 03/17/2022   Lower back pain    Osteoarthritis    Other fatigue    Paroxysmal atrial fibrillation (HCC)    PONV (postoperative nausea and vomiting)    severe-also can get agitated waking up   Shortness of breath on exertion    Sleep apnea    Wears glasses     Tobacco History: Social History   Tobacco Use  Smoking Status Never  Smokeless Tobacco Never   Counseling given: Not Answered   Outpatient Medications Prior to Visit  Medication  Sig Dispense Refill   albuterol (VENTOLIN HFA) 108 (90 Base) MCG/ACT inhaler Inhale 2 puffs into the lungs every 6 (six) hours as needed for wheezing or shortness of breath. 8 g 6   aliskiren (TEKTURNA) 300 MG tablet Take 300 mg by mouth daily.     amLODipine (NORVASC) 10 MG tablet Take 10 mg by mouth daily.     ascorbic acid (VITAMIN  C) 500 MG tablet Take 500 mg by mouth daily.     buPROPion (WELLBUTRIN SR) 150 MG 12 hr tablet Take 1 tablet (150 mg total) by mouth 2 (two) times daily. 180 tablet 0   Collagen-Boron-Hyaluronic Acid (MOVE FREE ULTRA JOINT HEALTH PO) Take 1 tablet by mouth daily.     Docusate Sodium (COLACE PO) Take by mouth.     ELIQUIS 5 MG TABS tablet TAKE 1 TABLET(5 MG) BY MOUTH TWICE DAILY 180 tablet 1   leflunomide (ARAVA) 20 MG tablet Take 20 mg by mouth daily.     Multiple Vitamins-Minerals (CENTRUM SILVER PO) Take 1 tablet by mouth daily.     Nebivolol HCl 20 MG TABS TAKE 1 TABLET(20 MG) BY MOUTH DAILY 90 tablet 1   Polyethylene Glycol 3350 (MIRALAX PO) Take by mouth daily.      simvastatin (ZOCOR) 40 MG tablet Take 40 mg by mouth every evening.     spironolactone (ALDACTONE) 25 MG tablet Take 1 tablet (25 mg total) by mouth every morning. 90 tablet 3   topiramate (TOPAMAX) 100 MG tablet Take 1 tablet (100 mg total) by mouth at bedtime. 30 tablet 0   No facility-administered medications prior to visit.   Review of Systems  Review of Systems  Constitutional: Negative.   HENT: Negative.    Respiratory:  Positive for shortness of breath.   Cardiovascular: Negative.    Physical Exam  LMP 10/03/1998  Physical Exam Constitutional:      General: She is not in acute distress.    Appearance: Normal appearance. She is not ill-appearing.  HENT:     Head: Normocephalic and atraumatic.  Cardiovascular:     Rate and Rhythm: Normal rate and regular rhythm.     Comments: No leg swelling Pulmonary:     Effort: Pulmonary effort is normal.     Breath sounds: Normal breath sounds. No wheezing or rhonchi.     Comments: CTA Musculoskeletal:        General: Normal range of motion.  Skin:    General: Skin is warm and dry.  Neurological:     General: No focal deficit present.     Mental Status: She is alert and oriented to person, place, and time. Mental status is at baseline.  Psychiatric:        Mood  and Affect: Mood normal.        Behavior: Behavior normal.        Thought Content: Thought content normal.        Judgment: Judgment normal.      Lab Results:  CBC    Component Value Date/Time   WBC 6.0 07/28/2023 1414   RBC 4.43 07/28/2023 1414   HGB 13.3 07/28/2023 1414   HCT 41.1 07/28/2023 1414   PLT 268 07/28/2023 1414   MCV 93 07/28/2023 1414   MCH 30.0 07/28/2023 1414   MCHC 32.4 07/28/2023 1414   RDW 13.3 07/28/2023 1414   LYMPHSABS 1.6 04/18/2022 0846   EOSABS 0.2 04/18/2022 0846   BASOSABS 0.1 04/18/2022 0846    BMET  Component Value Date/Time   NA 137 07/28/2023 1414   K 4.4 07/28/2023 1414   CL 101 07/28/2023 1414   CO2 21 07/28/2023 1414   GLUCOSE 97 07/28/2023 1414   GLUCOSE 91 11/21/2013 1310   BUN 24 07/28/2023 1414   CREATININE 1.01 (H) 07/28/2023 1414   CALCIUM 10.0 07/28/2023 1414   GFRNONAA 71 10/08/2020 0923   GFRAA 82 10/08/2020 0923    BNP    Component Value Date/Time   BNP 19.2 08/06/2021 1333    ProBNP No results found for: "PROBNP"  Imaging: No results found.   Assessment & Plan:   1. Moderate obstructive sleep apnea (Primary)      Obstructive Sleep Apnea Moderate severity with 19 apneas per hour and oxygen desaturation to 79%. Patient has previously struggled with CPAP tolerance but is willing to retry. She has an apt with DME for CPAP set up -Encourage patient to use CPAP for a minimum of 4-6 hours per night -Schedule follow-up in 6-8 weeks to assess CPAP compliance and effectiveness.  Dyspnea with exertion No evidence of COPD or significant abnormalities on pulmonary function tests. Echocardiogram showed no significant abnormalities that would contribute to shortness of breath. Patient's shortness of breath may be related to conditioning or weight related. -Encourage continuation of water aerobics 5 days a week. -Continue monitoring symptoms.  Mitral Regurgitation Mild regurgitation noted on echocardiogram. No  current symptoms or significant impact on patient's health. -Continue blood pressure control to prevent worsening of mitral regurgitation.      Glenford Bayley, NP 10/31/2023

## 2023-10-31 NOTE — Progress Notes (Signed)
Full PFT performed today.

## 2023-11-01 ENCOUNTER — Ambulatory Visit (INDEPENDENT_AMBULATORY_CARE_PROVIDER_SITE_OTHER): Payer: Medicare PPO | Admitting: Family Medicine

## 2023-11-01 ENCOUNTER — Encounter (INDEPENDENT_AMBULATORY_CARE_PROVIDER_SITE_OTHER): Payer: Self-pay | Admitting: Family Medicine

## 2023-11-01 VITALS — BP 127/69 | HR 61 | Temp 98.1°F | Ht 62.0 in | Wt 189.0 lb

## 2023-11-01 DIAGNOSIS — G4730 Sleep apnea, unspecified: Secondary | ICD-10-CM

## 2023-11-01 DIAGNOSIS — E669 Obesity, unspecified: Secondary | ICD-10-CM

## 2023-11-01 DIAGNOSIS — F3289 Other specified depressive episodes: Secondary | ICD-10-CM

## 2023-11-01 DIAGNOSIS — R0602 Shortness of breath: Secondary | ICD-10-CM | POA: Diagnosis not present

## 2023-11-01 DIAGNOSIS — I10 Essential (primary) hypertension: Secondary | ICD-10-CM | POA: Diagnosis not present

## 2023-11-01 DIAGNOSIS — Z6834 Body mass index (BMI) 34.0-34.9, adult: Secondary | ICD-10-CM | POA: Diagnosis not present

## 2023-11-01 DIAGNOSIS — G4733 Obstructive sleep apnea (adult) (pediatric): Secondary | ICD-10-CM

## 2023-11-01 MED ORDER — BUPROPION HCL ER (SR) 150 MG PO TB12: 150.0000 mg | ORAL_TABLET | Freq: Two times a day (BID) | ORAL | 0 refills | Status: DC

## 2023-11-01 MED ORDER — TOPIRAMATE 100 MG PO TABS: 100.0000 mg | ORAL_TABLET | Freq: Every day | ORAL | 0 refills | Status: DC

## 2023-11-01 NOTE — Progress Notes (Signed)
.[facility]  Office: [phone]  /  Fax: [phone]  WEIGHT SUMMARY AND BIOMETRICS  Anthropometric Measurements Height: 5\' 2"  (1.575 m) Weight: 189 lb (85.7 kg) BMI (Calculated): 34.56 Weight at Last Visit: 185 lb Weight Lost Since Last Visit: 0 Weight Gained Since Last Visit: 4 lb Starting Weight: 198 lb Total Weight Loss (lbs): 9 lb (4.082 kg)   Body Composition  Body Fat %: 48.7 % Fat Mass (lbs): 92.2 lbs Muscle Mass (lbs): 92.2 lbs Total Body Water (lbs): 70.6 lbs Visceral Fat Rating : 15   Other Clinical Data Fasting: No Labs: No Today's Visit #: 43 Starting Date: 10/08/20    Chief Complaint: OBESITY   History of Present Illness   The patient presents with obesity.  She has gained four pounds over the last five weeks, coinciding with the holiday season. She follows her category two eating plan infrequently, about fifteen percent of the time. She is consistent with her water exercises, attending five times per week for about two hours each session. She is concerned about her weight due to an upcoming wedding in April.  She has a history of emotional eating behaviors and is currently on Wellbutrin and topiramate to manage this. She requests a refill of these medications today.  She experiences shortness of breath, particularly when walking [facility] She has been prescribed an inhaler but has not used it. A recent breathing test ruled out COPD. She has a history of sleep apnea and has consented to try a CPAP machine again.  She has a history of hypertension, for which she is taking amlodipine and nebivolol. Her blood pressure is well controlled at 127/69.  She mentions a history of rheumatoid arthritis and osteoarthritis. She is currently on leflunomide, which she reports is effective in preventing RA attacks. However, she notes that since discontinuing Plaquenil due to a rash, her osteoarthritis symptoms in her hands have  worsened.  She describes back issues related to scoliosis, which she feels contributes to her shortness of breath and leg weakness. She notes that her right foot feels shorter, affecting her gait and causing back pain.          PHYSICAL EXAM:  Blood pressure 127/69, pulse 61, temperature 98.1 F (36.7 C), height 5\' 2"  (1.575 m), weight 189 lb (85.7 kg), last menstrual period 10/03/1998, SpO2 94%. Body mass index is 34.57 kg/m.  DIAGNOSTIC DATA REVIEWED:  BMET    Component Value Date/Time   NA 137 07/28/2023 1414   K 4.4 07/28/2023 1414   CL 101 07/28/2023 1414   CO2 21 07/28/2023 1414   GLUCOSE 97 07/28/2023 1414   GLUCOSE 91 11/21/2013 1310   BUN 24 07/28/2023 1414   CREATININE 1.01 (H) 07/28/2023 1414   CALCIUM 10.0 07/28/2023 1414   GFRNONAA 71 10/08/2020 0923   GFRAA 82 10/08/2020 0923   Lab Results  Component Value Date   HGBA1C 5.2 04/18/2022   HGBA1C 5.6 10/08/2020   Lab Results  Component Value Date   INSULIN 10.5 04/18/2022   INSULIN 23.3 10/08/2020   Lab Results  Component Value Date   TSH 2.190 04/18/2022   CBC    Component Value Date/Time   WBC 6.0 07/28/2023 1414   RBC 4.43 07/28/2023 1414   HGB 13.3 07/28/2023 1414   HCT 41.1 07/28/2023 1414   PLT 268 07/28/2023 1414   MCV 93 07/28/2023 1414   MCH 30.0 07/28/2023 1414   MCHC 32.4 07/28/2023 1414  RDW 13.3 07/28/2023 1414   Iron Studies No results found for: "IRON", "TIBC", "FERRITIN", "IRONPCTSAT" Lipid Panel     Component Value Date/Time   CHOL 166 04/18/2022 0846   TRIG 93 04/18/2022 0846   HDL 61 04/18/2022 0846   LDLCALC 88 04/18/2022 0846   Hepatic Function Panel     Component Value Date/Time   PROT 7.2 04/18/2022 0846   ALBUMIN 4.3 04/18/2022 0846   AST 20 04/18/2022 0846   ALT 17 04/18/2022 0846   ALKPHOS 109 04/18/2022 0846   BILITOT 0.7 04/18/2022 0846      Component Value Date/Time   TSH 2.190 04/18/2022 0846   Nutritional Lab Results  Component Value Date    VD25OH 66.9 04/18/2022   VD25OH 52.1 07/12/2021   VD25OH 66.0 10/08/2020     Assessment and Plan    Obesity Gained four pounds in the last five weeks, likely due to infrequent adherence to the category two diet plan and holiday indulgences. Consistent with water exercises but needs to improve dietary habits. Emotional eating behaviors managed with Wellbutrin and topiramate. Discussed potential benefits of weight loss on reducing abdominal pressure and improving diaphragmatic function, which may alleviate shortness of breath. Considered alternative diet plans such as vegetarian or pescatarian. - Refill Wellbutrin and topiramate - Encourage adherence to the category two diet plan or consider alternating with vegetarian or pescatarian plans - Ensure adequate protein intake - Continue water exercises  Shortness of Breath Not due to COPD as per recent pulmonary function tests. Possible contributing factors include scoliosis, back pain, and obesity. An inhaler was prescribed but not used. Discussed potential benefits of weight loss on reducing abdominal pressure and improving diaphragmatic function. - Encourage use of the inhaler to assess its effectiveness - Continue weight loss efforts to reduce abdominal pressure and improve diaphragmatic function -continue to follow with your specialists who manage this  Hypertension Well controlled with current medications (amlodipine and nebivolol). Blood pressure today is 127/69. Continued weight loss efforts may further benefit blood pressure control. - Continue current medications (amlodipine and nebivolol) - Monitor blood pressure regularly  Sleep Apnea Consented to retry CPAP therapy despite previous issues with mask discomfort. Discussed improvements in CPAP technology over the past ten years and potential benefits on sleep quality and daytime symptoms. - Initiate CPAP therapy - Monitor for improvement in sleep quality and daytime  symptoms -continue weight loss efforts to improve sleep apnea   Follow-up 4 weeks - Schedule March appointment.        She was informed of the importance of frequent follow up visits to maximize her success with intensive lifestyle modifications for her multiple health conditions.    Quillian Quince, MD

## 2023-11-02 DIAGNOSIS — G4733 Obstructive sleep apnea (adult) (pediatric): Secondary | ICD-10-CM | POA: Diagnosis not present

## 2023-11-21 DIAGNOSIS — M059 Rheumatoid arthritis with rheumatoid factor, unspecified: Secondary | ICD-10-CM | POA: Diagnosis not present

## 2023-11-29 ENCOUNTER — Ambulatory Visit (INDEPENDENT_AMBULATORY_CARE_PROVIDER_SITE_OTHER): Payer: Medicare PPO | Admitting: Family Medicine

## 2023-12-01 DIAGNOSIS — G4733 Obstructive sleep apnea (adult) (pediatric): Secondary | ICD-10-CM | POA: Diagnosis not present

## 2023-12-07 ENCOUNTER — Other Ambulatory Visit (INDEPENDENT_AMBULATORY_CARE_PROVIDER_SITE_OTHER): Payer: Self-pay | Admitting: Family Medicine

## 2023-12-07 DIAGNOSIS — F3289 Other specified depressive episodes: Secondary | ICD-10-CM

## 2023-12-12 ENCOUNTER — Ambulatory Visit: Payer: Medicare PPO | Admitting: Primary Care

## 2023-12-12 ENCOUNTER — Encounter: Payer: Self-pay | Admitting: Primary Care

## 2023-12-12 VITALS — BP 125/73 | HR 57 | Ht 61.0 in | Wt 195.4 lb

## 2023-12-12 DIAGNOSIS — G4733 Obstructive sleep apnea (adult) (pediatric): Secondary | ICD-10-CM

## 2023-12-12 DIAGNOSIS — R0602 Shortness of breath: Secondary | ICD-10-CM

## 2023-12-12 DIAGNOSIS — R053 Chronic cough: Secondary | ICD-10-CM

## 2023-12-12 NOTE — Progress Notes (Signed)
 @Patient  ID: Felicia Franklin, female    DOB: 10-15-1945, 78 y.o.   MRN: [id number]  No chief complaint on file.   Referring provider: Geoffry Paradise, MD  HPI: 78 year old female, never smoked.  Past medical history significant for hypertension, paroxysmal A-fib, OSA, mixed dyslipidemia, prediabetes, vitamin D deficiency, obesity. Patient of Dr. Wynona Neat, last seen in office October 2024.   12/12/2023 - Interim hx  Patient presents today for 6 to 8-week follow-up.  Patient has obstructive sleep apnea moderate severity.  Previously struggled with CPAP tolerance but during her last office visit in January she was willing to retry.  Patient is 93% compliant with CPAP use over the last 30 days with an average usage 6 hours 37 minutes.  Patient is auto settings 5 to 15 cm H2O with residual AHI 2.5 events per hour.  Discussed the use of AI scribe software for clinical note transcription with the patient, who gave verbal consent to proceed.  History of Present Illness   The patient, with moderate sleep apnea, presents with issues related to sleep apnea management and chronic cough.  She has moderate sleep apnea, confirmed by a sleep study in December showing nineteen apneic events per hour. Since January, she has been using a new CPAP machine consistently, with a usage rate of ninety-three percent over the last thirty days, averaging six and a half hours per night. The pressure settings are auto-set, with a minimum of five and a maximum of fifteen, and her average pressure is thirteen. Her apnea score has improved to 2.5 with CPAP use, indicating well-controlled apnea.  Despite the improvement in her apnea score, she experiences significant discomfort with the CPAP mask, describing it as irritating to her nose and causing soreness and bleeding when she blows her nose. She uses a nasal pillow mask and experiences nasal irritation despite using humidification, which she finds difficult to manage due  to the design of the water reservoir.  She has a chronic cough, which she describes as socially embarrassing, especially during the COVID pandemic. She reports mucus production but denies feeling sick. She recalls a previous mention of 'little black spots' on an x-ray, identified as calcified granulomas, and expresses concern about their nature and cause.  She also reports persistent shortness of breath, which has not changed. She uses an albuterol inhaler but is unsure of its effectiveness. She experiences difficulty walking [facility], which is her primary challenge. She has not noticed a significant change in her symptoms with the use of the inhaler. No fever is present.     [facility] download 11/11/2023 - 12/10/2023 Usage days 28/30 days (93%) greater than 4 hours Average usage 6 hours 37 minutes Pressure 5 to 15 cm H2O (14.8 cm H2O-95%) Air leaks median 9.8 L/min: 95th percentile 32.4 L/min AHI 2.5   10/31/2023 PFT>> FVC 1.91 (75%), FEV1 1.66 (87%), ratio 87, TLC 88%, DLCO 18.30 (102%) Mild restriction noted on flow-volume loop  Allergies  Allergen Reactions   Plaquenil [Hydroxychloroquine]    Triple Antibiotic [Bacitracin-Neomycin-Polymyxin] Rash    Immunization History  Administered Date(s) Administered   Influenza Split 07/08/2013   Influenza Whole 07/03/2012   Influenza-Unspecified 07/12/2023   PFIZER(Purple Top)SARS-COV-2 Vaccination 11/17/2019, 12/09/2019   Tdap 10/23/2014    Past Medical History:  Diagnosis Date   Anxiety    Anxiety    Arthritis    Back pain    Constipation    Essential hypertension  Hyperlipidemia    Joint pain    Lightheadedness 03/17/2022   Lower back pain    Osteoarthritis    Other fatigue    Paroxysmal atrial fibrillation (HCC)    PONV (postoperative nausea and vomiting)    severe-also can get agitated waking up   Shortness of breath on exertion    Sleep apnea    Wears glasses     Tobacco  History: Social History   Tobacco Use  Smoking Status Never  Smokeless Tobacco Never   Counseling given: Not Answered   Outpatient Medications Prior to Visit  Medication Sig Dispense Refill   albuterol (VENTOLIN HFA) 108 (90 Base) MCG/ACT inhaler Inhale 2 puffs into the lungs every 6 (six) hours as needed for wheezing or shortness of breath. 8 g 6   aliskiren (TEKTURNA) 300 MG tablet Take 300 mg by mouth daily.     amLODipine (NORVASC) 10 MG tablet Take 10 mg by mouth daily.     ascorbic acid (VITAMIN C) 500 MG tablet Take 500 mg by mouth daily.     buPROPion (WELLBUTRIN SR) 150 MG 12 hr tablet Take 1 tablet (150 mg total) by mouth 2 (two) times daily. 180 tablet 0   Collagen-Boron-Hyaluronic Acid (MOVE FREE ULTRA JOINT HEALTH PO) Take 1 tablet by mouth daily.     Docusate Sodium (COLACE PO) Take by mouth.     ELIQUIS 5 MG TABS tablet TAKE 1 TABLET(5 MG) BY MOUTH TWICE DAILY 180 tablet 1   leflunomide (ARAVA) 20 MG tablet Take 20 mg by mouth daily.     Multiple Vitamins-Minerals (CENTRUM SILVER PO) Take 1 tablet by mouth daily.     Nebivolol HCl 20 MG TABS TAKE 1 TABLET(20 MG) BY MOUTH DAILY 90 tablet 1   Polyethylene Glycol 3350 (MIRALAX PO) Take by mouth daily.      simvastatin (ZOCOR) 40 MG tablet Take 40 mg by mouth every evening.     spironolactone (ALDACTONE) 25 MG tablet Take 1 tablet (25 mg total) by mouth every morning. 90 tablet 3   topiramate (TOPAMAX) 100 MG tablet Take 1 tablet (100 mg total) by mouth at bedtime. 30 tablet 0   No facility-administered medications prior to visit.   Review of Systems  Review of Systems  Constitutional: Negative.  Negative for fatigue and fever.  HENT: Negative.    Respiratory:  Positive for cough and shortness of breath.   Cardiovascular: Negative.     Physical Exam  LMP 10/03/1998  Physical Exam Constitutional:      Appearance: Normal appearance.  HENT:     Head: Normocephalic and atraumatic.  Cardiovascular:     Rate and  Rhythm: Normal rate and regular rhythm.  Pulmonary:     Effort: Pulmonary effort is normal.     Breath sounds: No wheezing or rhonchi.  Neurological:     General: No focal deficit present.     Mental Status: She is alert and oriented to person, place, and time. Mental status is at baseline.  Psychiatric:        Mood and Affect: Mood normal.        Behavior: Behavior normal.        Thought Content: Thought content normal.        Judgment: Judgment normal.      Lab Results:  CBC    Component Value Date/Time   WBC 6.0 07/28/2023 1414   RBC 4.43 07/28/2023 1414   HGB 13.3 07/28/2023 1414   HCT 41.1 07/28/2023  1414   PLT 268 07/28/2023 1414   MCV 93 07/28/2023 1414   MCH 30.0 07/28/2023 1414   MCHC 32.4 07/28/2023 1414   RDW 13.3 07/28/2023 1414   LYMPHSABS 1.6 04/18/2022 0846   EOSABS 0.2 04/18/2022 0846   BASOSABS 0.1 04/18/2022 0846    BMET    Component Value Date/Time   NA 137 07/28/2023 1414   K 4.4 07/28/2023 1414   CL 101 07/28/2023 1414   CO2 21 07/28/2023 1414   GLUCOSE 97 07/28/2023 1414   GLUCOSE 91 11/21/2013 1310   BUN 24 07/28/2023 1414   CREATININE 1.01 (H) 07/28/2023 1414   CALCIUM 10.0 07/28/2023 1414   GFRNONAA 71 10/08/2020 0923   GFRAA 82 10/08/2020 0923    BNP    Component Value Date/Time   BNP 19.2 08/06/2021 1333    ProBNP No results found for: "PROBNP"  Imaging: No results found.   Assessment & Plan:   1. Chronic cough (Primary) - Respiratory or Resp and Sputum Culture; Future - Fungus Culture & Smear; Future  2. OSA (obstructive sleep apnea) - Ambulatory Referral for DME   No problem-specific Assessment & Plan notes found for this encounter.  Chronic cough Possible UACS, GERD and PND could be contributing Recommend trial PPI and steroid nasal spray Checking sputum sample  Follow up with Dr. Val Eagle in June  Calcified granulomas Stable, no further work up needed  Assessment and Plan    Obstructive Sleep Apnea Moderate  obstructive sleep apnea well-controlled with CPAP. Nasal irritation from mask noted, affecting compliance. - Increase humidification on CPAP machine. - Provide samples of saline nasal spray and nasal gel for nasal moisture. - Lower CPAP pressure 11-14cm h20. - Order refitting for a different nasal mask, recommending the [employer] Respironics Dreamwear nasal mask. - Educated on CPAP mask options and suggested purchasing from cpap.com or amazon.com if needed.  Chronic Cough Chronic cough with mucus production. Calcified granulomas noted on imaging  - Obtain sputum sample to rule out bacterial or fungal infection. - Consult with Dr. Wynona Neat regarding further workup   Shortness of Breath Persistent shortness of breath possibly related to deconditioning or weight. Albuterol inhaler prescribed but not used. - Instruct to trial albuterol inhaler 15-20 minutes before exertion. - Educate on proper use of albuterol inhaler and provide instructional resources. - Advise to keep a symptom diary to assess the effectiveness of albuterol.      Glenford Bayley, NP 12/12/2023

## 2023-12-12 NOTE — Patient Instructions (Addendum)
  Learn more How to Use a Rescue Inhaler: 10 Easy-to-Follow Steps - GoodRx To use an HFA inhaler, shake the inhaler well before each use, remove the cap, place the mouthpiece in your mouth, seal your lips around it, breathe out fully, then begin inhaling slowly and deeply while pressing down on the canister to release the medication; hold your breath for a few seconds after inhaling, then exhale slowly; remember to consult your doctor or pharmacist for specific instructions regarding your inhaler and dosage.  Key steps: Shake well: Always shake the inhaler thoroughly before using it to mix the medication evenly.  Remove the cap: Take off the cap from the mouthpiece.  Breathe out: Exhale completely before starting to inhale.  Place in mouth: Put the mouthpiece into your mouth and seal your lips around it.  Inhale and press: Begin inhaling slowly and deeply while simultaneously pressing down on the canister to release the medication.  Hold breath: Once you have inhaled, hold your breath for a few seconds.  Exhale slowly: Breathe out gently.  Important points to remember: Use a spacer if needed: Consult your doctor if you need to use a spacer device with your HFA inhaler, especially for children.    YOUR PLAN:  -OBSTRUCTIVE SLEEP APNEA: Obstructive sleep apnea is a condition where your airway becomes blocked during sleep, causing breathing pauses. Your condition is well-controlled with your CPAP machine, but you are experiencing nasal irritation. We will increase the humidification on your CPAP machine, provide saline nasal spray and nasal gel samples, lower the CPAP pressure slightly, and arrange for a refitting of your nasal mask. We recommend trying the [employer] nasal mask and purchasing from cpap.com or amazon.com if needed.  -CHRONIC COUGH: A chronic cough is a cough that lasts for a long time and can be caused by various factors. 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 We will obtain a sputum sample to check for infections and consult with Dr. Wynona Neat for further evaluation of possible autoimmune conditions or the need for a biopsy of the granulomas. Routine labs may also be considered to rule out autoimmune disorders.  -SHORTNESS OF BREATH: Shortness of breath can be due to various reasons, including deconditioning or weight. You have been prescribed an albuterol inhaler but have not used it regularly. We advise you to use the inhaler 15-20 minutes before physical activity, and we will provide resources on how to use it properly. Additionally, keeping a symptom diary will help [place] assess its effectiveness.  INSTRUCTIONS: Please follow up with the sputum sample as discussed. We will also arrange a consultation with Dr. Wynona Neat for further evaluation of your chronic cough and granulomas seen in the lungs. Use the albuterol inhaler as instructed and keep a symptom diary to track your shortness of breath. If you have any questions or concerns, please do not hesitate to contact our office.  Follow-up 3 months with Dr. Wynona Neat

## 2023-12-13 ENCOUNTER — Encounter (INDEPENDENT_AMBULATORY_CARE_PROVIDER_SITE_OTHER): Payer: Self-pay | Admitting: Family Medicine

## 2023-12-13 ENCOUNTER — Ambulatory Visit (INDEPENDENT_AMBULATORY_CARE_PROVIDER_SITE_OTHER): Payer: Medicare PPO | Admitting: Family Medicine

## 2023-12-13 VITALS — BP 150/74 | HR 54 | Temp 98.1°F | Ht 62.0 in | Wt 188.0 lb

## 2023-12-13 DIAGNOSIS — Z6834 Body mass index (BMI) 34.0-34.9, adult: Secondary | ICD-10-CM | POA: Diagnosis not present

## 2023-12-13 DIAGNOSIS — I10 Essential (primary) hypertension: Secondary | ICD-10-CM

## 2023-12-13 DIAGNOSIS — F5089 Other specified eating disorder: Secondary | ICD-10-CM | POA: Diagnosis not present

## 2023-12-13 DIAGNOSIS — E669 Obesity, unspecified: Secondary | ICD-10-CM | POA: Diagnosis not present

## 2023-12-13 DIAGNOSIS — F3289 Other specified depressive episodes: Secondary | ICD-10-CM

## 2023-12-13 MED ORDER — TOPIRAMATE 100 MG PO TABS: 100.0000 mg | ORAL_TABLET | Freq: Every day | ORAL | 0 refills | Status: DC

## 2023-12-13 NOTE — Progress Notes (Signed)
 Office: [phone]  /  Fax: [phone]  WEIGHT SUMMARY AND BIOMETRICS  Anthropometric Measurements Height: 5\' 2"  (1.575 m) Weight: 188 lb (85.3 kg) BMI (Calculated): 34.38 Weight at Last Visit: 189 lb Weight Lost Since Last Visit: 1 lb Weight Gained Since Last Visit: 0 Starting Weight: 198 lb Total Weight Loss (lbs): 10 lb (4.536 kg)   Body Composition  Body Fat %: 49.3 % Fat Mass (lbs): 93 lbs Muscle Mass (lbs): 90.8 lbs Total Body Water (lbs): 71.6 lbs Visceral Fat Rating : 16   Other Clinical Data Fasting: No Labs: No Today's Visit #: 14 Starting Date: 10/08/20    Chief Complaint: OBESITY   History of Present Illness   The patient presents with obesity management and treatment plan discussion.  She is following her category two plan only about twenty percent of the time. She engages in water exercises for one to two hours, six times per week, and has lost one pound in the last six weeks since her last visit. She has a history of emotional eating behaviors.  She is currently taking Wellbutrin SR 150 mg BID for her emotional eating behaviors. She requests a refill for topiramate 100 mg QHS.  She has a history of hypertension and is being treated with amlodipine 10 mg. Her blood pressure was elevated today at 144/73, and upon repeat, it was 150/74. She mentions a recent stressful event involving a car breakdown and a prolonged wait for assistance, which may have contributed to her elevated blood pressure.          PHYSICAL EXAM:  Blood pressure (!) 150/74, pulse (!) 54, temperature 98.1 F (36.7 C), height 5\' 2"  (1.575 m), weight 188 lb (85.3 kg), last menstrual period 10/03/1998, SpO2 97%. Body mass index is 34.39 kg/m.  DIAGNOSTIC DATA REVIEWED:  BMET    Component Value Date/Time   NA 137 07/28/2023 1414   K 4.4 07/28/2023 1414   CL 101 07/28/2023 1414   CO2 21 07/28/2023 1414   GLUCOSE 97 07/28/2023 1414   GLUCOSE 91 11/21/2013 1310   BUN 24  07/28/2023 1414   CREATININE 1.01 (H) 07/28/2023 1414   CALCIUM 10.0 07/28/2023 1414   GFRNONAA 71 10/08/2020 0923   GFRAA 82 10/08/2020 0923   Lab Results  Component Value Date   HGBA1C 5.2 04/18/2022   HGBA1C 5.6 10/08/2020   Lab Results  Component Value Date   INSULIN 10.5 04/18/2022   INSULIN 23.3 10/08/2020   Lab Results  Component Value Date   TSH 2.190 04/18/2022   CBC    Component Value Date/Time   WBC 6.0 07/28/2023 1414   RBC 4.43 07/28/2023 1414   HGB 13.3 07/28/2023 1414   HCT 41.1 07/28/2023 1414   PLT 268 07/28/2023 1414   MCV 93 07/28/2023 1414   MCH 30.0 07/28/2023 1414   MCHC 32.4 07/28/2023 1414   RDW 13.3 07/28/2023 1414   Iron Studies No results found for: "IRON", "TIBC", "FERRITIN", "IRONPCTSAT" Lipid Panel     Component Value Date/Time   CHOL 166 04/18/2022 0846   TRIG 93 04/18/2022 0846   HDL 61 04/18/2022 0846   LDLCALC 88 04/18/2022 0846   Hepatic Function Panel     Component Value Date/Time   PROT 7.2 04/18/2022 0846   ALBUMIN 4.3 04/18/2022 0846   AST 20 04/18/2022 0846   ALT 17 04/18/2022 0846   ALKPHOS 109 04/18/2022 0846   BILITOT 0.7 04/18/2022 0846      Component Value Date/Time  TSH 2.190 04/18/2022 0846   Nutritional Lab Results  Component Value Date   VD25OH 66.9 04/18/2022   VD25OH 52.1 07/12/2021   VD25OH 66.0 10/08/2020     Assessment and Plan    Obesity She adheres to her category two plan approximately 20% of the time. Engages in water exercises for 1-2 hours, six times weekly, resulting in a 1-pound weight loss over six weeks. Emotional eating behaviors are managed with topiramate and Wellbutrin SR. Challenges in dietary adherence are acknowledged, with encouragement to continue efforts without requiring perfection. Emphasis on protein intake during celebration meals and sharing desserts to prevent overindulgence is suggested. - Refill topiramate 100 mg QHS - Continue Wellbutrin SR 150 mg BID - Encourage  continued water exercises - Focus on protein intake during celebration meals - Share desserts to avoid overindulgence  Emotional eating behaviors Emotional eating behaviors are managed with topiramate and Wellbutrin SR. Challenges in meal planning are acknowledged, with encouragement to organize meals and make healthier choices. - Refill topiramate 100 mg QHS - Continue Wellbutrin SR 150 mg BID - Encourage meal planning and healthier food choices  Hypertension Blood pressure readings are elevated at 144/73 and 150/74. She is on amlodipine 10 mg. Importance of home monitoring to determine if elevated readings are consistent or situational is discussed. Target blood pressure is <140/90. Advised to monitor blood pressure once or twice weekly to assess trends. Persistent elevation post-activity may indicate a cardiac issue. - Monitor blood pressure at home once or twice a week - Discuss potential need for medication adjustment if elevated readings persist        She was informed of the importance of frequent follow up visits to maximize her success with intensive lifestyle modifications for her multiple health conditions.    Quillian Quince, MD

## 2023-12-16 ENCOUNTER — Other Ambulatory Visit (HOSPITAL_BASED_OUTPATIENT_CLINIC_OR_DEPARTMENT_OTHER): Payer: Self-pay | Admitting: Family

## 2023-12-16 DIAGNOSIS — I10 Essential (primary) hypertension: Secondary | ICD-10-CM

## 2023-12-19 MED ORDER — OMEPRAZOLE 20 MG PO CPDR: 20.0000 mg | DELAYED_RELEASE_CAPSULE | Freq: Two times a day (BID) | ORAL | 2 refills | Status: AC

## 2023-12-19 MED ORDER — FLUTICASONE PROPIONATE 50 MCG/ACT NA SUSP: 1.0000 | Freq: Every day | NASAL | 2 refills | Status: AC

## 2023-12-27 ENCOUNTER — Ambulatory Visit (INDEPENDENT_AMBULATORY_CARE_PROVIDER_SITE_OTHER): Payer: Medicare PPO | Admitting: Family Medicine

## 2023-12-31 DIAGNOSIS — G4733 Obstructive sleep apnea (adult) (pediatric): Secondary | ICD-10-CM | POA: Diagnosis not present

## 2024-01-01 DIAGNOSIS — M154 Erosive (osteo)arthritis: Secondary | ICD-10-CM | POA: Diagnosis not present

## 2024-01-01 DIAGNOSIS — E669 Obesity, unspecified: Secondary | ICD-10-CM | POA: Diagnosis not present

## 2024-01-01 DIAGNOSIS — Z6836 Body mass index (BMI) 36.0-36.9, adult: Secondary | ICD-10-CM | POA: Diagnosis not present

## 2024-01-01 DIAGNOSIS — M059 Rheumatoid arthritis with rheumatoid factor, unspecified: Secondary | ICD-10-CM | POA: Diagnosis not present

## 2024-01-01 DIAGNOSIS — Z79899 Other long term (current) drug therapy: Secondary | ICD-10-CM | POA: Diagnosis not present

## 2024-01-15 ENCOUNTER — Encounter (INDEPENDENT_AMBULATORY_CARE_PROVIDER_SITE_OTHER): Payer: Self-pay | Admitting: Family Medicine

## 2024-01-15 ENCOUNTER — Ambulatory Visit (INDEPENDENT_AMBULATORY_CARE_PROVIDER_SITE_OTHER): Payer: Medicare PPO | Admitting: Family Medicine

## 2024-01-15 VITALS — BP 124/73 | HR 53 | Temp 98.1°F | Ht 62.0 in | Wt 189.0 lb

## 2024-01-15 DIAGNOSIS — E669 Obesity, unspecified: Secondary | ICD-10-CM

## 2024-01-15 DIAGNOSIS — F5089 Other specified eating disorder: Secondary | ICD-10-CM | POA: Diagnosis not present

## 2024-01-15 DIAGNOSIS — I10 Essential (primary) hypertension: Secondary | ICD-10-CM | POA: Diagnosis not present

## 2024-01-15 DIAGNOSIS — F3289 Other specified depressive episodes: Secondary | ICD-10-CM

## 2024-01-15 DIAGNOSIS — Z6834 Body mass index (BMI) 34.0-34.9, adult: Secondary | ICD-10-CM | POA: Diagnosis not present

## 2024-01-15 MED ORDER — TOPIRAMATE 100 MG PO TABS: 100.0000 mg | ORAL_TABLET | Freq: Every day | ORAL | 0 refills | Status: DC

## 2024-01-15 MED ORDER — BUPROPION HCL ER (SR) 150 MG PO TB12: 150.0000 mg | ORAL_TABLET | Freq: Two times a day (BID) | ORAL | 0 refills | Status: DC

## 2024-01-15 NOTE — Progress Notes (Signed)
 Office: [phone]  /  Fax: [phone]  WEIGHT SUMMARY AND BIOMETRICS  Anthropometric Measurements Height: 5\' 2"  (1.575 m) Weight: 189 lb (85.7 kg) BMI (Calculated): 34.56 Weight at Last Visit: 188lb Weight Lost Since Last Visit: 0lb Weight Gained Since Last Visit: 1lb Starting Weight: 198lb Total Weight Loss (lbs): 9 lb (4.082 kg)   Body Composition  Body Fat %: 49.4 % Fat Mass (lbs): 93.6 lbs Muscle Mass (lbs): 91.2 lbs Total Body Water (lbs): 72 lbs Visceral Fat Rating : 16   Other Clinical Data Fasting: No Labs: No Today's Visit #: 45 Starting Date: 10/08/20    Chief Complaint: OBESITY  History of Present Illness She is a 78 year old female with obesity and hypertension who presents for obesity treatment assessment and progress evaluation.  She is following her prescribed category two eating plan only about twenty percent of the time. Despite this, she has gained one pound in the last month since her last visit. She attended a wedding in [place] recently, where she consumed [place] food and large breakfasts, which she attributes to her weight gain. She struggles with a sweet tooth and is working on hydration, which is challenging due to her morning pool activities.  She is exercising regularly, engaging in water walking and water aerobics for about one and a half hours, five times per week. She has a history of emotional eating behaviors and is currently being treated with Wellbutrin and topiramate. She requests a refill of these medications. She inquires about the timing of her topiramate dose, as she has been taking it at bedtime but is considering taking it in the morning instead. She has difficulty remembering to take her medications at off times, particularly the second dose of Wellbutrin, which she often forgets to take when not at home during lunchtime.  She has a history of hypertension and is being treated with amlodipine 10 mg, nebivolol 20 mg, and  spironolactone 25 mg. Her blood pressure is controlled today at 124/73. She mentions that her blood pressure has been higher at other doctor visits, around 130s, but she often forgets to take her blood pressure at home.      PHYSICAL EXAM:  Blood pressure 124/73, pulse (!) 53, temperature 98.1 F (36.7 C), height 5\' 2"  (1.575 m), weight 189 lb (85.7 kg), last menstrual period 10/03/1998, SpO2 98%. Body mass index is 34.57 kg/m.  DIAGNOSTIC DATA REVIEWED:  BMET    Component Value Date/Time   NA 137 07/28/2023 1414   K 4.4 07/28/2023 1414   CL 101 07/28/2023 1414   CO2 21 07/28/2023 1414   GLUCOSE 97 07/28/2023 1414   GLUCOSE 91 11/21/2013 1310   BUN 24 07/28/2023 1414   CREATININE 1.01 (H) 07/28/2023 1414   CALCIUM 10.0 07/28/2023 1414   GFRNONAA 71 10/08/2020 0923   GFRAA 82 10/08/2020 0923   Lab Results  Component Value Date   HGBA1C 5.2 04/18/2022   HGBA1C 5.6 10/08/2020   Lab Results  Component Value Date   INSULIN 10.5 04/18/2022   INSULIN 23.3 10/08/2020   Lab Results  Component Value Date   TSH 2.190 04/18/2022   CBC    Component Value Date/Time   WBC 6.0 07/28/2023 1414   RBC 4.43 07/28/2023 1414   HGB 13.3 07/28/2023 1414   HCT 41.1 07/28/2023 1414   PLT 268 07/28/2023 1414   MCV 93 07/28/2023 1414   MCH 30.0 07/28/2023 1414   MCHC 32.4 07/28/2023 1414   RDW 13.3 07/28/2023 1414  Iron Studies No results found for: "IRON", "TIBC", "FERRITIN", "IRONPCTSAT" Lipid Panel     Component Value Date/Time   CHOL 166 04/18/2022 0846   TRIG 93 04/18/2022 0846   HDL 61 04/18/2022 0846   LDLCALC 88 04/18/2022 0846   Hepatic Function Panel     Component Value Date/Time   PROT 7.2 04/18/2022 0846   ALBUMIN 4.3 04/18/2022 0846   AST 20 04/18/2022 0846   ALT 17 04/18/2022 0846   ALKPHOS 109 04/18/2022 0846   BILITOT 0.7 04/18/2022 0846      Component Value Date/Time   TSH 2.190 04/18/2022 0846   Nutritional Lab Results  Component Value Date    VD25OH 66.9 04/18/2022   VD25OH 52.1 07/12/2021   VD25OH 66.0 10/08/2020     Assessment and Plan Assessment & Plan Obesity and Emotional Eating Behaviors She adheres to her prescribed category two eating plan approximately 20% of the time and participates in water walking and water aerobics for 1.5 hours, five times per week. Despite these efforts, she gained one pound in the last month, likely due to water retention from dietary sodium intake. Emotional eating behaviors are present, and she is treated with Wellbutrin and topiramate. Increasing the Wellbutrin dose was considered, but the focus will be on improving adherence to the current regimen. She was informed that topiramate can be taken at any time of day, but 10-15% of individuals experience fatigue, so it is often recommended at bedtime. - Continue current exercise regimen. - Encourage adherence to the prescribed eating plan. - Refill Wellbutrin and topiramate prescriptions. - Consider increasing Wellbutrin dose if adherence does not improve.  Hypertension Her blood pressure is well-controlled at 124/73 mmHg. She is on amlodipine 10 mg, nebivolol 20 mg, and spironolactone 25 mg. Previous readings were in the 130s, which is less desirable than the current reading. - Continue current antihypertensive medications. - Encourage regular blood pressure monitoring at home.  General Health Maintenance She engages in physical activity, beneficial for overall health and osteoporosis prevention. Emphasis was placed on maintaining muscle mass and protein intake. - Encourage continued physical activity and protein intake.      She was informed of the importance of frequent follow up visits to maximize her success with intensive lifestyle modifications for her multiple health conditions.    Jasmine Mesi, MD

## 2024-01-30 DIAGNOSIS — H18413 Arcus senilis, bilateral: Secondary | ICD-10-CM | POA: Diagnosis not present

## 2024-01-30 DIAGNOSIS — H2511 Age-related nuclear cataract, right eye: Secondary | ICD-10-CM | POA: Diagnosis not present

## 2024-01-30 DIAGNOSIS — H25043 Posterior subcapsular polar age-related cataract, bilateral: Secondary | ICD-10-CM | POA: Diagnosis not present

## 2024-01-30 DIAGNOSIS — H25013 Cortical age-related cataract, bilateral: Secondary | ICD-10-CM | POA: Diagnosis not present

## 2024-01-30 DIAGNOSIS — H2513 Age-related nuclear cataract, bilateral: Secondary | ICD-10-CM | POA: Diagnosis not present

## 2024-01-31 DIAGNOSIS — G4733 Obstructive sleep apnea (adult) (pediatric): Secondary | ICD-10-CM | POA: Diagnosis not present

## 2024-02-09 ENCOUNTER — Other Ambulatory Visit (HOSPITAL_BASED_OUTPATIENT_CLINIC_OR_DEPARTMENT_OTHER): Payer: Self-pay | Admitting: Family

## 2024-02-12 ENCOUNTER — Encounter (INDEPENDENT_AMBULATORY_CARE_PROVIDER_SITE_OTHER): Payer: Self-pay | Admitting: Family Medicine

## 2024-02-12 ENCOUNTER — Ambulatory Visit (INDEPENDENT_AMBULATORY_CARE_PROVIDER_SITE_OTHER): Admitting: Family Medicine

## 2024-02-12 VITALS — BP 139/75 | HR 56 | Temp 97.7°F | Ht 62.0 in | Wt 191.0 lb

## 2024-02-12 DIAGNOSIS — Z6834 Body mass index (BMI) 34.0-34.9, adult: Secondary | ICD-10-CM

## 2024-02-12 DIAGNOSIS — F3289 Other specified depressive episodes: Secondary | ICD-10-CM

## 2024-02-12 DIAGNOSIS — F5089 Other specified eating disorder: Secondary | ICD-10-CM

## 2024-02-12 DIAGNOSIS — M4690 Unspecified inflammatory spondylopathy, site unspecified: Secondary | ICD-10-CM

## 2024-02-12 DIAGNOSIS — M545 Low back pain, unspecified: Secondary | ICD-10-CM

## 2024-02-12 DIAGNOSIS — I10 Essential (primary) hypertension: Secondary | ICD-10-CM

## 2024-02-12 DIAGNOSIS — M4800 Spinal stenosis, site unspecified: Secondary | ICD-10-CM | POA: Diagnosis not present

## 2024-02-12 DIAGNOSIS — E669 Obesity, unspecified: Secondary | ICD-10-CM | POA: Diagnosis not present

## 2024-02-12 DIAGNOSIS — Z6835 Body mass index (BMI) 35.0-35.9, adult: Secondary | ICD-10-CM

## 2024-02-12 DIAGNOSIS — E66812 Obesity, class 2: Secondary | ICD-10-CM

## 2024-02-12 MED ORDER — TOPIRAMATE 100 MG PO TABS: 100.0000 mg | ORAL_TABLET | Freq: Every day | ORAL | 0 refills | Status: DC

## 2024-02-12 NOTE — Progress Notes (Signed)
 Office: [phone]  /  Fax: [phone]  WEIGHT SUMMARY AND BIOMETRICS  Anthropometric Measurements Height: 5\' 2"  (1.575 m) Weight: 191 lb (86.6 kg) BMI (Calculated): 34.93 Weight at Last Visit: 189 lb Weight Lost Since Last Visit: 0 Weight Gained Since Last Visit: 2 lb Starting Weight: 198 lb Total Weight Loss (lbs): 7 lb (3.175 kg) Peak Weight: 225 lb   Body Composition  Body Fat %: 49.5 % Fat Mass (lbs): 94.6 lbs Muscle Mass (lbs): 91.6 lbs Total Body Water (lbs): 71.8 lbs Visceral Fat Rating : 16   Other Clinical Data Fasting: no Labs: no Today's Visit #: 46 Starting Date: 10/08/20    Chief Complaint: OBESITY   History of Present Illness Felicia Franklin "Felicia Franklin" is a 78 year old female who presents for obesity treatment plan assessment and progress evaluation.  She has been adhering to her category two eating plan approximately 25% of the time and engages in water aerobics for two hours, five times a week. Despite these efforts, she has gained two pounds in the last month since her last visit.  She has a long-standing history of back issues, including stenosis and arthritis, which have been problematic since her twenties. She has consulted orthopedists and a neurologist in the past but was not considered a candidate for surgery. She has tried various pain relief methods, including injections, but has not pursued treatment in recent years. She experiences more numbness and a lack of control in her legs rather than pain, and she becomes breathless with exertion. No actual weakness in her legs is noted as she does not walk far.  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 They chip, peel, and have a slight scoop at the end.  Her current medications include amlodipine 10 mg, nebivolol  20 mg, and spironolactone  25 mg daily.      PHYSICAL EXAM:  Blood pressure 139/75,  pulse (!) 56, temperature 97.7 F (36.5 C), height 5\' 2"  (1.575 m), weight 191 lb (86.6 kg), last menstrual period 10/03/1998, SpO2 97%. Body mass index is 34.93 kg/m.  DIAGNOSTIC DATA REVIEWED:  BMET    Component Value Date/Time   NA 137 07/28/2023 1414   K 4.4 07/28/2023 1414   CL 101 07/28/2023 1414   CO2 21 07/28/2023 1414   GLUCOSE 97 07/28/2023 1414   GLUCOSE 91 11/21/2013 1310   BUN 24 07/28/2023 1414   CREATININE 1.01 (H) 07/28/2023 1414   CALCIUM 10.0 07/28/2023 1414   GFRNONAA 71 10/08/2020 0923   GFRAA 82 10/08/2020 0923   Lab Results  Component Value Date   HGBA1C 5.2 04/18/2022   HGBA1C 5.6 10/08/2020   Lab Results  Component Value Date   INSULIN  10.5 04/18/2022   INSULIN  23.3 10/08/2020   Lab Results  Component Value Date   TSH 2.190 04/18/2022   CBC    Component Value Date/Time   WBC 6.0 07/28/2023 1414   RBC 4.43 07/28/2023 1414   HGB 13.3 07/28/2023 1414   HCT 41.1 07/28/2023 1414   PLT 268 07/28/2023 1414   MCV 93 07/28/2023 1414   MCH 30.0 07/28/2023 1414   MCHC 32.4 07/28/2023 1414   RDW 13.3 07/28/2023 1414   Iron Studies No results found for: "IRON", "TIBC", "FERRITIN", "IRONPCTSAT" Lipid Panel     Component Value Date/Time   CHOL 166 04/18/2022 0846   TRIG 93 04/18/2022 0846   HDL 61 04/18/2022 0846  LDLCALC 88 04/18/2022 0846   Hepatic Function Panel     Component Value Date/Time   PROT 7.2 04/18/2022 0846   ALBUMIN 4.3 04/18/2022 0846   AST 20 04/18/2022 0846   ALT 17 04/18/2022 0846   ALKPHOS 109 04/18/2022 0846   BILITOT 0.7 04/18/2022 0846      Component Value Date/Time   TSH 2.190 04/18/2022 0846   Nutritional Lab Results  Component Value Date   VD25OH 66.9 04/18/2022   VD25OH 52.1 07/12/2021   VD25OH 66.0 10/08/2020     Assessment and Plan Assessment & Plan Low back pain with spinal stenosis and arthritis Chronic low back pain due to spinal stenosis and arthritis with increased numbness and leg  disobedience rather than pain. She has a long history of back issues and has tried various treatments including injections. Consideration of specialist re-evaluation for new treatment options or advancements, as it has been a few years since last evaluation. - Consider referral to a specialist for re-evaluation of back pain management.  Hypertension Blood pressure is borderline at 139/75 mmHg. She is on amlodipine 10 mg, nebivolol  20 mg, and spironolactone  25 mg daily. Current regimen maintains blood pressure within acceptable limits.  Obesity Weight increased by 2 pounds in the last month. Adheres to category two eating plan 25% of the time and continues water aerobics for two hours, five times a week. Hunger levels are healthy, but exercise may be increasing appetite. Discussed a temporary low-carb diet to reset cravings and promote fat burning, emphasizing strict adherence for two weeks to maximize effectiveness. Explained that deviation from the diet can halt fat burning for 2-3 days and that the plan may cause dehydration if not careful. - Implement a low-carb diet for two weeks with strict adherence. - Continue water aerobics five times a week.  Emotional eating behaviors Emotional eating behaviors are well-managed with topiramate . - Refill topiramate .      She was informed of the importance of frequent follow up visits to maximize her success with intensive lifestyle modifications for her multiple health conditions.    Felicia Mesi, MD

## 2024-02-16 DIAGNOSIS — G4733 Obstructive sleep apnea (adult) (pediatric): Secondary | ICD-10-CM | POA: Diagnosis not present

## 2024-03-01 DIAGNOSIS — G4733 Obstructive sleep apnea (adult) (pediatric): Secondary | ICD-10-CM | POA: Diagnosis not present

## 2024-03-13 ENCOUNTER — Ambulatory Visit: Admitting: Pulmonary Disease

## 2024-03-13 VITALS — BP 133/74 | HR 58 | Ht 62.0 in | Wt 188.4 lb

## 2024-03-13 DIAGNOSIS — G4733 Obstructive sleep apnea (adult) (pediatric): Secondary | ICD-10-CM

## 2024-03-13 DIAGNOSIS — R0602 Shortness of breath: Secondary | ICD-10-CM

## 2024-03-13 NOTE — Patient Instructions (Signed)
 I will see you back in about 6 months  Continue albuterol  as needed  Graded exercise as tolerated  Look into the devices we looked at online today to see if one of them will help you  Call with significant concerns

## 2024-03-13 NOTE — Progress Notes (Signed)
 Felicia Franklin    [id number]    03/03/1946  Primary Care Physician:Aronson, Rich Champ, MD  Referring Physician: Suan Elm, MD [address] [place],  [place] [address]  Chief complaint:   Patient being seen for shortness of breath  HPI:  Continues to be short of breath with activity  Usually when she is walking is when she feels short of breath She does participate in a lot of water activities, water walking and therapy and water  Being in the gym trying to use exercise machines does bother her back Walking for any length of time as well also does bother her back She has had chronic back problems most of her life  She does have albuterol  to use but has not noticed significant changes with using albuterol   Occasional cough  History of hypertension, cardiac rhythm problems, hypercholesterolemia, atrial fibrillation  Past history of obstructive sleep apnea -Moderate obstructive sleep apnea -Use CPAP for few years  Never smoker, was exposed to secondhand smoke  Retired Comptroller No pertinent family history  Outpatient Encounter Medications as of 03/13/2024  Medication Sig   albuterol  (VENTOLIN  HFA) 108 (90 Base) MCG/ACT inhaler Inhale 2 puffs into the lungs every 6 (six) hours as needed for wheezing or shortness of breath.   aliskiren (TEKTURNA) 300 MG tablet Take 300 mg by mouth daily.   amLODipine (NORVASC) 10 MG tablet Take 10 mg by mouth daily.   ascorbic acid (VITAMIN C) 500 MG tablet Take 500 mg by mouth daily.   buPROPion  (WELLBUTRIN  SR) 150 MG 12 hr tablet Take 1 tablet (150 mg total) by mouth 2 (two) times daily.   Collagen-Boron-Hyaluronic Acid (MOVE FREE ULTRA JOINT HEALTH PO) Take 1 tablet by mouth daily.   Docusate Sodium (COLACE PO) Take by mouth.   ELIQUIS  5 MG TABS tablet TAKE 1 TABLET(5 MG) BY MOUTH TWICE DAILY   fluticasone  (FLONASE ) 50 MCG/ACT nasal spray Place 1 spray into both nostrils daily.   leflunomide (ARAVA) 20 MG tablet  Take 20 mg by mouth daily.   Multiple Vitamins-Minerals (CENTRUM SILVER PO) Take 1 tablet by mouth daily.   Nebivolol  HCl 20 MG TABS TAKE 1 TABLET(20 MG) BY MOUTH DAILY   omeprazole  (PRILOSEC) 20 MG capsule Take 1 capsule (20 mg total) by mouth 2 (two) times daily before a meal.   Polyethylene Glycol 3350 (MIRALAX PO) Take by mouth daily.    simvastatin (ZOCOR) 40 MG tablet Take 40 mg by mouth every evening.   spironolactone  (ALDACTONE ) 25 MG tablet TAKE 1 TABLET(25 MG) BY MOUTH EVERY MORNING   topiramate  (TOPAMAX ) 100 MG tablet Take 1 tablet (100 mg total) by mouth at bedtime.   No facility-administered encounter medications on file as of 03/13/2024.    Allergies as of 03/13/2024 - Review Complete 03/13/2024  Allergen Reaction Noted   Plaquenil [hydroxychloroquine]  08/22/2023   Triple antibiotic [bacitracin-neomycin-polymyxin] Rash 10/17/2013    Past Medical History:  Diagnosis Date   Anxiety    Anxiety    Arthritis    Back pain    Constipation    Essential hypertension    Hyperlipidemia    Joint pain    Lightheadedness 03/17/2022   Lower back pain    Osteoarthritis    Other fatigue    Paroxysmal atrial fibrillation (HCC)    PONV (postoperative nausea and vomiting)    severe-also can get agitated waking up   Shortness of breath on exertion    Sleep apnea  Wears glasses     Past Surgical History:  Procedure Laterality Date   BUNIONECTOMY     CARPAL TUNNEL RELEASE Left 2007   CARPAL TUNNEL RELEASE Right 1992   CARPOMETACARPAL (CMC) FUSION OF THUMB Right 11/26/2013   Procedure: CARPOMETACARPAL (CMC) FUSION OF THUMB;  Surgeon: Amelie Baize., MD;  Location: [facility];  Service: Orthopedics;  Laterality: Right;   COLONOSCOPY     FOOT SURGERY Left 2005   bunionectomy-osteotomy   HAMMER TOE SURGERY      Family History  Problem Relation Age of Onset   Leukemia Father    Colon cancer Father    Diabetes Father    Prostate cancer Father     Hypertension Father    Heart disease Father    Obesity Father    Breast cancer Mother 38   Hypertension Mother    Anxiety disorder Mother    Cancer Mother    Hyperlipidemia Other    COPD Other    Cancer Other    Obesity Other     Social History   Socioeconomic History   Marital status: Divorced    Spouse name: Not on file   Number of children: 1   Years of education: Not on file   Highest education level: Not on file  Occupational History   Occupation: retired  Tobacco Use   Smoking status: Never   Smokeless tobacco: Never  Vaping Use   Vaping status: Never Used  Substance and Sexual Activity   Alcohol  use: No    Alcohol /week: 0.0 standard drinks of alcohol    Drug use: No   Sexual activity: Not Currently    Partners: Male    Birth control/protection: Post-menopausal  Other Topics Concern   Not on file  Social History Narrative   Not on file   Social Drivers of Health   Financial Resource Strain: Low Risk  (03/17/2022)   Overall Financial Resource Strain (CARDIA)    Difficulty of Paying Living Expenses: Not hard at all  Food Insecurity: No Food Insecurity (03/17/2022)   Hunger Vital Sign    Worried About Running Out of Food in the Last Year: Never true    Ran Out of Food in the Last Year: Never true  Transportation Needs: No Transportation Needs (03/17/2022)   PRAPARE - Administrator, Civil Service (Medical): No    Lack of Transportation (Non-Medical): No  Physical Activity: Sufficiently Active (03/17/2022)   Exercise Vital Sign    Days of Exercise per Week: 5 days    Minutes of Exercise per Session: 120 min  Stress: Not on file  Social Connections: Not on file  Intimate Partner Violence: Not on file   Review of Systems  Respiratory:  Positive for cough and shortness of breath.    Vitals:   03/13/24 1528  BP: 133/74  Pulse: (!) 58  SpO2: 91%   Physical Exam Constitutional:      Appearance: She is obese.  HENT:     Head: Normocephalic.      Mouth/Throat:     Mouth: Mucous membranes are moist.     Pharynx: Oropharynx is clear.  Eyes:     General: No scleral icterus.    Pupils: Pupils are equal, round, and reactive to light.  Cardiovascular:     Rate and Rhythm: Normal rate and regular rhythm.     Heart sounds: No murmur heard.    No friction rub.  Pulmonary:     Effort:  No respiratory distress.     Breath sounds: No stridor. No wheezing or rhonchi.  Musculoskeletal:     Cervical back: No rigidity or tenderness.  Neurological:     Mental Status: She is alert.  Psychiatric:        Mood and Affect: Mood normal.   Data Reviewed: Echocardiogram July 2024 reviewed showing diastolic dysfunction  Previous sleep study showing moderate obstructive sleep apnea  Pulmonary function test showing no significant obstruction, no restriction, normal diffusing capacity January 2025-reviewed with the patient  Sleep study December 2024 did reveal moderate obstructive sleep apnea  Assessment:  Obstructive sleep apnea - Does not feel she will be able to tolerate CPAP therapy  Shortness of breath on exertion - This is multifactorial - A big factor may be chronic back pain which may be contributing to overall deconditioning over time - Breathing study is not significant for significant obstructive or restrictive disease  Class II obesity  Does have diastolic dysfunction on echo Mitral valve prolapse   Plan/Recommendations: Use albuterol  as needed  Graded activity as tolerated  Consider inspiratory muscle training-may help with some shortness of breath  Follow-up in about 6 months  Encouraged to call with significant concerns  Myer Artis MD [facility] 03/13/2024, 3:53 PM  CC: Suan Elm, MD

## 2024-03-18 ENCOUNTER — Ambulatory Visit (INDEPENDENT_AMBULATORY_CARE_PROVIDER_SITE_OTHER): Admitting: Family Medicine

## 2024-03-18 ENCOUNTER — Encounter (INDEPENDENT_AMBULATORY_CARE_PROVIDER_SITE_OTHER): Payer: Self-pay | Admitting: Family Medicine

## 2024-03-18 VITALS — BP 125/73 | HR 53 | Temp 97.9°F | Ht 62.0 in | Wt 184.0 lb

## 2024-03-18 DIAGNOSIS — E669 Obesity, unspecified: Secondary | ICD-10-CM

## 2024-03-18 DIAGNOSIS — Z6833 Body mass index (BMI) 33.0-33.9, adult: Secondary | ICD-10-CM | POA: Diagnosis not present

## 2024-03-18 DIAGNOSIS — F3289 Other specified depressive episodes: Secondary | ICD-10-CM

## 2024-03-18 DIAGNOSIS — F5089 Other specified eating disorder: Secondary | ICD-10-CM | POA: Diagnosis not present

## 2024-03-18 MED ORDER — TOPIRAMATE 100 MG PO TABS: 100.0000 mg | ORAL_TABLET | Freq: Every day | ORAL | 0 refills | Status: DC

## 2024-03-18 MED ORDER — BUPROPION HCL ER (SR) 150 MG PO TB12: 150.0000 mg | ORAL_TABLET | Freq: Two times a day (BID) | ORAL | 0 refills | Status: DC

## 2024-03-18 NOTE — Progress Notes (Signed)
 Office: [phone]  /  Fax: [phone]  WEIGHT SUMMARY AND BIOMETRICS  Anthropometric Measurements Height: 5' 2 (1.575 m) Weight: 184 lb (83.5 kg) BMI (Calculated): 33.65 Weight at Last Visit: 191 lb Weight Lost Since Last Visit: 7 lb Weight Gained Since Last Visit: 0 Starting Weight: 198 lb Total Weight Loss (lbs): 14 lb (6.35 kg) Peak Weight: 225 lb   Body Composition  Body Fat %: 47.9 % Fat Mass (lbs): 88.2 lbs Muscle Mass (lbs): 91.2 lbs Total Body Water (lbs): 70.4 lbs Visceral Fat Rating : 15   Other Clinical Data Fasting: no Labs: no Today's Visit #: 47 Starting Date: 10/08/20    Chief Complaint: OBESITY    History of Present Illness Felicia Franklin is a 78 year old female who presents for obesity treatment plan discussion.  She has been following a structured low-carb diet for about three weeks, adhering to it 90% of the time. The diet has been challenging due to cravings for bread and bagels. Despite these challenges, she has lost seven pounds in the last month.  She engages in regular physical activity, including water classes, water walking, and modern yoga five times a week for one to two hours per session. This exercise routine is a significant part of her lifestyle.  Her social circle, particularly her 'water yoga lunch bunch,' has been supportive, although they faced challenges finding suitable dining options that align with her dietary restrictions. She recounts an incident at [employer] where she had to refuse fries, which her friend ended up eating.  She wants more dietary flexibility, mentioning a craving for pizza and discussing potential meal plans that allow for more variety while still managing her weight. She is not a fan of fish and finds cooking it at home challenging due to the lack of ventilation.  Her current medications include bupropion  and topiramate . She reports no significant side effects from these medications,  although she is uncertain about the efficacy of topiramate .      PHYSICAL EXAM:  Blood pressure 125/73, pulse (!) 53, temperature 97.9 F (36.6 C), height 5' 2 (1.575 m), weight 184 lb (83.5 kg), last menstrual period 10/03/1998, SpO2 95%. Body mass index is 33.65 kg/m.  DIAGNOSTIC DATA REVIEWED:  BMET    Component Value Date/Time   NA 137 07/28/2023 1414   K 4.4 07/28/2023 1414   CL 101 07/28/2023 1414   CO2 21 07/28/2023 1414   GLUCOSE 97 07/28/2023 1414   GLUCOSE 91 11/21/2013 1310   BUN 24 07/28/2023 1414   CREATININE 1.01 (H) 07/28/2023 1414   CALCIUM 10.0 07/28/2023 1414   GFRNONAA 71 10/08/2020 0923   GFRAA 82 10/08/2020 0923   Lab Results  Component Value Date   HGBA1C 5.2 04/18/2022   HGBA1C 5.6 10/08/2020   Lab Results  Component Value Date   INSULIN  10.5 04/18/2022   INSULIN  23.3 10/08/2020   Lab Results  Component Value Date   TSH 2.190 04/18/2022   CBC    Component Value Date/Time   WBC 6.0 07/28/2023 1414   RBC 4.43 07/28/2023 1414   HGB 13.3 07/28/2023 1414   HCT 41.1 07/28/2023 1414   PLT 268 07/28/2023 1414   MCV 93 07/28/2023 1414   MCH 30.0 07/28/2023 1414   MCHC 32.4 07/28/2023 1414   RDW 13.3 07/28/2023 1414   Iron Studies No results found for: IRON, TIBC, FERRITIN, IRONPCTSAT Lipid Panel     Component Value Date/Time   CHOL 166 04/18/2022 0846  TRIG 93 04/18/2022 0846   HDL 61 04/18/2022 0846   LDLCALC 88 04/18/2022 0846   Hepatic Function Panel     Component Value Date/Time   PROT 7.2 04/18/2022 0846   ALBUMIN 4.3 04/18/2022 0846   AST 20 04/18/2022 0846   ALT 17 04/18/2022 0846   ALKPHOS 109 04/18/2022 0846   BILITOT 0.7 04/18/2022 0846      Component Value Date/Time   TSH 2.190 04/18/2022 0846   Nutritional Lab Results  Component Value Date   VD25OH 66.9 04/18/2022   VD25OH 52.1 07/12/2021   VD25OH 66.0 10/08/2020     Assessment and Plan Assessment & Plan Obesity She has adhered to a  structured low-carb diet for three weeks with 90% compliance, resulting in a seven-pound weight loss over the past month. She engages in regular physical activity, including water classes, water walking, and modern yoga five times weekly. She struggles with carbohydrate cravings, particularly for bread and pizza. Discussed the role of carbohydrates in neurotransmitter regulation and potential cognitive impacts. She is considering dietary plans and has chosen a calorie-budgeting plan due to her dislike of fish and its flexibility. The plan involves tracking intake like a financial budget, with a daily intake of 1300 calories and at least 75 grams of protein, emphasizing not going below 1100 calories to prevent metabolic slowdown. - Continue structured low-carb diet as needed for short-term weight management. - Initiate calorie-budgeting plan with a daily intake of 1300 calories and at least 75 grams of protein. - Provide eating out handout for nutritional guidance at restaurants. - Recommend using My Fitness Pal app for tracking calories and protein intake. - Educate on using Chat GPT for meal planning and recipe ideas. - Consider discontinuing topiramate  if not beneficial and monitor effects.  Emotional Eating Behaviors She exhibits emotional eating behaviors, particularly in social settings where she struggles to resist high-carbohydrate foods. Emphasized understanding these behaviors and using structured plans to manage them. She is aware of her cravings and is working on strategies to manage them, including structured meal plans and tracking intake. - Continue bupropion  to support weight management and control emotional eating. - Consider discontinuing topiramate  if ineffective, with the option to restart if needed.  Follow-up She is scheduled for a follow-up appointment in July and advised to schedule an additional appointment in August due to booking constraints. - Schedule follow-up appointment  in August. - Monitor progress with new dietary plan and medication adjustments.      She was informed of the importance of frequent follow up visits to maximize her success with intensive lifestyle modifications for her multiple health conditions.    Jasmine Mesi, MD

## 2024-04-01 DIAGNOSIS — Z6836 Body mass index (BMI) 36.0-36.9, adult: Secondary | ICD-10-CM | POA: Diagnosis not present

## 2024-04-01 DIAGNOSIS — M059 Rheumatoid arthritis with rheumatoid factor, unspecified: Secondary | ICD-10-CM | POA: Diagnosis not present

## 2024-04-01 DIAGNOSIS — M6530 Trigger finger, unspecified finger: Secondary | ICD-10-CM | POA: Diagnosis not present

## 2024-04-01 DIAGNOSIS — Z79899 Other long term (current) drug therapy: Secondary | ICD-10-CM | POA: Diagnosis not present

## 2024-04-01 DIAGNOSIS — G4733 Obstructive sleep apnea (adult) (pediatric): Secondary | ICD-10-CM | POA: Diagnosis not present

## 2024-04-01 DIAGNOSIS — E669 Obesity, unspecified: Secondary | ICD-10-CM | POA: Diagnosis not present

## 2024-04-01 DIAGNOSIS — M154 Erosive (osteo)arthritis: Secondary | ICD-10-CM | POA: Diagnosis not present

## 2024-04-06 ENCOUNTER — Other Ambulatory Visit: Payer: Self-pay | Admitting: Cardiovascular Disease

## 2024-04-06 DIAGNOSIS — I480 Paroxysmal atrial fibrillation: Secondary | ICD-10-CM

## 2024-04-08 NOTE — Telephone Encounter (Signed)
 Prescription refill request for Eliquis  received. Indication: PAF Last office visit: 07/28/23  JAYSON Finder NP Scr: 1.01 on 07/28/23  Epic Age: 78 Weight: 87kg  Based on above findings Eliquis  5mg  twice daily is the appropriate dose.  Refill approved.

## 2024-04-16 ENCOUNTER — Ambulatory Visit (INDEPENDENT_AMBULATORY_CARE_PROVIDER_SITE_OTHER): Admitting: Family Medicine

## 2024-04-16 ENCOUNTER — Encounter (INDEPENDENT_AMBULATORY_CARE_PROVIDER_SITE_OTHER): Payer: Self-pay | Admitting: Family Medicine

## 2024-04-16 VITALS — BP 128/72 | HR 59 | Temp 97.9°F | Ht 62.0 in | Wt 184.0 lb

## 2024-04-16 DIAGNOSIS — R7303 Prediabetes: Secondary | ICD-10-CM | POA: Diagnosis not present

## 2024-04-16 DIAGNOSIS — R7989 Other specified abnormal findings of blood chemistry: Secondary | ICD-10-CM | POA: Diagnosis not present

## 2024-04-16 DIAGNOSIS — F5089 Other specified eating disorder: Secondary | ICD-10-CM | POA: Diagnosis not present

## 2024-04-16 DIAGNOSIS — Z6833 Body mass index (BMI) 33.0-33.9, adult: Secondary | ICD-10-CM

## 2024-04-16 DIAGNOSIS — M6530 Trigger finger, unspecified finger: Secondary | ICD-10-CM

## 2024-04-16 DIAGNOSIS — E669 Obesity, unspecified: Secondary | ICD-10-CM | POA: Diagnosis not present

## 2024-04-16 NOTE — Progress Notes (Signed)
 Office: [phone]  /  Fax: [phone]  WEIGHT SUMMARY AND BIOMETRICS  Anthropometric Measurements Height: 5' 2 (1.575 m) Weight: 184 lb (83.5 kg) BMI (Calculated): 33.65 Weight at Last Visit: 184 lb Weight Lost Since Last Visit: 0 Weight Gained Since Last Visit: 0 Starting Weight: 198 lb Total Weight Loss (lbs): 14 lb (6.35 kg) Peak Weight: 225 lb   Body Composition  Body Fat %: 48.3 % Fat Mass (lbs): 88.8 lbs Muscle Mass (lbs): 90.4 lbs Total Body Water (lbs): 70.8 lbs Visceral Fat Rating : 15   Other Clinical Data Fasting: no Labs: no Today's Visit #: 48 Starting Date: 10/08/20    Chief Complaint: OBESITY    History of Present Illness Felicia Franklin is a 78 year old female who presents for obesity treatment and progress assessment.  She has been following a dietary plan with a goal of 1300 calories and 75 or more grams of protein, achieving this about 60% of the time. Despite engaging in water exercises for one to two hours, six times a week, her weight has remained stable over the last month. Her protein intake has decreased while carbohydrate intake has increased, and she finds it challenging to measure protein when mixed with other foods, especially when she is not preparing the meals herself.  She has a history of prediabetes and is working on improving this condition by reducing simple carbohydrates and exercising regularly. She is not currently taking metformin .  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 She often cooks for herself but finds it challenging due to the kitchen setup.  She has a history of elevated creatinine levels and reports that it was noted on recent labs; she spends time in the pool in the mornings and acknowledges that she may not be hydrating enough, especially in hot weather. She plans to discuss  this with her cardiologist next Monday.  She sleeps about seven hours most nights but experiences frequent awakenings to use the bathroom, typically two to three times per night. She uses a CPAP machine for sleep apnea, which she feels interrupts her sleep.  She has a trigger finger in her hand, causing pain and tingling, which she did not experience with previous episodes of trigger finger. She is concerned about this new symptom but is currently focusing on her upcoming cataract surgery scheduled for August 18th and September 8th.  She is currently taking topiramate  for eating behaviors but feels it is not beneficial.      PHYSICAL EXAM:  Blood pressure 128/72, pulse (!) 59, temperature 97.9 F (36.6 C), height 5' 2 (1.575 m), weight 184 lb (83.5 kg), last menstrual period 10/03/1998, SpO2 95%. Body mass index is 33.65 kg/m.  DIAGNOSTIC DATA REVIEWED:  BMET    Component Value Date/Time   NA 137 07/28/2023 1414   K 4.4 07/28/2023 1414   CL 101 07/28/2023 1414   CO2 21 07/28/2023 1414   GLUCOSE 97 07/28/2023 1414   GLUCOSE 91 11/21/2013 1310   BUN 24 07/28/2023 1414   CREATININE 1.01 (H) 07/28/2023 1414   CALCIUM 10.0 07/28/2023 1414   GFRNONAA 71 10/08/2020 0923   GFRAA 82 10/08/2020 0923   Lab Results  Component Value Date   HGBA1C 5.2 04/18/2022   HGBA1C 5.6 10/08/2020   Lab Results  Component Value Date   INSULIN  10.5 04/18/2022   INSULIN  23.3 10/08/2020  Lab Results  Component Value Date   TSH 2.190 04/18/2022   CBC    Component Value Date/Time   WBC 6.0 07/28/2023 1414   RBC 4.43 07/28/2023 1414   HGB 13.3 07/28/2023 1414   HCT 41.1 07/28/2023 1414   PLT 268 07/28/2023 1414   MCV 93 07/28/2023 1414   MCH 30.0 07/28/2023 1414   MCHC 32.4 07/28/2023 1414   RDW 13.3 07/28/2023 1414   Iron Studies No results found for: IRON, TIBC, FERRITIN, IRONPCTSAT Lipid Panel     Component Value Date/Time   CHOL 166 04/18/2022 0846   TRIG 93  04/18/2022 0846   HDL 61 04/18/2022 0846   LDLCALC 88 04/18/2022 0846   Hepatic Function Panel     Component Value Date/Time   PROT 7.2 04/18/2022 0846   ALBUMIN 4.3 04/18/2022 0846   AST 20 04/18/2022 0846   ALT 17 04/18/2022 0846   ALKPHOS 109 04/18/2022 0846   BILITOT 0.7 04/18/2022 0846      Component Value Date/Time   TSH 2.190 04/18/2022 0846   Nutritional Lab Results  Component Value Date   VD25OH 66.9 04/18/2022   VD25OH 52.1 07/12/2021   VD25OH 66.0 10/08/2020     Assessment and Plan Assessment & Plan Obesity She follows a calorie-restricted diet of 1300 calories with a goal of 75 grams of protein daily, achieving these targets approximately 60% of the time. Engages in water exercises for one to two hours, six times a week. Despite these efforts, her weight remains unchanged over the last month. She acknowledges challenges in maintaining protein intake, especially when not cooking for herself, and is considering more careful measurement of protein intake. Discussed potential for rebound indulging after a lower carb plan and the importance of consistency in dietary habits. - Continue current diet plan of 1300 calories and 75 grams of protein daily - Encourage more diligent measurement of protein intake - Continue water exercises for one to two hours, six times a week  Emotional Eating Behaviors Pt would like to try to stop her Topamax  as she is not certain she needs it anymore -D/C Topamax  and will follow up in 1 month to see how she is doing.  Prediabetes She is managing prediabetes through diet and exercise, focusing on reducing simple carbohydrates and maintaining regular physical activity. She is not on metformin , relying on lifestyle modifications. - Continue monitoring blood glucose levels - Encourage reduction of simple carbohydrates in diet - Continue regular exercise regimen  Elevated Creatinine She presents with elevated creatinine, potentially related  to spironolactone  or dehydration. Scheduled to consult a cardiologist next Monday. Emphasized the importance of hydration, especially post-morning pool exercises, as dehydration can affect creatinine levels and lab results. - Discuss elevated creatinine with cardiologist - Encourage adequate hydration, especially after pool exercises  Trigger Finger vs. Dupuytren's Contracture Reports trigger finger in her left hand with new tingling symptoms. 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 Advised to prioritize upcoming cataract surgery and address this issue afterward. Discussed potential interventions, including surgery and injections, if condition progresses. - Monitor symptoms of trigger finger and tingling - Reassess after cataract surgery to determine if intervention is needed   Follow-up Follow-up appointment scheduled for August 11th. Recommended scheduling a September appointment due to booking constraints. - Attend follow-up appointment on August 11th - Schedule follow-up appointment for September    She was informed of the importance of frequent follow up visits to maximize her success with intensive lifestyle modifications for  her multiple health conditions.    Louann Penton, MD

## 2024-04-19 ENCOUNTER — Encounter (HOSPITAL_BASED_OUTPATIENT_CLINIC_OR_DEPARTMENT_OTHER): Payer: Self-pay

## 2024-04-22 ENCOUNTER — Ambulatory Visit (HOSPITAL_BASED_OUTPATIENT_CLINIC_OR_DEPARTMENT_OTHER): Admitting: Family

## 2024-04-22 ENCOUNTER — Encounter (HOSPITAL_BASED_OUTPATIENT_CLINIC_OR_DEPARTMENT_OTHER): Payer: Self-pay | Admitting: Family

## 2024-04-22 VITALS — BP 128/72 | HR 66 | Ht 62.0 in | Wt 193.0 lb

## 2024-04-22 DIAGNOSIS — D6859 Other primary thrombophilia: Secondary | ICD-10-CM | POA: Diagnosis not present

## 2024-04-22 DIAGNOSIS — G4733 Obstructive sleep apnea (adult) (pediatric): Secondary | ICD-10-CM | POA: Diagnosis not present

## 2024-04-22 DIAGNOSIS — I480 Paroxysmal atrial fibrillation: Secondary | ICD-10-CM | POA: Diagnosis not present

## 2024-04-22 DIAGNOSIS — E782 Mixed hyperlipidemia: Secondary | ICD-10-CM | POA: Diagnosis not present

## 2024-04-22 DIAGNOSIS — I10 Essential (primary) hypertension: Secondary | ICD-10-CM | POA: Diagnosis not present

## 2024-04-22 NOTE — Progress Notes (Unsigned)
 Cardiology Office Note:  .   Date:  04/23/2024  ID:  Felicia Franklin, DOB 13-Apr-1946, MRN [id number] PCP: Shepard Ade, MD  [facility] Providers Cardiologist:  Annabella Scarce, MD    History of Present Illness: .   Felicia Franklin is a 78 y.o. female hx of hypertension, hyperlipidemia, paroxysmal atrial fibrillation, arthritis, sleep apnea, anxiety.   Diagnosed with atrial fibrillation in 2014.  Initially evaluated 07/2021 with dyspnea with echocardiogram revealing LVEF 63%.  Nuclear stress test 09/2021 LVEF 67%, no ischemia.   At visit 03/2022 noted occasional episode of lightheadedness recommended for ambulatory monitor if increased symptoms. Seen 10/21/22 with BP mildly elevated recommended for home monitoring. She had no recurrent lightheadedness though a week later noted recurrent episodes and ambulatory monitor ordered. Monitor with 21% atrial fibrillation burden which was rate controlled.  At follow-up 11/22/2022 for optimal rate control and improvement in blood pressure Bystolic  was increased to 20 mg daily.  Due to dyspnea, updated echo 04/28/23 normal LVEF 60-65%, mild MR. Last seen 07/28/23 doing well from a cardiac perspective.   Presents today for follow up.  Continues to exercise water aerobics at [facility] which she enjoys. Reports no shortness of breath at rest and mild but stable dyspnea on exertion. Reports no chest pain, pressure, or tightness. No edema, orthopnea, PND. Reports no palpitations.  She notes persistent difficulties with weight loss despite exercise and dietary modifications through [employer] [facility]. She had labs with outside provider 04/02/24 creatinine 1.61, GFR 33, Hb 12.8. creatinine higher than her baseline and notes she may not be hydrating well. Mounjaro  previously discontinued 04/2022 due to cost concerns and as she was prediabetic but not diabetic, reviewed possible coverage of Zepbound  for sleep apnea.    ROS: Please see the history of  present illness.    All other systems reviewed and are negative.   Studies Reviewed: SABRA   EKG Interpretation Date/Time:  Monday April 22 2024 15:20:17 EDT Ventricular Rate:  68 PR Interval:  172 QRS Duration:  70 QT Interval:  406 QTC Calculation: 431 R Axis:   -2  Text Interpretation: Normal sinus rhythm Low voltage QRS No acute changes Confirmed by Felicia Franklin ([id number]) on 04/23/2024 4:32:55 PM    Cardiac Studies & Procedures   ______________________________________________________________________________________________   STRESS TESTS  PCV MYOCARDIAL PERFUSION WITH LEXISCAN  09/22/2021  Interpretation Summary   The study is low risk.   Left ventricular function is normal. End diastolic cavity size is normal. End systolic cavity size is normal.  Lexiscan  (with Mod Bruce protocol) Nuclear stress test 09/22/2021: Nondiagnostic ECG stress with Lexiscan . Prominent breast tissue attenuation in the inferior wall. There is no ischemia or scar. Overall LV systolic function is normal without regional wall motion abnormalities. Stress LV EF: 67%. No change from 03/09/2013. Low risk study.   ECHOCARDIOGRAM  ECHOCARDIOGRAM COMPLETE 04/28/2023  Narrative ECHOCARDIOGRAM REPORT    Patient Name:   Felicia Franklin Date of Exam: 04/28/2023 Medical Rec #:  [id number]       Height:       62.0 in Accession #:    [id number]      Weight:       185.0 lb Date of Birth:  04-30-1946       BSA:          1.849 m Patient Age:    77 years        BP:           122/68 mmHg  Patient Gender: F               HR:           63 bpm. Exam Location:  Outpatient  Procedure: 2D Echo, Cardiac Doppler, Color Doppler and 3D Echo  Indications:    R06.9 DOE; R60.0 Lower extremity edema  History:        Patient has prior history of Echocardiogram examinations, most recent 08/02/2021. Arrythmias:Atrial Fibrillation, Signs/Symptoms:Dyspnea and Edema; Risk Factors:Hypertension, Sleep Apnea, Dyslipidemia and  Non-Smoker. Patient denies chest pain. She does have DOE and leg edema.  Sonographer:    Annabella Cater RVT, RDCS (AE), RDMS Referring Phys: [phone] RECHE GORMAN FINDER   Sonographer Comments: Patient is obese. Image acquisition challenging due to patient body habitus. Global longitudinal strain was attempted. IMPRESSIONS   1. Left ventricular ejection fraction, by estimation, is 60 to 65%. The left ventricle has normal function. The left ventricle has no regional wall motion abnormalities. Left ventricular diastolic parameters are consistent with Grade I diastolic dysfunction (impaired relaxation). 2. Right ventricular systolic function is normal. The right ventricular size is normal. 3. Left atrial size was moderately dilated. 4. The mitral valve is normal in structure. Mild mitral valve regurgitation. No evidence of mitral stenosis. 5. The aortic valve is tricuspid. Aortic valve regurgitation is not visualized. Aortic valve sclerosis is present, with no evidence of aortic valve stenosis. 6. The inferior vena cava is normal in size with greater than 50% respiratory variability, suggesting right atrial pressure of 3 mmHg.  Comparison(s): No prior Echocardiogram. EF 63%.  FINDINGS Left Ventricle: Left ventricular ejection fraction, by estimation, is 60 to 65%. The left ventricle has normal function. The left ventricle has no regional wall motion abnormalities. The left ventricular internal cavity size was normal in size. There is no left ventricular hypertrophy. Left ventricular diastolic parameters are consistent with Grade I diastolic dysfunction (impaired relaxation).  Right Ventricle: The right ventricular size is normal. Right ventricular systolic function is normal.  Left Atrium: Left atrial size was moderately dilated.  Right Atrium: Right atrial size was normal in size.  Pericardium: There is no evidence of pericardial effusion.  Mitral Valve: The mitral valve is normal in  structure. Mild mitral annular calcification. Mild mitral valve regurgitation. No evidence of mitral valve stenosis.  Tricuspid Valve: The tricuspid valve is normal in structure. Tricuspid valve regurgitation is mild . No evidence of tricuspid stenosis.  Aortic Valve: The aortic valve is tricuspid. Aortic valve regurgitation is not visualized. Aortic valve sclerosis is present, with no evidence of aortic valve stenosis. Aortic valve mean gradient measures 4.0 mmHg. Aortic valve peak gradient measures 8.5 mmHg.  Pulmonic Valve: The pulmonic valve was normal in structure. Pulmonic valve regurgitation is trivial. No evidence of pulmonic stenosis.  Aorta: The aortic root is normal in size and structure.  Venous: The inferior vena cava is normal in size with greater than 50% respiratory variability, suggesting right atrial pressure of 3 mmHg.  IAS/Shunts: No atrial level shunt detected by color flow Doppler.   LEFT VENTRICLE PLAX 2D LVIDd:         4.28 cm   Diastology LVIDs:         2.61 cm   LV e' medial:    6.85 cm/s LV PW:         1.07 cm   LV E/e' medial:  13.8 LV IVS:        1.05 cm   LV e' lateral:   7.29 cm/s  LVOT diam:     1.60 cm   LV E/e' lateral: 12.9 LV SV:         56 LV SV Index:   31 LVOT Area:     2.01 cm  3D Volume EF: 3D EF:        72 % LV EDV:       112 ml LV ESV:       32 ml LV SV:        80 ml  RIGHT VENTRICLE RV S prime:     21.60 cm/s TAPSE (M-mode): 3.9 cm  LEFT ATRIUM             Index        RIGHT ATRIUM           Index LA diam:        4.10 cm 2.22 cm/m   RA Area:     14.00 cm LA Vol (A2C):   80.6 ml 43.58 ml/m  RA Volume:   31.90 ml  17.25 ml/m LA Vol (A4C):   83.6 ml 45.20 ml/m LA Biplane Vol: 82.9 ml 44.83 ml/m AORTIC VALVE                    PULMONIC VALVE AV Area (Vmax):    1.49 cm     PV Vmax:          0.92 m/s AV Area (Vmean):   1.49 cm     PV Peak grad:     3.4 mmHg AV Area (VTI):     1.51 cm     PR End Diast Vel: 3.06 msec AV Vmax:            146.00 cm/s AV Vmean:          94.900 cm/s AV VTI:            0.374 m AV Peak Grad:      8.5 mmHg AV Mean Grad:      4.0 mmHg LVOT Vmax:         108.00 cm/s LVOT Vmean:        70.100 cm/s LVOT VTI:          0.281 m LVOT/AV VTI ratio: 0.75  AORTA Ao Root diam: 2.70 cm Ao Asc diam:  2.90 cm Ao Arch diam: 3.2 cm  MITRAL VALVE               TRICUSPID VALVE MV Area (PHT): 4.60 cm    TR Peak grad:   30.2 mmHg MV Decel Time: 165 msec    TR Vmax:        275.00 cm/s MV E velocity: 94.40 cm/s MV A velocity: 89.50 cm/s  SHUNTS MV E/A ratio:  1.05        Systemic VTI:  0.28 m Systemic Diam: 1.60 cm  Redell Shallow MD Electronically signed by Redell Shallow MD Signature Date/Time: 04/28/2023/3:10:42 PM    Final    MONITORS  CARDIAC EVENT MONITOR 11/21/2022  Narrative 14 Day Event Monitor  Quality: Fair.  Baseline artifact. Predominant rhythm: Sinus rhythm.  21% atrial fibrillation. Average heart rate: 65 bpm Max heart rate: 111 bpm Min heart rate: 46 bpm Pauses >2.5 seconds: none  Rare (<1%) PACs and PVCs  Tiffany C. Raford, MD, [facility] 12/16/2022 10:52 PM   CT SCANS  CT CARDIAC SCORING (SELF PAY ONLY) 04/01/2022  Addendum 04/03/2022  4:50 PM ADDENDUM REPORT: 04/03/2022 16:48  CLINICAL DATA:  Cardiovascular Disease Risk stratification  EXAM: Coronary Calcium Score  TECHNIQUE: A gated, non-contrast computed tomography scan of the heart was performed using 3mm slice thickness. Axial images were analyzed on a dedicated workstation. Calcium scoring of the coronary arteries was performed using the Agatston method.  FINDINGS: Coronary arteries: Normal origins.  Coronary Calcium Score:  Left main:  Left anterior descending artery:  Left circumflex artery:  Right coronary artery:  Total: 0  Percentile:  Pericardium: Normal.  Ascending Aorta: Normal caliber.  Non-cardiac: See separate report from [facility].  IMPRESSION: Coronary  calcium score of 0.  RECOMMENDATIONS: Coronary artery calcium (CAC) score is a strong predictor of incident coronary heart disease (CHD) and provides predictive information beyond traditional risk factors. CAC scoring is reasonable to use in the decision to withhold, postpone, or initiate statin therapy in intermediate-risk or selected borderline-risk asymptomatic adults (age 76-75 years and LDL-C >=70 to <190 mg/dL) who do not have diabetes or established atherosclerotic cardiovascular disease (ASCVD).* In intermediate-risk (10-year ASCVD risk >=7.5% to <20%) adults or selected borderline-risk (10-year ASCVD risk >=5% to <7.5%) adults in whom a CAC score is measured for the purpose of making a treatment decision the following recommendations have been made:  If CAC=0, it is reasonable to withhold statin therapy and reassess in 5 to 10 years, as long as higher risk conditions are absent (diabetes mellitus, family history of premature CHD in first degree relatives (males <55 years; females <65 years), cigarette smoking, or LDL >=190 mg/dL).  If CAC is 1 to 99, it is reasonable to initiate statin therapy for patients >=97 years of age.  If CAC is >=100 or >=75th percentile, it is reasonable to initiate statin therapy at any age.  Cardiology referral should be considered for patients with CAC scores >=400 or >=75th percentile.  *2018 AHA/ACC/AACVPR/AAPA/ABC/ACPM/ADA/AGS/APhA/ASPC/NLA/PCNA Guideline on the Management of Blood Cholesterol: A Report of the American College of Cardiology/American Heart Association Task Force on Clinical Practice Guidelines. J Am Coll Cardiol. 2019;73(24):3168-3209.  Lonni Nanas, MD   Electronically Signed By: Lonni Nanas M.D. On: 04/03/2022 16:48  Narrative EXAM: OVER-READ INTERPRETATION  CT CHEST  The following report is a limited chest CT over-read performed by radiologist Dr. Toribio Aye of [facility]  on 04/01/2022. The coronary calcium score interpretation by the cardiologist is attached.  COMPARISON:  None Available.  FINDINGS: Atherosclerotic calcifications in the thoracic aorta. Small hiatal hernia. Calcifications calcified granulomas throughout the spleen and liver. Calcified granulomas in the right lower lobe with extensive calcified right hilar and subcarinal lymph nodes. Within the visualized portions of the thorax there are no suspicious appearing pulmonary nodules or masses, there is no acute consolidative airspace disease, no pleural effusions, no pneumothorax and no lymphadenopathy. Visualized portions of the upper abdomen are unremarkable. There are no aggressive appearing lytic or blastic lesions noted in the visualized portions of the skeleton.  IMPRESSION: 1. Old granulomatous disease, as above. 2.  Aortic Atherosclerosis (ICD10-I70.0).  Electronically Signed: By: Toribio Aye M.D. On: 04/02/2022 07:10     ______________________________________________________________________________________________      Risk Assessment/Calculations:     CHA2DS2-VASc Score = 4   This indicates a 4.8% annual risk of stroke. The patient's score is based upon: CHF History: 0 HTN History: 1 Diabetes History: 0 Stroke History: 0 Vascular Disease History: 0 Age Score: 2 Gender Score: 1        Physical Exam:   VS:  BP 128/72   Pulse 66   Ht 5' 2 (1.575 m)   Wt  193 lb (87.5 kg)   LMP 10/03/1998   BMI 35.30 kg/m    Wt Readings from Last 3 Encounters:  04/22/24 193 lb (87.5 kg)  04/16/24 184 lb (83.5 kg)  03/18/24 184 lb (83.5 kg)    GEN: Well nourished, overweight, well developed in no acute distress NECK: No JVD; No carotid bruits CARDIAC: RRR, no murmurs, rubs, gallops RESPIRATORY:  Clear to auscultation without rales, wheezing or rhonchi  ABDOMEN: Soft, non-tender, non-distended EXTREMITIES:  No edema; No deformity   ASSESSMENT AND PLAN: .     Exertional dyspnea - 03/2022 coronary calcium score of 0. Echo 04/2022 normal LVEF, mild MR. No acute findings by pulmonology. No indication for further workup. Continue to increase stamina through her regular exercise program.    PAF /hypercoagulable state - Monitor 11/2022 with 21% burden which is overall rate controlled.  Continue Eliquis  5 mg twice daily,  Bystolic  20mg  every day. Denies bleeding complications. She cannot necessarily distinguish when she is atrial fib vs NSR.   Does not meet dose reduction criteria for Eliquis .  Denies bleeding complications. CHA2DS2-VASc Score = 4 [CHF History: 0, HTN History: 1, Diabetes History: 0, Stroke History: 0, Vascular Disease History: 0, Age Score: 2, Gender Score: 1].  Therefore, the patient's annual risk of stroke is 4.8 %.     HTN - BP well controlled. Continue current antihypertensive regimen Amlodipine 10mg  daily, Nebivolol  20mg  daily, Spironolactone  25mg  daily. Earlier this month elevated creatinine 1.61. she will increase hydration. Repeat BMET in 1-2 weeks. If creatinine not improved, plan to reduce Spironolactone  dose.    OSA - Has not tolerated CPAP previously. Has politely declined with pulmonary to re-trial. Submit prior authorization for Zepbound  due to moderate sleep apnea by prior sleep study.   HLD -continue simvastatin 40 mg daily. 10/2023 ldl 99 at goal of less than 100.           Dispo: follow up in 6 months   Signed, Reche GORMAN Finder, NP

## 2024-04-22 NOTE — Patient Instructions (Addendum)
  Medication Instructions:   The office will let you know how much the Zepbound  will cost.   *If you need a refill on your cardiac medications before your next appointment, please call your pharmacy*  Lab Work:  Your physician recommends that you return for lab work in one - two weeks, no fasting at any lab corp. Patient given paperwork today.    If you have labs (blood work) drawn today and your tests are completely normal, you will receive your results only by: MyChart Message (if you have MyChart) OR A paper copy in the mail If you have any lab test that is abnormal or we need to change your treatment, we will call you to review the results.  Testing/Procedures:  None ordered.  Follow-Up: At [facility], you and your health needs are our priority.  As part of our continuing mission to provide you with exceptional heart care, our providers are all part of one team.  This team includes your primary Cardiologist (physician) and Advanced Practice Providers or APPs (Physician Assistants and Nurse Practitioners) who all work together to provide you with the care you need, when you need it.  Your next appointment:   6 month(s)  Provider:   Annabella Scarce, MD or Reche Finder, NP    We recommend signing up for the patient portal called MyChart.  Sign up information is provided on this After Visit Summary.  MyChart is used to connect with patients for Virtual Visits (Telemedicine).  Patients are able to view lab/test results, encounter notes, upcoming appointments, etc.  Non-urgent messages can be sent to your provider as well.   To learn more about what you can do with MyChart, go to [email or web address].   Other Instructions  We are considering a GLP-1 today called Zepbound . Some insurances cover it for sleep apnea.  This is a once weekly injection that helps you lose weight and maintain weight loss.  This medication works best when combined with healthy diet and  regular exercise.  GLP1 medications help you lose weight by reducing appetite and helping you feel fuller longer. Most common side effects include nausea, vomiting, diarrhea. These side effects can be limited by eating slowly and eating small meals and often improve after a few days. We have Sent a prescription and are awaiting prior authorization from insurance.

## 2024-04-23 ENCOUNTER — Other Ambulatory Visit (HOSPITAL_COMMUNITY): Payer: Self-pay

## 2024-04-23 ENCOUNTER — Encounter (HOSPITAL_BASED_OUTPATIENT_CLINIC_OR_DEPARTMENT_OTHER): Payer: Self-pay | Admitting: Family

## 2024-04-23 ENCOUNTER — Telehealth: Payer: Self-pay

## 2024-04-23 NOTE — Telephone Encounter (Signed)
Left message on machine for pt to contact the office.   

## 2024-04-23 NOTE — Telephone Encounter (Signed)
 Pharmacy Patient Advocate Encounter   Received notification from Physician's Office that prior authorization for ZEPBOUND  is required/requested.   Insurance verification completed.   The patient is insured through [employer] .   Per test claim: PA required; PA submitted to above mentioned insurance via CoverMyMeds Key/confirmation #/EOC [id number] Status is pending

## 2024-04-23 NOTE — Telephone Encounter (Signed)
 Pharmacy Patient Advocate Encounter  Received notification from [employer] that Prior Authorization for ZEPBOUND has been APPROVED from 10/04/23 to 10/02/24. Ran test claim, Copay is $0. This test claim was processed through [facility]- copay amounts may vary at other pharmacies due to pharmacy/plan contracts, or as the patient moves through the different stages of their insurance plan.

## 2024-04-23 NOTE — Telephone Encounter (Signed)
-----   Message from [facility] Danielle G sent at 04/22/2024  4:22 PM EDT ----- Please prior auth Zepbound  for Sleep Apnea.  Thanks [employer]

## 2024-04-23 NOTE — Telephone Encounter (Signed)
 When she returns call, recommend: Rx Zepbound  2.5mg  weekly x 4 weeks Will send a MyChart message in 3 weeks to see how she is tolerating, then hopefully increase to 5mg  dose for the second month and continue gradually up-titrating  Okay to follow up in 6 mos as previously recommended. We can monitor weights through her visits with Dr. Larae office.   Bridget Westbrooks S Mee Macdonnell, NP

## 2024-04-24 ENCOUNTER — Other Ambulatory Visit (HOSPITAL_BASED_OUTPATIENT_CLINIC_OR_DEPARTMENT_OTHER): Payer: Self-pay

## 2024-04-24 ENCOUNTER — Telehealth (HOSPITAL_BASED_OUTPATIENT_CLINIC_OR_DEPARTMENT_OTHER): Payer: Self-pay | Admitting: Family

## 2024-04-24 ENCOUNTER — Telehealth (INDEPENDENT_AMBULATORY_CARE_PROVIDER_SITE_OTHER): Payer: Self-pay | Admitting: Family Medicine

## 2024-04-24 MED ORDER — ZEPBOUND 2.5 MG/0.5ML ~~LOC~~ SOAJ
2.5000 mg | SUBCUTANEOUS | 0 refills | Status: DC
  Filled 2024-04-24: qty 2, 28d supply, fill #0

## 2024-04-24 MED ORDER — ZEPBOUND 2.5 MG/0.5ML ~~LOC~~ SOAJ: 2.5000 mg | SUBCUTANEOUS | 0 refills | Status: DC

## 2024-04-24 NOTE — Telephone Encounter (Signed)
 When she returns call, recommend: Rx Zepbound  2.5mg  weekly x 4 weeks Will send a MyChart message in 3 weeks to see how she is tolerating, then hopefully increase to 5mg  dose for the second month and continue gradually up-titrating   Okay to follow up in 6 mos as previously recommended. We can monitor weights through her visits with Dr. Larae office.    Reche GORMAN Finder, NP   Advised patient, verbalized understanding

## 2024-04-24 NOTE — Telephone Encounter (Signed)
 Pt c/o medication issue:  1. Name of Medication: Zepbound   2. How are you currently taking this medication (dosage and times per day)?   3. Are you having a reaction (difficulty breathing--STAT)?   4. What is your medication issue? Patient said she is supposed to let Edsel know whether she wants Zepbound  to be called in. She does want it called in to [facility] on [address], [place] please

## 2024-04-24 NOTE — Telephone Encounter (Signed)
 Pt called stating that she visited her cardiologist. They found a way to prescribe Zepbound . Pt wants to know if Dr. Verdon would be OK with patient receiving the medication from her cardiologist. Please call the patient with an answer. Pt wants to speak with someone.

## 2024-04-24 NOTE — Telephone Encounter (Signed)
 Called and spoke with patient. Per Dr. Verdon  she is ok with patient starting Zepbound , however she would like to see her in the office before the dosage is increased. Patient verbalized understanding and has 2 appointments already scheduled with Dr. Verdon. She is currently scheduled for 8/11 and 9/15. She is not sure of the date she may start the medication as she has cataract surgery scheduled for 8/18.

## 2024-04-26 DIAGNOSIS — H04123 Dry eye syndrome of bilateral lacrimal glands: Secondary | ICD-10-CM | POA: Diagnosis not present

## 2024-05-01 DIAGNOSIS — G4733 Obstructive sleep apnea (adult) (pediatric): Secondary | ICD-10-CM | POA: Diagnosis not present

## 2024-05-08 DIAGNOSIS — I480 Paroxysmal atrial fibrillation: Secondary | ICD-10-CM | POA: Diagnosis not present

## 2024-05-08 DIAGNOSIS — E782 Mixed hyperlipidemia: Secondary | ICD-10-CM | POA: Diagnosis not present

## 2024-05-08 DIAGNOSIS — D6859 Other primary thrombophilia: Secondary | ICD-10-CM | POA: Diagnosis not present

## 2024-05-08 DIAGNOSIS — G4733 Obstructive sleep apnea (adult) (pediatric): Secondary | ICD-10-CM | POA: Diagnosis not present

## 2024-05-08 DIAGNOSIS — I10 Essential (primary) hypertension: Secondary | ICD-10-CM | POA: Diagnosis not present

## 2024-05-09 ENCOUNTER — Ambulatory Visit (HOSPITAL_BASED_OUTPATIENT_CLINIC_OR_DEPARTMENT_OTHER): Payer: Self-pay | Admitting: Family

## 2024-05-09 LAB — BASIC METABOLIC PANEL WITH GFR
BUN/Creatinine Ratio: 20 (ref 12–28)
BUN: 17 mg/dL (ref 8–27)
CO2: 17 mmol/L — ABNORMAL LOW (ref 20–29)
Calcium: 9.7 mg/dL (ref 8.7–10.3)
Chloride: 105 mmol/L (ref 96–106)
Creatinine, Ser: 0.83 mg/dL (ref 0.57–1.00)
Glucose: 100 mg/dL — ABNORMAL HIGH (ref 70–99)
Potassium: 4.5 mmol/L (ref 3.5–5.2)
Sodium: 141 mmol/L (ref 134–144)
eGFR: 73 mL/min/1.73 (ref >59)

## 2024-05-13 ENCOUNTER — Encounter (INDEPENDENT_AMBULATORY_CARE_PROVIDER_SITE_OTHER): Payer: Self-pay | Admitting: Family Medicine

## 2024-05-13 ENCOUNTER — Ambulatory Visit (INDEPENDENT_AMBULATORY_CARE_PROVIDER_SITE_OTHER): Admitting: Family Medicine

## 2024-05-13 VITALS — BP 131/74 | HR 52 | Temp 98.4°F | Ht 62.0 in | Wt 188.0 lb

## 2024-05-13 DIAGNOSIS — F5089 Other specified eating disorder: Secondary | ICD-10-CM | POA: Diagnosis not present

## 2024-05-13 DIAGNOSIS — Z6833 Body mass index (BMI) 33.0-33.9, adult: Secondary | ICD-10-CM

## 2024-05-13 DIAGNOSIS — E66812 Obesity, class 2: Secondary | ICD-10-CM

## 2024-05-13 DIAGNOSIS — Z6835 Body mass index (BMI) 35.0-35.9, adult: Secondary | ICD-10-CM | POA: Diagnosis not present

## 2024-05-13 DIAGNOSIS — F3289 Other specified depressive episodes: Secondary | ICD-10-CM

## 2024-05-13 DIAGNOSIS — E669 Obesity, unspecified: Secondary | ICD-10-CM

## 2024-05-13 MED ORDER — TOPIRAMATE 50 MG PO TABS: 50.0000 mg | ORAL_TABLET | Freq: Every day | ORAL | 0 refills | Status: DC

## 2024-05-13 NOTE — Progress Notes (Signed)
 Office: [phone]  /  Fax: [phone]  WEIGHT SUMMARY AND BIOMETRICS  Anthropometric Measurements Height: 5' 2 (1.575 m) Weight: 188 lb (85.3 kg) BMI (Calculated): 34.38 Weight at Last Visit: 184 lb Weight Lost Since Last Visit: 0 Weight Gained Since Last Visit: 4 lb Starting Weight: 198 lb Total Weight Loss (lbs): 10 lb (4.536 kg) Peak Weight: 225 lb   Body Composition  Body Fat %: 49.3 % Fat Mass (lbs): 92.6 lbs Muscle Mass (lbs): 90.6 lbs Total Body Water (lbs): 71.6 lbs Visceral Fat Rating : 16   Other Clinical Data Fasting: no Labs: no Today's Visit #: 49 Starting Date: 10/08/20    Chief Complaint: OBESITY    History of Present Illness Felicia Franklin is a 78 year old female who presents for a follow-up on her obesity treatment and progress.  She has been following a journaling plan with a calorie goal of 1300 calories and 75 or more grams of protein per day, but she has only been able to meet these goals about 20% of the time. She has gained four pounds in the last month since her last visit.  She engages in physical activity, exercising five to six days a week with water exercises for 100 or more minutes each session. Despite her efforts, she has experienced weight gain.  She has been off topiramate  for a month and feels it may have been more helpful than she initially thought. She is currently stable on Wellbutrin  and has a 90-day supply. She plans to restart topiramate  after her cataract surgeries, which are scheduled for next Monday and September 8th.  She is preparing for cataract surgery and is concerned about post-surgery instructions, particularly regarding lifting restrictions and water exposure, as she participates in water exercises regularly.  No recent travel or leisure activities, focusing instead on staying out of the heat and managing her health. She anticipates seeing her daughter soon and is dealing with a fallen tree limb on her  property, which her daughter is expected to help with.      PHYSICAL EXAM:  Blood pressure 131/74, pulse (!) 52, temperature 98.4 F (36.9 C), height 5' 2 (1.575 m), weight 188 lb (85.3 kg), last menstrual period 10/03/1998, SpO2 95%. Body mass index is 34.39 kg/m.  DIAGNOSTIC DATA REVIEWED:  BMET    Component Value Date/Time   NA 141 05/08/2024 1045   K 4.5 05/08/2024 1045   CL 105 05/08/2024 1045   CO2 17 (L) 05/08/2024 1045   GLUCOSE 100 (H) 05/08/2024 1045   GLUCOSE 91 11/21/2013 1310   BUN 17 05/08/2024 1045   CREATININE 0.83 05/08/2024 1045   CALCIUM 9.7 05/08/2024 1045   GFRNONAA 71 10/08/2020 0923   GFRAA 82 10/08/2020 0923   Lab Results  Component Value Date   HGBA1C 5.2 04/18/2022   HGBA1C 5.6 10/08/2020   Lab Results  Component Value Date   INSULIN  10.5 04/18/2022   INSULIN  23.3 10/08/2020   Lab Results  Component Value Date   TSH 2.190 04/18/2022   CBC    Component Value Date/Time   WBC 6.0 07/28/2023 1414   RBC 4.43 07/28/2023 1414   HGB 13.3 07/28/2023 1414   HCT 41.1 07/28/2023 1414   PLT 268 07/28/2023 1414   MCV 93 07/28/2023 1414   MCH 30.0 07/28/2023 1414   MCHC 32.4 07/28/2023 1414   RDW 13.3 07/28/2023 1414   Iron Studies No results found for: IRON, TIBC, FERRITIN, IRONPCTSAT Lipid Panel  Component Value Date/Time   CHOL 166 04/18/2022 0846   TRIG 93 04/18/2022 0846   HDL 61 04/18/2022 0846   LDLCALC 88 04/18/2022 0846   Hepatic Function Panel     Component Value Date/Time   PROT 7.2 04/18/2022 0846   ALBUMIN 4.3 04/18/2022 0846   AST 20 04/18/2022 0846   ALT 17 04/18/2022 0846   ALKPHOS 109 04/18/2022 0846   BILITOT 0.7 04/18/2022 0846      Component Value Date/Time   TSH 2.190 04/18/2022 0846   Nutritional Lab Results  Component Value Date   VD25OH 66.9 04/18/2022   VD25OH 52.1 07/12/2021   VD25OH 66.0 10/08/2020     Assessment and Plan Assessment & Plan Obesity Obesity management is ongoing.  She adheres to a journaling plan with a calorie goal of 1300 calories and 75 or more grams of protein per day, achieving these goals about 20% of the time. She exercises five to six days a week with water exercises for 100 or more minutes each session. Despite these efforts, she has gained four pounds in the last month, attributed to both water retention and actual weight gain. Medication initiation is postponed until after cataract surgery due to potential impacts on blood sugar levels and gastrointestinal motility, which could affect surgical outcomes. - Continue current exercise regimen. - Start new medication for weight management after cataract surgery.  Emotional eating behaviors Emotional eating behaviors are being addressed. She discontinued topiramate  previously, believing it was ineffective, but now considers it may have been beneficial. She is well-managed on Wellbutrin  and requests a refill. She plans to restart topiramate  post-cataract surgery, as pre-surgical instructions advise against diet medications. - Refill Wellbutrin . - Restart topiramate  after cataract surgery.      She was informed of the importance of frequent follow up visits to maximize her success with intensive lifestyle modifications for her multiple health conditions.    Louann Penton, MD

## 2024-05-20 DIAGNOSIS — H25011 Cortical age-related cataract, right eye: Secondary | ICD-10-CM | POA: Diagnosis not present

## 2024-05-20 DIAGNOSIS — H2511 Age-related nuclear cataract, right eye: Secondary | ICD-10-CM | POA: Diagnosis not present

## 2024-05-20 DIAGNOSIS — H25041 Posterior subcapsular polar age-related cataract, right eye: Secondary | ICD-10-CM | POA: Diagnosis not present

## 2024-05-21 DIAGNOSIS — H2512 Age-related nuclear cataract, left eye: Secondary | ICD-10-CM | POA: Diagnosis not present

## 2024-05-21 DIAGNOSIS — H25012 Cortical age-related cataract, left eye: Secondary | ICD-10-CM | POA: Diagnosis not present

## 2024-05-21 DIAGNOSIS — H25042 Posterior subcapsular polar age-related cataract, left eye: Secondary | ICD-10-CM | POA: Diagnosis not present

## 2024-05-30 ENCOUNTER — Other Ambulatory Visit (HOSPITAL_BASED_OUTPATIENT_CLINIC_OR_DEPARTMENT_OTHER): Payer: Self-pay | Admitting: Family

## 2024-05-30 NOTE — Telephone Encounter (Signed)
 Has not yet started Zepbound  as had to delay due to cataract surgery. Has her second surgery September 8th and plans to start Zepbound  thereafter. Plans for 2.5mg  weekly x 4 weeks then 5mg  weekly x 4 weeks. Will send in 5mg  dose for her. She will let us  know how she is doing on 5mg  dose after 2 doses and if tolerating, plan to further increase to 7.5mg  dose.   Pasty Manninen S Marchella Hibbard, NP

## 2024-06-01 DIAGNOSIS — G4733 Obstructive sleep apnea (adult) (pediatric): Secondary | ICD-10-CM | POA: Diagnosis not present

## 2024-06-04 ENCOUNTER — Other Ambulatory Visit (HOSPITAL_BASED_OUTPATIENT_CLINIC_OR_DEPARTMENT_OTHER): Payer: Self-pay | Admitting: Family

## 2024-06-08 DIAGNOSIS — G4733 Obstructive sleep apnea (adult) (pediatric): Secondary | ICD-10-CM | POA: Diagnosis not present

## 2024-06-10 DIAGNOSIS — H2512 Age-related nuclear cataract, left eye: Secondary | ICD-10-CM | POA: Diagnosis not present

## 2024-06-11 ENCOUNTER — Other Ambulatory Visit (INDEPENDENT_AMBULATORY_CARE_PROVIDER_SITE_OTHER): Payer: Self-pay | Admitting: Family Medicine

## 2024-06-17 ENCOUNTER — Ambulatory Visit (INDEPENDENT_AMBULATORY_CARE_PROVIDER_SITE_OTHER): Admitting: Family Medicine

## 2024-06-18 DIAGNOSIS — M65332 Trigger finger, left middle finger: Secondary | ICD-10-CM | POA: Diagnosis not present

## 2024-06-18 DIAGNOSIS — M79642 Pain in left hand: Secondary | ICD-10-CM | POA: Diagnosis not present

## 2024-06-25 ENCOUNTER — Other Ambulatory Visit (HOSPITAL_BASED_OUTPATIENT_CLINIC_OR_DEPARTMENT_OTHER): Payer: Self-pay

## 2024-06-25 ENCOUNTER — Encounter (HOSPITAL_BASED_OUTPATIENT_CLINIC_OR_DEPARTMENT_OTHER): Payer: Self-pay

## 2024-06-25 DIAGNOSIS — G4733 Obstructive sleep apnea (adult) (pediatric): Secondary | ICD-10-CM

## 2024-06-25 MED ORDER — ZEPBOUND 5 MG/0.5ML ~~LOC~~ SOAJ
5.0000 mg | SUBCUTANEOUS | 0 refills | Status: DC
  Filled 2024-06-25: qty 2, 28d supply, fill #0

## 2024-06-25 MED ORDER — ZEPBOUND 5 MG/0.5ML ~~LOC~~ SOAJ
5.0000 mg | SUBCUTANEOUS | 0 refills | Status: DC
Start: 2024-06-25 — End: 2024-08-12

## 2024-07-02 DIAGNOSIS — G4733 Obstructive sleep apnea (adult) (pediatric): Secondary | ICD-10-CM | POA: Diagnosis not present

## 2024-07-10 DIAGNOSIS — Z1231 Encounter for screening mammogram for malignant neoplasm of breast: Secondary | ICD-10-CM | POA: Diagnosis not present

## 2024-07-15 ENCOUNTER — Encounter (INDEPENDENT_AMBULATORY_CARE_PROVIDER_SITE_OTHER): Payer: Self-pay | Admitting: Family Medicine

## 2024-07-15 ENCOUNTER — Ambulatory Visit (INDEPENDENT_AMBULATORY_CARE_PROVIDER_SITE_OTHER): Admitting: Family Medicine

## 2024-07-15 VITALS — BP 123/73 | HR 64 | Temp 98.7°F | Ht 62.0 in | Wt 186.0 lb

## 2024-07-15 DIAGNOSIS — Z6834 Body mass index (BMI) 34.0-34.9, adult: Secondary | ICD-10-CM

## 2024-07-15 DIAGNOSIS — F5089 Other specified eating disorder: Secondary | ICD-10-CM

## 2024-07-15 DIAGNOSIS — I10 Essential (primary) hypertension: Secondary | ICD-10-CM | POA: Diagnosis not present

## 2024-07-15 DIAGNOSIS — E669 Obesity, unspecified: Secondary | ICD-10-CM | POA: Diagnosis not present

## 2024-07-15 DIAGNOSIS — F3289 Other specified depressive episodes: Secondary | ICD-10-CM

## 2024-07-15 MED ORDER — TOPIRAMATE 50 MG PO TABS: 50.0000 mg | ORAL_TABLET | Freq: Every day | ORAL | 0 refills | Status: DC

## 2024-07-15 NOTE — Progress Notes (Signed)
 Office: [phone]  /  Fax: [phone]  WEIGHT SUMMARY AND BIOMETRICS  Anthropometric Measurements Height: 5' 2 (1.575 m) Weight: 186 lb (84.4 kg) BMI (Calculated): 34.01 Weight at Last Visit: 188lb Weight Lost Since Last Visit: 2lb Weight Gained Since Last Visit: 0lb Starting Weight: 198lb Total Weight Loss (lbs): 12 lb (5.443 kg) Peak Weight: 225lb Waist Measurement : 52 inches   Body Composition  Body Fat %: 48.1 % Fat Mass (lbs): 89.6 lbs Muscle Mass (lbs): 91.6 lbs Total Body Water (lbs): 67.6 lbs Visceral Fat Rating : 15   Other Clinical Data Fasting: No Labs: no Today's Visit #: 50 Starting Date: 01/06/21    Chief Complaint: OBESITY   Discussed the use of AI scribe software for clinical note transcription with the patient, who gave verbal consent to proceed.  History of Present Illness Felicia Franklin is a 78 year old female with obesity and hypertension who presents for obesity treatment plan assessment and progress evaluation.  She has been prescribed a category two eating plan but has only been able to follow it about twenty percent of the time. She exercises six days a week, two hours per session, in water exercise classes and has lost two pounds in the last two months. She is currently on Zepbound  at a dose of five mg for weight loss, having started with a dose of 2.5 mg and has taken one dose of the 5 mg so far. She sometimes skips lunch in favor of an early dinner, especially if she has been busy with errands and appointments. She ensures she does not eat too close to bedtime to avoid heartburn. She is considering resuming topiramate , which she stopped taking in mid-August before her cataract surgery.  She has a history of hypertension and is currently taking amlodipine 10 mg and nebivolol  20 mg.  She underwent cataract surgery, which kept her out of water exercise for four weeks. Post-surgery, her left eye became sensitive to prednisolone,  causing haziness and sensitivity. She was prescribed drops typically used for glaucoma, although it was confirmed not to be glaucoma. Her left eye has improved, but she will see the surgeon tomorrow for further evaluation.  She participates in water exercise classes led by a young instructor named Sidra, who is adapting to teaching older adults.      PHYSICAL EXAM:  Blood pressure 123/73, pulse 64, temperature 98.7 F (37.1 C), height 5' 2 (1.575 m), weight 186 lb (84.4 kg), last menstrual period 10/03/1998, SpO2 97%. Body mass index is 34.02 kg/m.  DIAGNOSTIC DATA REVIEWED:  BMET    Component Value Date/Time   NA 141 05/08/2024 1045   K 4.5 05/08/2024 1045   CL 105 05/08/2024 1045   CO2 17 (L) 05/08/2024 1045   GLUCOSE 100 (H) 05/08/2024 1045   GLUCOSE 91 11/21/2013 1310   BUN 17 05/08/2024 1045   CREATININE 0.83 05/08/2024 1045   CALCIUM 9.7 05/08/2024 1045   GFRNONAA 71 10/08/2020 0923   GFRAA 82 10/08/2020 0923   Lab Results  Component Value Date   HGBA1C 5.2 04/18/2022   HGBA1C 5.6 10/08/2020   Lab Results  Component Value Date   INSULIN  10.5 04/18/2022   INSULIN  23.3 10/08/2020   Lab Results  Component Value Date   TSH 2.190 04/18/2022   CBC    Component Value Date/Time   WBC 6.0 07/28/2023 1414   RBC 4.43 07/28/2023 1414   HGB 13.3 07/28/2023 1414   HCT 41.1 07/28/2023 1414  PLT 268 07/28/2023 1414   MCV 93 07/28/2023 1414   MCH 30.0 07/28/2023 1414   MCHC 32.4 07/28/2023 1414   RDW 13.3 07/28/2023 1414   Iron Studies No results found for: IRON, TIBC, FERRITIN, IRONPCTSAT Lipid Panel     Component Value Date/Time   CHOL 166 04/18/2022 0846   TRIG 93 04/18/2022 0846   HDL 61 04/18/2022 0846   LDLCALC 88 04/18/2022 0846   Hepatic Function Panel     Component Value Date/Time   PROT 7.2 04/18/2022 0846   ALBUMIN 4.3 04/18/2022 0846   AST 20 04/18/2022 0846   ALT 17 04/18/2022 0846   ALKPHOS 109 04/18/2022 0846   BILITOT 0.7  04/18/2022 0846      Component Value Date/Time   TSH 2.190 04/18/2022 0846   Nutritional Lab Results  Component Value Date   VD25OH 66.9 04/18/2022   VD25OH 52.1 07/12/2021   VD25OH 66.0 10/08/2020     Assessment and Plan Assessment & Plan Obesity with body mass index (BMI) 34.0-34.9, adult Obesity with BMI in the range of 34.0-34.9. She has lost 2 pounds in the last two months. Currently on Zepbound  5 mg for weight loss. She is exercising six days a week, two hours per session in water exercise classes. She has not been able to follow the category two eating plan closely, adhering only about 20% of the time. Discussed the importance of protein intake to maintain muscle mass during weight loss. Recommended protein intake is 1 to 1.2 grams per kilogram of body weight, which is approximately 85 grams for her. Discussed the use of protein shakes as a convenient option to meet protein needs when meals are skipped. Emphasized the importance of not skipping meals to prevent muscle loss. - Continue Zepbound  5 mg. - Encourage adherence to the category two eating plan. - Recommend protein intake of 85 grams per day. - Suggest using Premier Protein or [employer]life protein drinks as meal replacements when necessary. - Encourage continuation of current exercise regimen.  Emotional eating behaviors Emotional eating behaviors noted. She had stopped taking topiramate  before her cataract surgery and forgot to resume it. No issues reported with topiramate  previously. Discussed the role of topiramate  in managing emotional eating behaviors and the importance of not skipping meals to support weight loss efforts. - Restart topiramate . - Monitor emotional eating behaviors and adjust treatment as needed.  Essential hypertension Essential hypertension is well-controlled with current medications. Blood pressure today is 123/73 mmHg. She is currently on amlodipine 10 mg and nebivolol  20 mg. - Continue current  medications: amlodipine 10 mg and nebivolol  20 mg.  .    Eldonna was informed of the importance of frequent follow up visits to maximize her success with intensive lifestyle modifications for her obesity and obesity related health conditions as recommended by USPSTF and CMS guidelines   Louann Penton, MD

## 2024-07-29 DIAGNOSIS — Z6835 Body mass index (BMI) 35.0-35.9, adult: Secondary | ICD-10-CM | POA: Diagnosis not present

## 2024-07-29 DIAGNOSIS — M154 Erosive (osteo)arthritis: Secondary | ICD-10-CM | POA: Diagnosis not present

## 2024-07-29 DIAGNOSIS — M059 Rheumatoid arthritis with rheumatoid factor, unspecified: Secondary | ICD-10-CM | POA: Diagnosis not present

## 2024-07-29 DIAGNOSIS — Z79899 Other long term (current) drug therapy: Secondary | ICD-10-CM | POA: Diagnosis not present

## 2024-07-29 DIAGNOSIS — E669 Obesity, unspecified: Secondary | ICD-10-CM | POA: Diagnosis not present

## 2024-08-05 ENCOUNTER — Other Ambulatory Visit (HOSPITAL_BASED_OUTPATIENT_CLINIC_OR_DEPARTMENT_OTHER): Payer: Self-pay | Admitting: Family

## 2024-08-05 DIAGNOSIS — G4733 Obstructive sleep apnea (adult) (pediatric): Secondary | ICD-10-CM

## 2024-08-10 ENCOUNTER — Other Ambulatory Visit (HOSPITAL_BASED_OUTPATIENT_CLINIC_OR_DEPARTMENT_OTHER): Payer: Self-pay | Admitting: Family

## 2024-09-02 ENCOUNTER — Ambulatory Visit (INDEPENDENT_AMBULATORY_CARE_PROVIDER_SITE_OTHER): Admitting: Family Medicine

## 2024-09-03 ENCOUNTER — Ambulatory Visit (INDEPENDENT_AMBULATORY_CARE_PROVIDER_SITE_OTHER): Payer: Self-pay | Admitting: Family Medicine

## 2024-09-03 ENCOUNTER — Encounter (INDEPENDENT_AMBULATORY_CARE_PROVIDER_SITE_OTHER): Payer: Self-pay | Admitting: Family Medicine

## 2024-09-03 VITALS — BP 129/74 | HR 63 | Temp 97.7°F | Ht 62.0 in | Wt 180.0 lb

## 2024-09-03 DIAGNOSIS — F3289 Other specified depressive episodes: Secondary | ICD-10-CM

## 2024-09-03 DIAGNOSIS — Z6832 Body mass index (BMI) 32.0-32.9, adult: Secondary | ICD-10-CM

## 2024-09-03 DIAGNOSIS — E669 Obesity, unspecified: Secondary | ICD-10-CM | POA: Diagnosis not present

## 2024-09-03 DIAGNOSIS — F32A Depression, unspecified: Secondary | ICD-10-CM

## 2024-09-03 DIAGNOSIS — R7303 Prediabetes: Secondary | ICD-10-CM | POA: Diagnosis not present

## 2024-09-03 DIAGNOSIS — Z6834 Body mass index (BMI) 34.0-34.9, adult: Secondary | ICD-10-CM

## 2024-09-03 MED ORDER — BUPROPION HCL ER (SR) 150 MG PO TB12: 150.0000 mg | ORAL_TABLET | Freq: Two times a day (BID) | ORAL | 0 refills | Status: AC

## 2024-09-03 MED ORDER — TOPIRAMATE 50 MG PO TABS: 50.0000 mg | ORAL_TABLET | Freq: Every day | ORAL | 0 refills | Status: DC

## 2024-09-03 NOTE — Progress Notes (Signed)
 Office: [phone]  /  Fax: [phone]  WEIGHT SUMMARY AND BIOMETRICS  Anthropometric Measurements Height: 5' 2 (1.575 m) Weight: 180 lb (81.6 kg) BMI (Calculated): 32.91 Weight at Last Visit: 186 lb Weight Lost Since Last Visit: 6 lb Weight Gained Since Last Visit: 0 Starting Weight: 198 lb Total Weight Loss (lbs): 18 lb (8.165 kg) Peak Weight: 225 lb Waist Measurement : 52 inches   Body Composition  Body Fat %: 47.8 % Fat Mass (lbs): 86.2 lbs Muscle Mass (lbs): 89.2 lbs Total Body Water (lbs): 67.4 lbs Visceral Fat Rating : 15   Other Clinical Data Fasting: no Labs: no Today's Visit #: 51 Starting Date: 01/06/21    Chief Complaint: OBESITY   History of Present Illness Felicia Franklin is a 78 year old female with obesity, obstructive sleep apnea, and prediabetes who presents for obesity treatment and progress assessment.  She adheres to the category two eating plan approximately 20% of the time and engages in physical activity five days a week, primarily swimming and water exercises for about an hour each session. She has achieved a weight loss of six pounds over the past six weeks.  She is currently on topiramate  50 mg and Wellbutrin  SR 150 mg twice daily to manage emotional eating behaviors and requests refills for both medications. No issues have been reported with these medications.  Her obstructive sleep apnea is managed with Zepbound  5 mg weekly, prescribed through cardiology, and she reports no problems with this treatment.  She has prediabetes and is working on diet and exercise and weight loss to control this. Her GLP-1 is likely also helping.  Socially, she is unable to walk a dog in her neighborhood due to physical limitations, and her yard is not suitable for all dogs. 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      PHYSICAL EXAM:  Blood pressure 129/74, pulse 63, temperature 97.7 F  (36.5 C), height 5' 2 (1.575 m), weight 180 lb (81.6 kg), last menstrual period 10/03/1998, SpO2 96%. Body mass index is 32.92 kg/m.  DIAGNOSTIC DATA REVIEWED:  BMET    Component Value Date/Time   NA 141 05/08/2024 1045   K 4.5 05/08/2024 1045   CL 105 05/08/2024 1045   CO2 17 (L) 05/08/2024 1045   GLUCOSE 100 (H) 05/08/2024 1045   GLUCOSE 91 11/21/2013 1310   BUN 17 05/08/2024 1045   CREATININE 0.83 05/08/2024 1045   CALCIUM 9.7 05/08/2024 1045   GFRNONAA 71 10/08/2020 0923   GFRAA 82 10/08/2020 0923   Lab Results  Component Value Date   HGBA1C 5.2 04/18/2022   HGBA1C 5.6 10/08/2020   Lab Results  Component Value Date   INSULIN  10.5 04/18/2022   INSULIN  23.3 10/08/2020   Lab Results  Component Value Date   TSH 2.190 04/18/2022   CBC    Component Value Date/Time   WBC 6.0 07/28/2023 1414   RBC 4.43 07/28/2023 1414   HGB 13.3 07/28/2023 1414   HCT 41.1 07/28/2023 1414   PLT 268 07/28/2023 1414   MCV 93 07/28/2023 1414   MCH 30.0 07/28/2023 1414   MCHC 32.4 07/28/2023 1414   RDW 13.3 07/28/2023 1414   Iron Studies No results found for: IRON, TIBC, FERRITIN, IRONPCTSAT Lipid Panel     Component Value Date/Time   CHOL 166 04/18/2022 0846   TRIG 93 04/18/2022 0846   HDL 61 04/18/2022 0846   LDLCALC 88 04/18/2022  [id number]   Hepatic Function Panel     Component Value Date/Time   PROT 7.2 04/18/2022 0846   ALBUMIN 4.3 04/18/2022 0846   AST 20 04/18/2022 0846   ALT 17 04/18/2022 0846   ALKPHOS 109 04/18/2022 0846   BILITOT 0.7 04/18/2022 0846      Component Value Date/Time   TSH 2.190 04/18/2022 0846   Nutritional Lab Results  Component Value Date   VD25OH 66.9 04/18/2022   VD25OH 52.1 07/12/2021   VD25OH 66.0 10/08/2020     Assessment and Plan Assessment & Plan Obesity  Obesity management is ongoing with a focus on weight loss. She has lost six pounds in the last six weeks, indicating progress. She is following a category two eating  plan about 20% of the time and exercises five days a week with swimming and water exercises. She is on topiramate  50 mg and Wellbutrin  SR 150 mg twice a day for emotional eating behaviors. No issues reported with these medications. She is also on Zepbound  5 mg weekly for obstructive sleep apnea, managed by cardiology. No need to increase the dose of Zepbound  as she is progressing well. Emphasis on maintaining proper nutrition to avoid muscle wasting and other health issues associated with inadequate nutrition during weight loss. - Continue topiramate  50 mg for emotional eating behaviors. - Continue Wellbutrin  SR 150 mg twice a day for emotional eating behaviors. - Continue Zepbound  5 mg weekly for obstructive sleep apnea. - Encouraged adherence to the category two eating plan. - Encouraged continuation of exercise regimen.  Prediabetes Present and is expected to improve with weight loss and current treatment regimen. - Continue current weight loss efforts and treatment regimen.  Depression Managed with Wellbutrin  SR 150 mg twice a day. No issues reported with the medication. - Continue Wellbutrin  SR 150 mg twice a day.      Patients who are on anti-obesity medications are counseled on the importance of maintaining healthy lifestyle habits, including balanced nutrition, regular physical activity, and behavioral modifications,  Medication is an adjunct to, not a replacement for, lifestyle changes and that the long-term success and weight maintenance depend on continued adherence to these strategies.   Felicia Franklin was informed of the importance of frequent follow up visits to maximize her success with intensive lifestyle modifications for her obesity and obesity related health conditions as recommended by USPSTF and CMS guidelines  Louann Penton, MD

## 2024-09-10 ENCOUNTER — Other Ambulatory Visit (HOSPITAL_BASED_OUTPATIENT_CLINIC_OR_DEPARTMENT_OTHER): Payer: Self-pay | Admitting: Family

## 2024-09-10 DIAGNOSIS — G4733 Obstructive sleep apnea (adult) (pediatric): Secondary | ICD-10-CM

## 2024-09-11 ENCOUNTER — Encounter (HOSPITAL_BASED_OUTPATIENT_CLINIC_OR_DEPARTMENT_OTHER): Payer: Self-pay | Admitting: Cardiovascular Disease

## 2024-09-18 NOTE — Telephone Encounter (Signed)
 Patient wants to stay at 5mg  for now. She will call when she wants to increase.

## 2024-10-05 ENCOUNTER — Other Ambulatory Visit: Payer: Self-pay | Admitting: Cardiovascular Disease

## 2024-10-05 DIAGNOSIS — I480 Paroxysmal atrial fibrillation: Secondary | ICD-10-CM

## 2024-10-07 NOTE — Telephone Encounter (Signed)
 Pt last saw Reche Finder, NP on 04/22/24, last labs 05/08/24 Creat 0.83, age 79, weight 81.6kg, based on specified criteria pt is on appropriate dosage of Eliquis  5mg  BID for afib.  Will refill rx.

## 2024-10-21 ENCOUNTER — Ambulatory Visit (INDEPENDENT_AMBULATORY_CARE_PROVIDER_SITE_OTHER): Admitting: Family Medicine

## 2024-10-21 ENCOUNTER — Encounter (INDEPENDENT_AMBULATORY_CARE_PROVIDER_SITE_OTHER): Payer: Self-pay | Admitting: Family Medicine

## 2024-10-21 VITALS — BP 120/74 | HR 64 | Temp 97.8°F | Ht 62.0 in | Wt 178.0 lb

## 2024-10-21 DIAGNOSIS — F5089 Other specified eating disorder: Secondary | ICD-10-CM | POA: Diagnosis not present

## 2024-10-21 DIAGNOSIS — Z6832 Body mass index (BMI) 32.0-32.9, adult: Secondary | ICD-10-CM

## 2024-10-21 DIAGNOSIS — E669 Obesity, unspecified: Secondary | ICD-10-CM

## 2024-10-21 DIAGNOSIS — G4733 Obstructive sleep apnea (adult) (pediatric): Secondary | ICD-10-CM

## 2024-10-21 DIAGNOSIS — F3289 Other specified depressive episodes: Secondary | ICD-10-CM

## 2024-10-21 MED ORDER — TOPIRAMATE 50 MG PO TABS: 50.0000 mg | ORAL_TABLET | Freq: Every day | ORAL | 0 refills | Status: AC

## 2024-10-21 NOTE — Progress Notes (Signed)
 "  Office: [phone]  /  Fax: [phone]  WEIGHT SUMMARY AND BIOMETRICS  Anthropometric Measurements Height: 5' 2 (1.575 m) Weight: 178 lb (80.7 kg) BMI (Calculated): 32.55 Weight at Last Visit: 180 lb Weight Lost Since Last Visit: 2 lb Weight Gained Since Last Visit: 0 Starting Weight: 198 lb Total Weight Loss (lbs): 20 lb (9.072 kg) Peak Weight: 225 lb Waist Measurement : 52 inches   Body Composition  Body Fat %: 48.7 % Fat Mass (lbs): 87 lbs Muscle Mass (lbs): 87.2 lbs Total Body Water (lbs): 68.2 lbs Visceral Fat Rating : 15   Other Clinical Data Fasting: no Labs: no Today's Visit #: 52 Starting Date: 10/08/20    Chief Complaint: OBESITY    History of Present Illness Felicia Franklin is a 79 year old female who presents for obesity treatment and progress assessment.  She is following the category two eating plan but adheres to it about 25% of the time. She is working on increasing her protein intake and feels she is getting closer to the recommended amount. She is also consuming more fruits and vegetables and is trying to maintain adequate hydration. She has resumed her water exercises, performing them five days a week for almost two hours each session. She has lost two pounds in the last month, including over the holiday season.  She is currently taking topiramate  50 mg to help with emotional eating behaviors. She is also on Wellbutrin  for the same purpose and reports doing well with it. Her cravings are at a 'normal level,' which she describes as 'kind of high.'  She experienced an illness over the holidays with symptoms including a cough and wheezing, which were new to her. She was tested for flu and COVID, both of which were negative. She was prescribed albuterol , which did not alleviate her symptoms. She experienced significant wheezing, especially at night, affecting her sleep quality. No fever or chills were present, and she describes the illness as  a cold.  She is using Zepbound , which she feels is not affecting her appetite, although it may have a subtle effect. She was using her CPAP regularly until she got sick, which caused her to stop due to nasal congestion and the need for thorough cleaning.  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      PHYSICAL EXAM:  Blood pressure 120/74, pulse 64, temperature 97.8 F (36.6 C), height 5' 2 (1.575 m), weight 178 lb (80.7 kg), last menstrual period 10/03/1998, SpO2 95%. Body mass index is 32.56 kg/m.  DIAGNOSTIC DATA REVIEWED BY MYSELF TODAY:  BMET    Component Value Date/Time   NA 141 05/08/2024 1045   K 4.5 05/08/2024 1045   CL 105 05/08/2024 1045   CO2 17 (L) 05/08/2024 1045   GLUCOSE 100 (H) 05/08/2024 1045   GLUCOSE 91 11/21/2013 1310   BUN 17 05/08/2024 1045   CREATININE 0.83 05/08/2024 1045   CALCIUM 9.7 05/08/2024 1045   GFRNONAA 71 10/08/2020 0923   GFRAA 82 10/08/2020 0923   Lab Results  Component Value Date   HGBA1C 5.2 04/18/2022   HGBA1C 5.6 10/08/2020   Lab Results  Component Value Date   INSULIN  10.5 04/18/2022   INSULIN  23.3 10/08/2020   Lab Results  Component Value Date   TSH 2.190 04/18/2022  CBC    Component Value Date/Time   WBC 6.0 07/28/2023 1414   RBC 4.43 07/28/2023 1414   HGB 13.3 07/28/2023 1414   HCT 41.1 07/28/2023 1414   PLT 268 07/28/2023 1414   MCV 93 07/28/2023 1414   MCH 30.0 07/28/2023 1414   MCHC 32.4 07/28/2023 1414   RDW 13.3 07/28/2023 1414   Iron Studies No results found for: IRON, TIBC, FERRITIN, IRONPCTSAT Lipid Panel     Component Value Date/Time   CHOL 166 04/18/2022 0846   TRIG 93 04/18/2022 0846   HDL 61 04/18/2022 0846   LDLCALC 88 04/18/2022 0846   Hepatic Function Panel     Component Value Date/Time   PROT  7.2 04/18/2022 0846   ALBUMIN 4.3 04/18/2022 0846   AST 20 04/18/2022 0846   ALT 17 04/18/2022 0846   ALKPHOS 109 04/18/2022 0846   BILITOT 0.7 04/18/2022 0846      Component Value Date/Time   TSH 2.190 04/18/2022 0846   Nutritional Lab Results  Component Value Date   VD25OH 66.9 04/18/2022   VD25OH 52.1 07/12/2021   VD25OH 66.0 10/08/2020     Assessment and Plan Assessment & Plan Generalized obesity She has lost two pounds in the last month, including over the holidays, which is commendable. She is following the category two eating plan about 25% of the time, working on increasing protein intake, and consuming more fruits and vegetables. She is hydrating adequately and engaging in water exercises five days a week for almost two hours at a time. She is on Zepbound , which is not significantly affecting her appetite but is being managed by cardiology for sleep apnea. - Continue Zepbound  at 5 mg. - Continue diet, exercise, and weight loss efforts, Cat 2 eating plan.  Obstructive sleep apnea She has been using her CPAP regularly until she got sick, after which she stopped due to nasal congestion and lack of time to clean the equipment. She is now feeling better and could resume CPAP use. She is on Zepbound , which is technically prescribed for sleep apnea management. - Encouraged regular use of CPAP now that she is feeling better. - Continue Zepbound  5 mg - Continue diet, exercise and weight loss as discussed today as an important part of the treatment plan   Emotional eating behaviors She is on topiramate  and Wellbutrin  for emotional eating behaviors and reports doing well on these medications. She requests a refill of topiramate  and is tolerating it well without any issues. Her cravings are at her normal level, which she describes as high. - Refilled topiramate  prescription. - Continue Wellbutrin  for emotional eating behaviors.      Patients who are on anti-obesity medications  are counseled on the importance of maintaining healthy lifestyle habits, including balanced nutrition, regular physical activity, and behavioral modifications,  Medication is an adjunct to, not a replacement for, lifestyle changes and that the long-term success and weight maintenance depend on continued adherence to these strategies.   Felicia Franklin was informed of the importance of frequent follow up visits to maximize her success with intensive lifestyle modifications for her obesity and obesity related health conditions as recommended by USPSTF and CMS guidelines  Louann Penton, MD   "

## 2024-11-19 ENCOUNTER — Ambulatory Visit (INDEPENDENT_AMBULATORY_CARE_PROVIDER_SITE_OTHER): Admitting: Family Medicine

## 2024-12-18 ENCOUNTER — Ambulatory Visit (INDEPENDENT_AMBULATORY_CARE_PROVIDER_SITE_OTHER): Admitting: Family Medicine
